# Patient Record
Sex: Female | Born: 1965 | Race: Black or African American | Hispanic: No | Smoking: Current every day smoker
Health system: Southern US, Community
[De-identification: ages and names within clinical notes are randomized; demographics above are authoritative.]

## PROBLEM LIST (undated history)

## (undated) DIAGNOSIS — I10 Essential (primary) hypertension: Secondary | ICD-10-CM

## (undated) DIAGNOSIS — D473 Essential (hemorrhagic) thrombocythemia: Secondary | ICD-10-CM

## (undated) DIAGNOSIS — D75839 Thrombocytosis, unspecified: Secondary | ICD-10-CM

## (undated) DIAGNOSIS — E539 Vitamin B deficiency, unspecified: Secondary | ICD-10-CM

## (undated) DIAGNOSIS — F32A Depression, unspecified: Secondary | ICD-10-CM

## (undated) DIAGNOSIS — Z8041 Family history of malignant neoplasm of ovary: Secondary | ICD-10-CM

## (undated) DIAGNOSIS — D649 Anemia, unspecified: Secondary | ICD-10-CM

## (undated) DIAGNOSIS — E559 Vitamin D deficiency, unspecified: Secondary | ICD-10-CM

## (undated) DIAGNOSIS — F329 Major depressive disorder, single episode, unspecified: Secondary | ICD-10-CM

## (undated) HISTORY — DX: Vitamin D deficiency, unspecified: E55.9

## (undated) HISTORY — DX: Major depressive disorder, single episode, unspecified: F32.9

## (undated) HISTORY — DX: Depression, unspecified: F32.A

## (undated) HISTORY — DX: Thrombocytosis, unspecified: D75.839

## (undated) HISTORY — DX: Family history of malignant neoplasm of ovary: Z80.41

## (undated) HISTORY — DX: Vitamin B deficiency, unspecified: E53.9

## (undated) HISTORY — DX: Essential (primary) hypertension: I10

---
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
## 1989-06-10 HISTORY — PX: CHOLECYSTECTOMY: SHX55

## 1998-06-10 HISTORY — PX: REDUCTION MAMMAPLASTY: SUR839

## 2000-06-10 HISTORY — PX: BREAST SURGERY: SHX581

## 2006-02-12 ENCOUNTER — Ambulatory Visit: Payer: Self-pay | Admitting: Family Medicine

## 2006-08-12 ENCOUNTER — Ambulatory Visit: Payer: Self-pay | Admitting: Family Medicine

## 2008-03-10 ENCOUNTER — Emergency Department: Payer: Self-pay | Admitting: Emergency Medicine

## 2008-12-14 ENCOUNTER — Ambulatory Visit (HOSPITAL_COMMUNITY): Admission: RE | Admit: 2008-12-14 | Discharge: 2008-12-14 | Payer: Self-pay | Admitting: Family Medicine

## 2009-02-03 DIAGNOSIS — F341 Dysthymic disorder: Secondary | ICD-10-CM | POA: Insufficient documentation

## 2009-02-08 ENCOUNTER — Ambulatory Visit: Payer: Self-pay | Admitting: Internal Medicine

## 2009-02-24 ENCOUNTER — Ambulatory Visit: Payer: Self-pay | Admitting: Internal Medicine

## 2009-03-03 DIAGNOSIS — D473 Essential (hemorrhagic) thrombocythemia: Secondary | ICD-10-CM | POA: Insufficient documentation

## 2009-03-10 ENCOUNTER — Ambulatory Visit: Payer: Self-pay | Admitting: Internal Medicine

## 2009-03-13 ENCOUNTER — Emergency Department: Payer: Self-pay | Admitting: Emergency Medicine

## 2009-04-10 ENCOUNTER — Ambulatory Visit: Payer: Self-pay | Admitting: Internal Medicine

## 2009-05-03 DIAGNOSIS — I10 Essential (primary) hypertension: Secondary | ICD-10-CM | POA: Insufficient documentation

## 2009-05-09 HISTORY — PX: SHOULDER SURGERY: SHX246

## 2009-05-10 ENCOUNTER — Ambulatory Visit: Payer: Self-pay | Admitting: Internal Medicine

## 2009-05-19 ENCOUNTER — Encounter: Payer: Self-pay | Admitting: Orthopedic Surgery

## 2009-06-10 ENCOUNTER — Encounter: Payer: Self-pay | Admitting: Orthopedic Surgery

## 2009-06-10 ENCOUNTER — Ambulatory Visit: Payer: Self-pay | Admitting: Internal Medicine

## 2009-07-11 ENCOUNTER — Ambulatory Visit: Payer: Self-pay | Admitting: Internal Medicine

## 2009-07-11 ENCOUNTER — Encounter: Payer: Self-pay | Admitting: Orthopedic Surgery

## 2009-08-08 ENCOUNTER — Encounter: Payer: Self-pay | Admitting: Orthopedic Surgery

## 2009-08-08 ENCOUNTER — Ambulatory Visit: Payer: Self-pay | Admitting: Internal Medicine

## 2009-09-08 ENCOUNTER — Ambulatory Visit: Payer: Self-pay | Admitting: Internal Medicine

## 2009-09-08 ENCOUNTER — Encounter: Payer: Self-pay | Admitting: Orthopedic Surgery

## 2010-10-06 ENCOUNTER — Inpatient Hospital Stay: Payer: Self-pay | Admitting: Psychiatry

## 2011-12-09 ENCOUNTER — Ambulatory Visit: Payer: Self-pay | Admitting: Family Medicine

## 2013-06-07 ENCOUNTER — Emergency Department: Payer: Self-pay | Admitting: Emergency Medicine

## 2013-06-07 LAB — BASIC METABOLIC PANEL
BUN: 5 mg/dL — ABNORMAL LOW (ref 7–18)
Calcium, Total: 9.5 mg/dL (ref 8.5–10.1)
Chloride: 106 mmol/L (ref 98–107)
Creatinine: 0.63 mg/dL (ref 0.60–1.30)
EGFR (African American): >60
Potassium: 4.1 mmol/L (ref 3.5–5.1)

## 2013-06-07 LAB — CBC
HGB: 11.8 g/dL — ABNORMAL LOW (ref 12.0–16.0)
MCV: 78 fL — ABNORMAL LOW (ref 80–100)
RDW: 17.9 % — ABNORMAL HIGH (ref 11.5–14.5)

## 2013-06-07 LAB — TROPONIN I: Troponin-I: <0.02 ng/mL

## 2014-03-30 LAB — HEMOGLOBIN A1C: HEMOGLOBIN A1C: 6 % (ref 4.0–6.0)

## 2014-12-01 DIAGNOSIS — N926 Irregular menstruation, unspecified: Secondary | ICD-10-CM | POA: Insufficient documentation

## 2014-12-01 DIAGNOSIS — D519 Vitamin B12 deficiency anemia, unspecified: Secondary | ICD-10-CM | POA: Insufficient documentation

## 2014-12-01 DIAGNOSIS — A5900 Urogenital trichomoniasis, unspecified: Secondary | ICD-10-CM | POA: Insufficient documentation

## 2014-12-01 DIAGNOSIS — E559 Vitamin D deficiency, unspecified: Secondary | ICD-10-CM | POA: Insufficient documentation

## 2014-12-01 DIAGNOSIS — R739 Hyperglycemia, unspecified: Secondary | ICD-10-CM | POA: Insufficient documentation

## 2014-12-01 DIAGNOSIS — D509 Iron deficiency anemia, unspecified: Secondary | ICD-10-CM | POA: Insufficient documentation

## 2014-12-02 ENCOUNTER — Encounter: Payer: Self-pay | Admitting: Family Medicine

## 2014-12-02 ENCOUNTER — Ambulatory Visit (INDEPENDENT_AMBULATORY_CARE_PROVIDER_SITE_OTHER): Payer: 59 | Admitting: Family Medicine

## 2014-12-02 VITALS — BP 152/90 | HR 76 | Temp 98.4°F | Resp 16 | Ht 68.0 in | Wt 231.0 lb

## 2014-12-02 DIAGNOSIS — R202 Paresthesia of skin: Secondary | ICD-10-CM

## 2014-12-02 LAB — POCT GLYCOSYLATED HEMOGLOBIN (HGB A1C): Hemoglobin A1C: 5.6

## 2014-12-02 NOTE — Patient Instructions (Signed)
F/u with podiatry as scheduled

## 2014-12-02 NOTE — Progress Notes (Signed)
Subjective:     Patient ID: Anna Bates, female   DOB: [birth year], 49 y.o.   MRN: [id number]  HPI  Chief Complaint  Patient presents with  . Foot Problem    Right foot pain x several months, intermittently. Pt states there has been associated swelling. States it's a "needles and pins" feeling.  States she has appointment pending with podiatry next week ([place]). Reports hx of pre-diabetes. Reports minimal relief with ibuprofen.   Review of Systems  Musculoskeletal:       Reports hx of right mid-foot fracture       Objective:   Physical Exam  Constitutional: She appears well-developed and well-nourished. No distress.  Cardiovascular:  Right pedal pulses intact - Musculoskeletal: She exhibits no edema or tenderness (DF/PF 5/5, ankle ligaments stable).  SLR to 90 degrees without discomfort  Neurological:  Sensation to PP diminished in her right lateral foot from the third toe laterally       Assessment:     1. Paresthesia of right foot - POCT HgB A1C    Plan:    Proceed with podiatry evaluation

## 2014-12-08 ENCOUNTER — Ambulatory Visit: Payer: 59 | Admitting: Podiatry

## 2015-03-29 ENCOUNTER — Ambulatory Visit: Payer: 59

## 2015-04-15 ENCOUNTER — Encounter: Payer: Self-pay | Admitting: Emergency Medicine

## 2015-04-15 ENCOUNTER — Emergency Department
Admission: EM | Admit: 2015-04-15 | Discharge: 2015-04-15 | Disposition: A | Discharge status: Home / Self Care | Payer: PRIVATE HEALTH INSURANCE | Attending: Emergency Medicine | Admitting: Emergency Medicine

## 2015-04-15 DIAGNOSIS — Y9389 Activity, other specified: Secondary | ICD-10-CM | POA: Diagnosis not present

## 2015-04-15 DIAGNOSIS — X58XXXA Exposure to other specified factors, initial encounter: Secondary | ICD-10-CM | POA: Diagnosis not present

## 2015-04-15 DIAGNOSIS — S46001A Unspecified injury of muscle(s) and tendon(s) of the rotator cuff of right shoulder, initial encounter: Secondary | ICD-10-CM | POA: Diagnosis not present

## 2015-04-15 DIAGNOSIS — Z720 Tobacco use: Secondary | ICD-10-CM | POA: Diagnosis not present

## 2015-04-15 DIAGNOSIS — Y9289 Other specified places as the place of occurrence of the external cause: Secondary | ICD-10-CM | POA: Diagnosis not present

## 2015-04-15 DIAGNOSIS — S4991XA Unspecified injury of right shoulder and upper arm, initial encounter: Secondary | ICD-10-CM | POA: Diagnosis present

## 2015-04-15 DIAGNOSIS — Y998 Other external cause status: Secondary | ICD-10-CM | POA: Insufficient documentation

## 2015-04-15 DIAGNOSIS — I10 Essential (primary) hypertension: Secondary | ICD-10-CM | POA: Diagnosis not present

## 2015-04-15 MED ORDER — TRAMADOL HCL 50 MG PO TABS: 50.0000 mg | ORAL_TABLET | Freq: Four times a day (QID) | ORAL | Status: DC | PRN

## 2015-04-15 MED ORDER — PREDNISONE 10 MG (21) PO TBPK: ORAL_TABLET | ORAL | Status: DC

## 2015-04-15 NOTE — ED Notes (Signed)
Pt reports rotator cuff injury in 2010 and re-injuring shoulder last month.

## 2015-04-15 NOTE — ED Provider Notes (Signed)
[facility] Emergency Department Provider Note ____________________________________________  Time seen: Approximately 1:02 PM  I have reviewed the triage vital signs and the nursing notes.   HISTORY  Chief Complaint Shoulder Injury   HPI Anna Bates is a 49 y.o. female who presents to the emergency department for evaluation of right shoulder pain. She states that she had a rotator cuff repair several years ago. About a month ago she reinjured the shoulder while at work and has been on lifting restrictions and light duty since. She is awaiting her follow-up with orthopedics, but does not yet have an appointment. 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  History reviewed. No pertinent past medical history.  Patient Active Problem List   Diagnosis Date Noted  . Vitamin B12 deficiency anemia 12/01/2014  . Blood glucose elevated 12/01/2014  . Avitaminosis D 12/01/2014  . GU infection, trichomonal 12/01/2014  . Irregular bleeding 12/01/2014  . Anemia, iron deficiency 12/01/2014  . Essential (primary) hypertension 05/03/2009  . 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 Depression, neurotic 02/03/2009    Past Surgical History  Procedure Laterality Date  . Shoulder surgery Right 05/09/2009  . Breast surgery  2002    Breast reduction -both  . Cholecystectomy  1991  . Cesarean section  1990  . Hernia repair  1980    Current Outpatient Rx  Name  Route  Sig  Dispense  Refill  . predniSONE (STERAPRED UNI-PAK 21 TAB) 10 MG (21) TBPK tablet      Take 6 tablets on day 1 Take 5 tablets on day 2 Take 4 tablets on day 3 Take 3 tablets on day 4 Take 2 tablets on day 5 Take 1 tablet on day 6   21 tablet   0   . traMADol (ULTRAM) 50 MG tablet   Oral   Take 1 tablet (50 mg  total) by mouth every 6 (six) hours as needed.   12 tablet   0     Allergies Review of patient's allergies indicates no known allergies.  Family History  Problem Relation Age of Onset  . Hypertension Other   . Diabetes Other     Diabetes Mellitus type 2    Social History Social History  Substance Use Topics  . Smoking status: Current Every Day Smoker -- 0.50 packs/day for 30 years  . Smokeless tobacco: None  . Alcohol Use: No    Review of Systems Constitutional: No recent illness. Eyes: No visual changes. ENT: No sore throat. Cardiovascular: Denies chest pain or palpitations. Respiratory: Denies shortness of breath. Gastrointestinal: No abdominal pain.  Genitourinary: Negative for dysuria. Musculoskeletal: Pain in right shoulder Skin: Negative for rash. Neurological: Negative for headaches, focal weakness or numbness. 10-point ROS otherwise negative.  ____________________________________________   PHYSICAL EXAM:  VITAL SIGNS: ED Triage Vitals  Enc Vitals Group     BP 04/15/15 1244 174/83 mmHg     Pulse Rate 04/15/15 1244 72     Resp 04/15/15 1244 20     Temp 04/15/15 1244 98.2 F (36.8 C)     Temp Source 04/15/15 1244 Oral     SpO2 --      Weight 04/15/15 1244 234 lb (106.142 kg)     Height 04/15/15 1244 5\' 8"  (1.727 m)     Head Cir --  Peak Flow --      Pain Score 04/15/15 1244 8     Pain Loc --      Pain Edu? --      Excl. in [place]? --     Constitutional: Alert and oriented. Well appearing and in no acute distress. Eyes: Conjunctivae are normal. EOMI. Head: Atraumatic. Nose: No congestion/rhinnorhea. Neck: No stridor.  Respiratory: Normal respiratory effort.   Musculoskeletal: 5+ strength in bilateral upper extremities against resistance. Active range of motion to about 45 of the right shoulder, then patient complains of pain in the superior aspect of the right shoulder that radiates into the right trapezius area. She reports pain with posterior  abduction of the extremity. Neurologic:  Normal speech and language. No gross focal neurologic deficits are appreciated. Speech is normal. No gait instability. Skin:  Skin is warm, dry and intact. Atraumatic. Psychiatric: Mood and affect are normal. Speech and behavior are normal.  ____________________________________________   LABS (all labs ordered are listed, but only abnormal results are displayed)  Labs Reviewed - No data to display ____________________________________________  RADIOLOGY  Not indicated. ____________________________________________   PROCEDURES  Procedure(s) performed: None   ____________________________________________   INITIAL IMPRESSION / ASSESSMENT AND PLAN / ED COURSE  Pertinent labs & imaging results that were available during my care of the patient were reviewed by me and considered in my medical decision making (see chart for details).  Patient is to follow up with orthopedics. She was advised to return to the ER for symptoms that change or worsen if unable to see PCP or the specialist. ____________________________________________   FINAL CLINICAL IMPRESSION(S) / ED DIAGNOSES  Final diagnoses:  Rotator cuff injury, right, initial encounter       Victorino Dike, FNP 04/15/15 1805  Daymon Larsen, MD 04/19/15 [phone]

## 2015-04-15 NOTE — ED Notes (Signed)
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  Pt reports old injury to right shoulder. Reports pain with ROM now.

## 2015-04-15 NOTE — Discharge Instructions (Signed)
Rotator Cuff Injury °Rotator cuff injury is any type of injury to the set of muscles and tendons that make up the stabilizing unit of your shoulder. This unit holds the ball of your upper arm bone (humerus) in the socket of your shoulder blade (scapula).  °CAUSES °Injuries to your rotator cuff most commonly come from sports or activities that cause your arm to be moved repeatedly over your head. Examples of this include throwing, weight lifting, swimming, or racquet sports. Long lasting (chronic) irritation of your rotator cuff can cause soreness and swelling (inflammation), bursitis, and eventual damage to your tendons, such as a tear (rupture). °SIGNS AND SYMPTOMS °Acute rotator cuff tear: °· Sudden tearing sensation followed by severe pain shooting from your upper shoulder down your arm toward your elbow. °· Decreased range of motion of your shoulder because of pain and muscle spasm. °· Severe pain. °· Inability to raise your arm out to the side because of pain and loss of muscle power (large tears). °Chronic rotator cuff tear: °· Pain that usually is worse at night and may interfere with sleep. °· Gradual weakness and decreased shoulder motion as the pain worsens. °· Decreased range of motion. °Rotator cuff tendinitis:  °· Deep ache in your shoulder and the outside upper arm over your shoulder. °· Pain that comes on gradually and becomes worse when lifting your arm to the side or turning it inward. °DIAGNOSIS °Rotator cuff injury is diagnosed through a medical history, physical exam, and imaging exam. The medical history helps determine the type of rotator cuff injury. Your health care provider will look at your injured shoulder, feel the injured area, and ask you to move your shoulder in different positions. X-ray exams typically are done to rule out other causes of shoulder pain, such as fractures. MRI is the exam of choice for the most severe shoulder injuries because the images show muscles and tendons.    °TREATMENT  °Chronic tear: °· Medicine for pain, such as acetaminophen or ibuprofen. °· Physical therapy and range-of-motion exercises may be helpful in maintaining shoulder function and strength. °· Steroid injections into your shoulder joint. °· Surgical repair of the rotator cuff if the injury does not heal with noninvasive treatment. °Acute tear: °· Anti-inflammatory medicines such as ibuprofen and naproxen to help reduce pain and swelling. °· A sling to help support your arm and rest your rotator cuff muscles. Long-term use of a sling is not advised. It may cause significant stiffening of the shoulder joint. °· Surgery may be considered within a few weeks, especially in younger, active people, to return the shoulder to full function. °· Indications for surgical treatment include the following: °¨ Age younger than 60 years. °¨ Rotator cuff tears that are complete. °¨ Physical therapy, rest, and anti-inflammatory medicines have been used for 6-8 weeks, with no improvement. °¨ Employment or sporting activity that requires constant shoulder use. °Tendinitis: °· Anti-inflammatory medicines such as ibuprofen and naproxen to help reduce pain and swelling. °· A sling to help support your arm and rest your rotator cuff muscles. Long-term use of a sling is not advised. It may cause significant stiffening of the shoulder joint. °· Severe tendinitis may require: °¨ Steroid injections into your shoulder joint. °¨ Physical therapy. °¨ Surgery. °HOME CARE INSTRUCTIONS  °· Apply ice to your injury: °¨ Put ice in a plastic bag. °¨ Place a towel between your skin and the bag. °¨ Leave the ice on for 20 minutes, 2-3 times a day. °· If you   have a shoulder immobilizer (sling and straps), wear it until told otherwise by your health care provider. °· You may want to sleep on several pillows or in a recliner at night to lessen swelling and pain. °· Only take over-the-counter or prescription medicines for pain, discomfort, or fever as  directed by your health care provider. °· Do simple hand squeezing exercises with a soft rubber ball to decrease hand swelling. °SEEK MEDICAL CARE IF:  °· Your shoulder pain increases, or new pain or numbness develops in your arm, hand, or fingers. °· Your hand or fingers are colder than your other hand. °SEEK IMMEDIATE MEDICAL CARE IF:  °· Your arm, hand, or fingers are numb or tingling. °· Your arm, hand, or fingers are increasingly swollen and painful, or they turn white or blue. °MAKE SURE YOU: °· Understand these instructions. °· Will watch your condition. °· Will get help right away if you are not doing well or get worse. °  °This information is not intended to replace advice given to you by your health care provider. Make sure you discuss any questions you have with your health care provider. °  °Document Released: 05/24/2000 Document Revised: 06/01/2013 Document Reviewed: 01/06/2013 °Elsevier Interactive Patient Education ©2016 [employer]. ° °

## 2015-05-16 ENCOUNTER — Ambulatory Visit: Payer: PRIVATE HEALTH INSURANCE | Attending: Orthopedic Surgery

## 2015-05-16 DIAGNOSIS — M25511 Pain in right shoulder: Secondary | ICD-10-CM | POA: Diagnosis present

## 2015-05-16 NOTE — Patient Instructions (Signed)
[email or web address] Doorway stretch 30s x 2 sidelying ER 3x10 Scapular retraction 5s x 10

## 2015-05-16 NOTE — Therapy (Signed)
[place] MAIN [facility] SERVICES [address] [place], [place], [address] Phone: [phone]   Fax:  [phone]  Physical Therapy Evaluation  Patient Details  Name: Anna Bates MRN: [id number] Date of Birth: 1965-06-27 Referring Provider: dr. Onnie Graham  Encounter Date: 05/16/2015      PT End of Session - 05/16/15 0919    Visit Number 1   Number of Visits 9   Date for PT Re-Evaluation 06/13/15   PT Start Time 0825   PT Stop Time 0910   PT Time Calculation (min) 45 min   Activity Tolerance Patient tolerated treatment well      History reviewed. No pertinent past medical history.  Past Surgical History  Procedure Laterality Date  . Shoulder surgery Right 05/09/2009  . Breast surgery  2002    Breast reduction -both  . Cholecystectomy  1991  . Cesarean section  1990  . Hernia repair  1980    There were no vitals filed for this visit.  Visit Diagnosis:  Pain in joint of right shoulder - Plan: PT plan of care cert/re-cert      Subjective Assessment - 05/16/15 0831    Subjective pt had R RTC repair 2010 with SAR and biceps tenodesis. 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 pt reports it was not getting better and was referred to an ortho, but did not see one. Pt reports she again injured her shoulder Nov 5th she reports trying to prevent a resident from falling and injured her shoulder again. Pt did see an orthopedist on Nov 23 where he did Xrays which showed some degenerative changes. pt got 2 cortisone injections which helped until she went back to work Nov 30th. Pt is still on light duty lifting no more than 10lbs and no overhead. pt reports she is not able to sleep on her shoulder, pt reportings typing from an elevated surface hurts, as well as pushing pulling and raising her arms up overhead.  pt describes her pain as burning on top  of her shoulder. pt  denies any neck pain. pt denies any numbness/tingling. pt has not heard any popping.   Diagnostic tests Xray   Currently in Pain? Yes   Pain Score 4    Pain Location --  R top of shoulder    Pain Descriptors / Indicators Aching;Burning   Aggravating Factors  moving the arm   Pain Relieving Factors ice, rest            [facility] PT Assessment - 05/16/15 0001    Assessment   Medical Diagnosis R shoulder strain   Referring Provider dr. Onnie Graham   Onset Date/Surgical Date 04/15/15   Next MD Visit 05/31/15   Restrictions   Weight Bearing Restrictions No   Prior Function   Vocation Full time employment   Vocation Requirements --  lifting/pulling patients   Sensation   Light Touch Appears Intact   Coordination   Gross Motor Movements are Fluid and Coordinated Yes   Fine Motor Movements are Fluid and Coordinated Yes   ROM / Strength   AROM / PROM / Strength AROM;PROM;Strength   AROM   Overall AROM Comments L shoulder AROM WNL. R shoulder flexion 160 deg, abduction 160deg, ER 60 deg, IR to T12   PROM   Overall PROM  Within functional limits for tasks performed   Strength  Overall Strength Comments L shoulder flexion, abduction, IR 5/5. ER 4+/5. L shoulder flexion 4-/5, abduction4-/5, ER 4-/5, IR4/5. scapular muscles 4-/5 (low and mid trap)   Flexibility   Soft Tissue Assessment /Muscle Length --   Special Tests    Special Tests Cervical;Rotator Cuff Impingement;Biceps/Labral Tests   Cervical Tests Spurling's   Rotator Cuff Impingment tests Neer impingement test;Hawkins- Kennedy test;Lift- off test;Belly Press;Hornblowers Sign;Empty Can test;Full Can test;Drop Arm test;Painful Arc of Motion   Biceps/Labral tests O' Brien's Test   Spurling's   Findings Negative   Neer Impingement test    Findings Negative   Side Right   Hawkins-Kennedy test   Findings Negative   Side Right   Lift-Off test   Findings Negative   Side Right   Belly Press   Findings  Negative   Side Right   Hornblowers Sign   Findings Negative   Empty Can test   Findings Positive   Side Right   Full Can test   Findings Negative   Drop Arm test   Findings Negative   Side Right   Painful Arc of Motion   Findings Positive   Side Right   O'Brien's Test   Findings Negative     T spine palpation: non tender but hypomobile                       PT Education - 05/16/15 0918    Education provided Yes   Education Details plan of care, impingement, posture ed, HEP   Person(s) Educated Patient   Methods Explanation   Comprehension Verbalized understanding             PT Long Term Goals - 05/16/15 0921    PT LONG TERM GOAL #1   Title pt will reduce work dash disability score to <25% for improved work function    Baseline 44% disability 05/16/15   Time 4   Period Weeks   Status New   PT LONG TERM GOAL #2   Title pt will be able to lift 5lb item overhead x 5 with pain less than 3/10   Time 4   Period Weeks   Status New   PT LONG TERM GOAL #3   Title pt will demonstrate painfree full ROM of the R shoulder    Time 4   Period Weeks   Status New   PT LONG TERM GOAL #4   Title pt will be able to sleep on the R shoulder without being awakened by pain at night    Time 4   Period Weeks   Status New               Plan - 05/16/15 0919    Clinical Impression Statement pt presents with R shoulder pain and weakness after multiple injuries. pt has painful arch and painful palpation of the sub acromial space and RTC tendon insertions. pt has shoulder and scapular weakness R>L and poor posture. pt would benefit from skilled PT services to improve the stated deficits to maximize function and reduce pain.   Pt will benefit from skilled therapeutic intervention in order to improve on the following deficits Decreased strength;Improper body mechanics;Pain;Impaired UE functional use;Hypomobility;Impaired flexibility;Decreased range of motion    Rehab Potential Good   Clinical Impairments Affecting Rehab Potential hx of R shoulder injury   PT Frequency 2x / week   PT Duration 4 weeks   PT Treatment/Interventions ADLs/Self Care Home Management;Aquatic Therapy;Electrical Stimulation;Iontophoresis 4mg /ml  Dexamethasone;Moist Heat;Ultrasound;Patient/family education;Neuromuscular re-education;Therapeutic exercise;Therapeutic activities;Passive range of motion;Dry needling   PT Next Visit Plan progress therex         Problem List Patient Active Problem List   Diagnosis Date Noted  . Vitamin B12 deficiency anemia 12/01/2014  . Blood glucose elevated 12/01/2014  . Avitaminosis D 12/01/2014  . GU infection, trichomonal 12/01/2014  . Irregular bleeding 12/01/2014  . Anemia, iron deficiency 12/01/2014  . Essential (primary) hypertension 05/03/2009  . 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 Depression, neurotic 02/03/2009   Gorden Harms. Marise Knapper, PT, DPT [phone]  Jacqulene Huntley 05/16/2015, 9:26 AM  [place] MAIN [facility] SERVICES [address] [place], [place], [address] Phone: [phone]   Fax:  [phone]  Name: DESSIREE PORTALATIN MRN: [id number] Date of Birth: 1965/12/16

## 2015-05-18 ENCOUNTER — Ambulatory Visit: Payer: PRIVATE HEALTH INSURANCE

## 2015-05-18 DIAGNOSIS — M25511 Pain in right shoulder: Secondary | ICD-10-CM

## 2015-05-18 NOTE — Therapy (Signed)
[place] MAIN [facility] SERVICES [address] [place], [place], [address] Phone: [phone]   Fax:  [phone]  Physical Therapy Treatment  Patient Details  Name: Anna Bates MRN: [id number] Date of Birth: 05/01/1966 Referring Provider: dr. Onnie Graham  Encounter Date: 05/18/2015      PT End of Session - 05/18/15 1510    Visit Number 2   Number of Visits 9   Date for PT Re-Evaluation 06/13/15   PT Start Time 0803   PT Stop Time 0845   PT Time Calculation (min) 42 min   Activity Tolerance Patient tolerated treatment well      History reviewed. No pertinent past medical history.  Past Surgical History  Procedure Laterality Date  . Shoulder surgery Right 05/09/2009  . Breast surgery  2002    Breast reduction -both  . Cholecystectomy  1991  . Cesarean section  1990  . Hernia repair  1980    There were no vitals filed for this visit.  Visit Diagnosis:  Pain in joint of right shoulder      Subjective Assessment - 05/18/15 1508    Subjective pt reports yesterday was the worst day she has had in a while. pt reports she thinks it was due to her arm being manipulated in the PT eval. She has been working on her posture.    Diagnostic tests Xray   Pain Score 5    Pain Location --  R shoulder        Therex: Repeated T spine extension over chair x 10  Low row: yellow band 3x10 IR with yellow band 2x10 sidelying ER 3x10  Pt requires min verbal and tactile cues for proper exercise performance   Manual therapy: Extensive  Manual therapy to biceps and deltoid muscle belly including Soft tissue massage, cross friction massage Mobilization with movement into flexion and IR/ER with posterior inferior GH glide. 2x10 each                                PT Long Term Goals - 05/16/15 0921    PT LONG TERM GOAL #1   Title pt will reduce work dash disability score to <25% for improved work function    Baseline  44% disability 05/16/15   Time 4   Period Weeks   Status New   PT LONG TERM GOAL #2   Title pt will be able to lift 5lb item overhead x 5 with pain less than 3/10   Time 4   Period Weeks   Status New   PT LONG TERM GOAL #3   Title pt will demonstrate painfree full ROM of the R shoulder    Time 4   Period Weeks   Status New   PT LONG TERM GOAL #4   Title pt will be able to sleep on the R shoulder without being awakened by pain at night    Time 4   Period Weeks   Status New               Plan - 05/18/15 1510    Clinical Impression Statement pt has increased tone and trigger points in the biceps likely due to over use as a secondary shoulder flexor. progressed therex/HEP today for scapular and RTC strengthening. pt found the exercises a little uncomfortable, but tolerable. pt is very tender over sub acromial space.    Pt will benefit from  skilled therapeutic intervention in order to improve on the following deficits Decreased strength;Improper body mechanics;Pain;Impaired UE functional use;Hypomobility;Impaired flexibility;Decreased range of motion   Rehab Potential Good   Clinical Impairments Affecting Rehab Potential hx of R shoulder injury   PT Frequency 2x / week   PT Duration 4 weeks   PT Treatment/Interventions ADLs/Self Care Home Management;Aquatic Therapy;Electrical Stimulation;Iontophoresis 4mg /ml Dexamethasone;Moist Heat;Ultrasound;Patient/family education;Neuromuscular re-education;Therapeutic exercise;Therapeutic activities;Passive range of motion;Dry needling        Problem List Patient Active Problem List   Diagnosis Date Noted  . Vitamin B12 deficiency anemia 12/01/2014  . Blood glucose elevated 12/01/2014  . Avitaminosis D 12/01/2014  . GU infection, trichomonal 12/01/2014  . Irregular bleeding 12/01/2014  . Anemia, iron deficiency 12/01/2014  . Essential (primary) hypertension 05/03/2009  . 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  Depression, neurotic 02/03/2009   Gorden Harms. Cassaundra Rasch, PT, DPT [phone]  Cherylene Ferrufino 05/18/2015, 3:12 PM  [place] MAIN [facility] SERVICES [address] [place], [place], [address] Phone: [phone]   Fax:  [phone]  Name: Anna Bates MRN: [id number] Date of Birth: [birth year]

## 2015-05-18 NOTE — Patient Instructions (Signed)
[email or web address] Repeated T spine extension over chair x 10  Low row: yellow band 3x10 IR with yellow band 2x10 sidelying ER 3x10

## 2015-05-24 ENCOUNTER — Ambulatory Visit: Payer: PRIVATE HEALTH INSURANCE

## 2015-05-24 DIAGNOSIS — M25511 Pain in right shoulder: Secondary | ICD-10-CM

## 2015-05-24 NOTE — Therapy (Signed)
[place] MAIN [facility] SERVICES [address] [place], [place], [address] Phone: [phone]   Fax:  [phone]  Physical Therapy Treatment  Patient Details  Name: Anna Bates MRN: [id number] Date of Birth: 04/15/1966 Referring Provider: dr. Onnie Graham  Encounter Date: 05/24/2015      PT End of Session - 05/24/15 1142    Visit Number 3   Number of Visits 9   Date for PT Re-Evaluation 06/13/15   PT Start Time 0915   PT Stop Time 0955   PT Time Calculation (min) 40 min   Activity Tolerance Patient tolerated treatment well      History reviewed. No pertinent past medical history.  Past Surgical History  Procedure Laterality Date  . Shoulder surgery Right 05/09/2009  . Breast surgery  2002    Breast reduction -both  . Cholecystectomy  1991  . Cesarean section  1990  . Hernia repair  1980    There were no vitals filed for this visit.  Visit Diagnosis:  Pain in joint of right shoulder      Subjective Assessment - 05/24/15 1142    Subjective pt reports her arm has been feeling better in general since she hasnt worked the past few days. she reports HEP compliance    Diagnostic tests Xray   Currently in Pain? Yes   Pain Score 1    Pain Location --  R shoulder pain      Prone row, shoulder horiz abduction, shoulder extension and flexion, min cues for scapular control 2x10 each Pt requires min verbal and tactile cues for proper exercise performance  Manual therapy: Extensive Manual therapy to biceps and supraspinatus muscle belly including Soft tissue massage, cross friction massage Mobilization with movement into flexion and IR/ER with posterior inferior GH glide. 2x10 each CPA mobs to T spine T1-T12 grade III-IV 2 bouts of 30s each                                                PT Education - 05/24/15 1142    Education provided Yes   Education Details prognosis   Person(s)  Educated Patient   Methods Explanation   Comprehension Verbalized understanding             PT Long Term Goals - 05/16/15 0921    PT LONG TERM GOAL #1   Title pt will reduce work dash disability score to <25% for improved work function    Baseline 44% disability 05/16/15   Time 4   Period Weeks   Status New   PT LONG TERM GOAL #2   Title pt will be able to lift 5lb item overhead x 5 with pain less than 3/10   Time 4   Period Weeks   Status New   PT LONG TERM GOAL #3   Title pt will demonstrate painfree full ROM of the R shoulder    Time 4   Period Weeks   Status New   PT LONG TERM GOAL #4   Title pt will be able to sleep on the R shoulder without being awakened by pain at night    Time 4   Period Weeks   Status New               Plan - 05/24/15 1142    Clinical Impression Statement  crepitus and pain noted over R supraspinatus tendon and muscle belly. pt only can tolerate light massage over those areas. imporved T spine mobility following mobs, pt did not report reduced pain with shoulder AROM following however. progerssed stregnthening today, pt has pain with prone shoulder flexion    Pt will benefit from skilled therapeutic intervention in order to improve on the following deficits Decreased strength;Improper body mechanics;Pain;Impaired UE functional use;Hypomobility;Impaired flexibility;Decreased range of motion   Rehab Potential Good   Clinical Impairments Affecting Rehab Potential hx of R shoulder injury   PT Frequency 2x / week   PT Duration 4 weeks   PT Treatment/Interventions ADLs/Self Care Home Management;Aquatic Therapy;Electrical Stimulation;Iontophoresis 4mg /ml Dexamethasone;Moist Heat;Ultrasound;Patient/family education;Neuromuscular re-education;Therapeutic exercise;Therapeutic activities;Passive range of motion;Dry needling        Problem List Patient Active Problem List   Diagnosis Date Noted  . Vitamin B12 deficiency anemia 12/01/2014  .  Blood glucose elevated 12/01/2014  . Avitaminosis D 12/01/2014  . GU infection, trichomonal 12/01/2014  . Irregular bleeding 12/01/2014  . Anemia, iron deficiency 12/01/2014  . Essential (primary) hypertension 05/03/2009  . 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 Depression, neurotic 02/03/2009   Gorden Harms. Abrar Bilton, PT, DPT [id number]  Auren Valdes 05/24/2015, 11:44 AM  [place] MAIN [facility] SERVICES [address] [place], [place], [address] Phone: [phone]   Fax:  [phone]  Name: Anna Bates MRN: [id number] Date of Birth: 1965-09-03

## 2015-05-25 ENCOUNTER — Ambulatory Visit: Payer: PRIVATE HEALTH INSURANCE

## 2015-05-25 DIAGNOSIS — M25511 Pain in right shoulder: Secondary | ICD-10-CM

## 2015-05-25 NOTE — Therapy (Signed)
[place] MAIN [facility] SERVICES [address] [place], [place], [address] Phone: [phone]   Fax:  [phone]  Physical Therapy Treatment  Patient Details  Name: Anna Bates MRN: [id number] Date of Birth: Apr 16, 1966 Referring Provider: dr. Onnie Graham  Encounter Date: 05/25/2015      PT End of Session - 05/25/15 0850    Visit Number 4   Number of Visits 9   Date for PT Re-Evaluation 06/13/15   PT Start Time 0809   PT Stop Time 0847   PT Time Calculation (min) 38 min   Activity Tolerance Patient tolerated treatment well  Stopped session as patient began to have "throbbing" sensation in R shoulder   Behavior During Therapy [facility] for tasks assessed/performed      No past medical history on file.  Past Surgical History  Procedure Laterality Date  . Shoulder surgery Right 05/09/2009  . Breast surgery  2002    Breast reduction -both  . Cholecystectomy  1991  . Cesarean section  1990  . Hernia repair  1980    There were no vitals filed for this visit.  Visit Diagnosis:  Pain in joint of right shoulder      Subjective Assessment - 05/25/15 0811    Subjective Patient reports she continues to have pain at work. She had increased symptoms after session yesterday (went to 8/10, decreased over the course of the day). She reports she really only has pain performing work related activities.    Diagnostic tests Xray   Patient Stated Goals To return to work pain free    Currently in Pain? Yes   Pain Score 2    Pain Location Shoulder   Pain Orientation Right   Pain Descriptors / Indicators Aching;Burning   Pain Type Chronic pain   Aggravating Factors  Overhead movement, lifting up boxers/pants.    Pain Relieving Factors Below horizontal movements       TherEx   Prone rows x 10 BW, x 10 for 3 sets with 3# DB   Prone shoulder extensions x 10 at bodyweight, 1# DB x 10 repetitions for 3 sets (no pain)   Standing Cable Rows x7.5#,  x12.5# for 10 repetitions    Seated ER with Red t-band x 10 at no elevation, x 10 at 30 degrees flexion (discomfort), x 10 with yellow t-band at 30 degrees   Seated thoracic extensions with 5 second holds x 10 repetitions   Chest press in supine with AAROM to get to 90 degrees of flexion (felt discomfort afterwards)                             PT Education - 05/25/15 0849    Education provided Yes   Education Details Lifting mechanics, pulling mechanics, alternative strategies to minimize humeral movement in lifting.    Person(s) Educated Patient   Methods Explanation;Demonstration   Comprehension Verbalized understanding;Returned demonstration             PT Long Term Goals - 05/16/15 0921    PT LONG TERM GOAL #1   Title pt will reduce work dash disability score to <25% for improved work function    Baseline 44% disability 05/16/15   Time 4   Period Weeks   Status New   PT LONG TERM GOAL #2   Title pt will be able to lift 5lb item overhead x 5 with pain less than 3/10   Time  4   Period Weeks   Status New   PT LONG TERM GOAL #3   Title pt will demonstrate painfree full ROM of the R shoulder    Time 4   Period Weeks   Status New   PT LONG TERM GOAL #4   Title pt will be able to sleep on the R shoulder without being awakened by pain at night    Time 4   Period Weeks   Status New               Plan - 05/25/15 0850    Clinical Impression Statement Patient reports pain pattern consistent with inflammatory pain (relieved with ice, only with active movement, sharp pain for prolonged time which subsides). Patient progressed in strengthening and pulling mechanics today as she initially demonstrates anterior humeral translation in pulling, rather than scapular retraction (likely contributing to pathology). Patient would benefit from continued t-spine mobility and posterior cuff strengthening/lifting mechanics.    Pt will benefit from skilled  therapeutic intervention in order to improve on the following deficits Decreased strength;Improper body mechanics;Pain;Impaired UE functional use;Hypomobility;Impaired flexibility;Decreased range of motion   Rehab Potential Good   Clinical Impairments Affecting Rehab Potential hx of R shoulder injury   PT Frequency 2x / week   PT Duration 4 weeks   PT Treatment/Interventions ADLs/Self Care Home Management;Aquatic Therapy;Electrical Stimulation;Iontophoresis 4mg /ml Dexamethasone;Moist Heat;Ultrasound;Patient/family education;Neuromuscular re-education;Therapeutic exercise;Therapeutic activities;Passive range of motion;Dry needling   PT Next Visit Plan Progress ther-ex with weights as appropriate. Increase ROM with activities, possibly PNF rhythmic stabilization.    Consulted and Agree with Plan of Care Patient        Problem List Patient Active Problem List   Diagnosis Date Noted  . Vitamin B12 deficiency anemia 12/01/2014  . Blood glucose elevated 12/01/2014  . Avitaminosis D 12/01/2014  . GU infection, trichomonal 12/01/2014  . Irregular bleeding 12/01/2014  . Anemia, iron deficiency 12/01/2014  . Essential (primary) hypertension 05/03/2009  . 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 Depression, neurotic 02/03/2009    Kerman Passey, PT, DPT    05/25/2015, 8:59 AM  [place] MAIN [facility] SERVICES [address] [place], [place], [address] Phone: [phone]   Fax:  [phone]  Name: LISSETT FOUGHT MRN: [id number] Date of Birth: 01-10-1966

## 2015-05-29 ENCOUNTER — Ambulatory Visit: Payer: PRIVATE HEALTH INSURANCE

## 2015-05-29 DIAGNOSIS — M25511 Pain in right shoulder: Secondary | ICD-10-CM | POA: Diagnosis not present

## 2015-05-29 NOTE — Therapy (Signed)
[place] MAIN [facility] SERVICES [address] [place], [place], [address] Phone: [phone]   Fax:  [phone]  Physical Therapy Treatment  Patient Details  Name: Anna Bates MRN: [id number] Date of Birth: 1965/08/30 Referring Provider: dr. Onnie Graham  Encounter Date: 05/29/2015      PT End of Session - 05/29/15 0923    Visit Number 5   Number of Visits 9   Date for PT Re-Evaluation 06/13/15   PT Start Time 0850   PT Stop Time 0920   PT Time Calculation (min) 30 min   Activity Tolerance Patient tolerated treatment well  Stopped session as patient began to have "throbbing" sensation in R shoulder   Behavior During Therapy [facility] for tasks assessed/performed      History reviewed. No pertinent past medical history.  Past Surgical History  Procedure Laterality Date  . Shoulder surgery Right 05/09/2009  . Breast surgery  2002    Breast reduction -both  . Cholecystectomy  1991  . Cesarean section  1990  . Hernia repair  1980    There were no vitals filed for this visit.  Visit Diagnosis:  Pain in joint of right shoulder      Subjective Assessment - 05/29/15 0922    Subjective pt reports she was sore for about 2 hours after last session, but worked relatively with less pain fri and saturday. she had to do a lot of lifting at work yesterday so that was more painful. reports about 2/10 pain today. she does report her strength is improving.    Diagnostic tests Xray   Patient Stated Goals To return to work pain free    Currently in Pain? Yes   Pain Score 2    Pain Location --  R shoulder        TherEx   Prone rows 2lbs 3x10   Prone shoulder extensions 3lbs 3x10   Seated ER with yellow t-band 3x 10 at no elevation,  Seated thoracic extensions with 5 second holds 2x 10 repetitions   Wall push up a slight incline 2x10  Pt requires min verbal and tactile cues for proper exercise performance   Ionto: Skin prep with ETOH  swab. ionto 12hr  patch applied to      R shoulder   Supraspinatus insertion           With 4mg /ml Dexamethasone. Pt instructed on wear time, potential skin irritations and precautions                                   PT Long Term Goals - 05/16/15 0921    PT LONG TERM GOAL #1   Title pt will reduce work dash disability score to <25% for improved work function    Baseline 44% disability 05/16/15   Time 4   Period Weeks   Status New   PT LONG TERM GOAL #2   Title pt will be able to lift 5lb item overhead x 5 with pain less than 3/10   Time 4   Period Weeks   Status New   PT LONG TERM GOAL #3   Title pt will demonstrate painfree full ROM of the R shoulder    Time 4   Period Weeks   Status New   PT LONG TERM GOAL #4   Title pt will be able to sleep on the R shoulder without being awakened by  pain at night    Time 4   Period Weeks   Status New               Plan - 05/29/15 [id number]    Clinical Impression Statement pt reports muscle fatigue and a little soreness following strengthening activities. pt needs min cues for proper exercise mechanics. soreness mediated with ice massage. began ionto treatment today to reduce inflammation.    Pt will benefit from skilled therapeutic intervention in order to improve on the following deficits Decreased strength;Improper body mechanics;Pain;Impaired UE functional use;Hypomobility;Impaired flexibility;Decreased range of motion   Rehab Potential Good   Clinical Impairments Affecting Rehab Potential hx of R shoulder injury   PT Frequency 2x / week   PT Duration 4 weeks   PT Treatment/Interventions ADLs/Self Care Home Management;Aquatic Therapy;Electrical Stimulation;Iontophoresis 4mg /ml Dexamethasone;Moist Heat;Ultrasound;Patient/family education;Neuromuscular re-education;Therapeutic exercise;Therapeutic activities;Passive range of motion;Dry needling   PT Next Visit Plan Progress ther-ex with weights as appropriate.  Increase ROM with activities, possibly PNF rhythmic stabilization.    Consulted and Agree with Plan of Care Patient        Problem List Patient Active Problem List   Diagnosis Date Noted  . Vitamin B12 deficiency anemia 12/01/2014  . Blood glucose elevated 12/01/2014  . Avitaminosis D 12/01/2014  . GU infection, trichomonal 12/01/2014  . Irregular bleeding 12/01/2014  . Anemia, iron deficiency 12/01/2014  . Essential (primary) hypertension 05/03/2009  . 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 Depression, neurotic 02/03/2009   Gorden Harms. Chelesa Weingartner, PT, DPT [phone]  [place] 05/29/2015, 9:26 AM  [place] MAIN [facility] SERVICES [address] [place], [place], [address] Phone: [phone]   Fax:  [phone]  Name: Anna Bates MRN: [id number] Date of Birth: [birth year]

## 2015-05-30 ENCOUNTER — Ambulatory Visit (INDEPENDENT_AMBULATORY_CARE_PROVIDER_SITE_OTHER): Payer: 59 | Admitting: Physician Assistant

## 2015-05-30 ENCOUNTER — Encounter: Payer: Self-pay | Admitting: Physician Assistant

## 2015-05-30 VITALS — BP 122/78 | HR 84 | Temp 98.7°F | Resp 16 | Wt 238.6 lb

## 2015-05-30 DIAGNOSIS — J40 Bronchitis, not specified as acute or chronic: Secondary | ICD-10-CM | POA: Diagnosis not present

## 2015-05-30 MED ORDER — AZITHROMYCIN 250 MG PO TABS: ORAL_TABLET | ORAL | Status: DC

## 2015-05-30 NOTE — Patient Instructions (Signed)

## 2015-05-30 NOTE — Progress Notes (Signed)
Patient: Anna Bates Female    DOB: 18-Aug-1965   49 y.o.   MRN: [id number] Visit Date: 05/30/2015  Today's Provider: Mar Daring, PA-C   Chief Complaint  Patient presents with  . URI   Subjective:    URI  This is a new problem. The current episode started 1 to 4 weeks ago. The problem has been gradually worsening. There has been no fever. Associated symptoms include congestion, coughing, rhinorrhea, sneezing and wheezing. Pertinent negatives include no abdominal pain, chest pain, headaches, nausea, plugged ear sensation, sinus pain, sore throat or vomiting. She has tried decongestant for the symptoms. The treatment provided no relief.       No Known Allergies Previous Medications   PREDNISONE (STERAPRED UNI-PAK 21 TAB) 10 MG (21) TBPK TABLET    Take 6 tablets on day 1 Take 5 tablets on day 2 Take 4 tablets on day 3 Take 3 tablets on day 4 Take 2 tablets on day 5 Take 1 tablet on day 6   TRAMADOL (ULTRAM) 50 MG TABLET    Take 1 tablet (50 mg total) by mouth every 6 (six) hours as needed.    Review of Systems  Constitutional: Negative.   HENT: Positive for congestion, rhinorrhea and sneezing. Negative for postnasal drip, sinus pressure, sore throat, tinnitus, trouble swallowing and voice change.   Respiratory: Positive for cough and wheezing. Negative for chest tightness and shortness of breath.   Cardiovascular: Negative for chest pain.  Gastrointestinal: Negative for nausea, vomiting and abdominal pain.  Neurological: Negative for headaches.  All other systems reviewed and are negative.   Social History  Substance Use Topics  . Smoking status: Current Every Day Smoker -- 0.50 packs/day for 30 years  . Smokeless tobacco: Not on file  . Alcohol Use: No   Objective:   BP 122/78 mmHg  Pulse 84  Temp(Src) 98.7 F (37.1 C) (Oral)  Resp 16  Wt 238 lb 9.6 oz (108.228 kg)  LMP 05/13/2015  Physical Exam  Constitutional: She appears well-developed  and well-nourished. No distress.  HENT:  Head: Normocephalic and atraumatic.  Right Ear: Hearing, tympanic membrane, external ear and ear canal normal.  Left Ear: Hearing, tympanic membrane, external ear and ear canal normal.  Nose: Mucosal edema and rhinorrhea present. Right sinus exhibits no maxillary sinus tenderness and no frontal sinus tenderness. Left sinus exhibits no maxillary sinus tenderness and no frontal sinus tenderness.  Mouth/Throat: Uvula is midline, oropharynx is clear and moist and mucous membranes are normal. No oropharyngeal exudate, posterior oropharyngeal edema or posterior oropharyngeal erythema.  Eyes: Conjunctivae are normal. Pupils are equal, round, and reactive to light. Right eye exhibits no discharge. Left eye exhibits no discharge. No scleral icterus.  Neck: Normal range of motion. Neck supple. No tracheal deviation present. No thyromegaly present.  Cardiovascular: Normal rate, regular rhythm and normal heart sounds.  Exam reveals no gallop and no friction rub.   No murmur heard. Pulmonary/Chest: Effort normal. No stridor. No respiratory distress. She has no wheezes. She has rhonchi (throughout). She has no rales.  Lymphadenopathy:    She has no cervical adenopathy.  Skin: Skin is warm and dry. She is not diaphoretic.  Vitals reviewed.       Assessment & Plan:     1. Bronchitis Worsening. She does have expiratory rhonchi heard throughout all lung fields. She has been dealing with symptoms for 10 days now and states the wheezing started a couple of days  ago which is why she came to the doctor's office. I will prescribe a Z-Pak as below and I also gave her a Breo sample inhaler today in the office as well. She states that she had similar symptoms in October of last year and was given the breo inhaler then as well and it worked very well clearing her symptoms. I also advised her to continue to take Mucinex DM or Delsym cough syrup for cough and congestion. She states  the cough is not terrible at this time and has been controlled by over-the-counter cough suppressants. She is to call the office if symptoms fail to improve or worsen. - azithromycin (ZITHROMAX) 250 MG tablet; Take 2 tablets PO on day one, and one tablet PO daily thereafter until completed.  Dispense: 6 tablet; Refill: 0 - Fluticasone Furoate-Vilanterol (BREO ELLIPTA) 200-25 MCG/INH AEPB; Inhale 1 puff into the lungs daily.  Dispense: 1 each; Refill: 0       Mar Daring, PA-C  [facility]

## 2015-05-31 ENCOUNTER — Ambulatory Visit: Payer: PRIVATE HEALTH INSURANCE

## 2015-05-31 DIAGNOSIS — M25511 Pain in right shoulder: Secondary | ICD-10-CM | POA: Diagnosis not present

## 2015-05-31 MED ORDER — FLUTICASONE FUROATE-VILANTEROL 200-25 MCG/INH IN AEPB: 1.0000 | INHALATION_SPRAY | Freq: Every day | RESPIRATORY_TRACT | Status: DC

## 2015-05-31 NOTE — Therapy (Signed)
[place] MAIN [facility] SERVICES [address] [place], [place], [address] Phone: [phone]   Fax:  [phone]  Physical Therapy Treatment Physical Therapy Progress Note 05/16/15 to 05/31/15  Patient Details  Name: Anna Bates MRN: [id number] Date of Birth: 1966/04/26 Referring Provider: dr. Onnie Bates  Encounter Date: 05/31/2015      PT End of Session - 05/31/15 1341    Visit Number 6   Number of Visits 9   Date for PT Re-Evaluation 06/13/15   PT Start Time 7893   PT Stop Time 1345   PT Time Calculation (min) 40 min   Activity Tolerance Patient tolerated treatment well  Stopped session as patient began to have "throbbing" sensation in R shoulder   Behavior During Therapy [facility] for tasks assessed/performed      History reviewed. No pertinent past medical history.  Past Surgical History  Procedure Laterality Date  . Shoulder surgery Right 05/09/2009  . Breast surgery  2002    Breast reduction -both  . Cholecystectomy  1991  . Cesarean section  1990  . Hernia repair  1980    There were no vitals filed for this visit.  Visit Diagnosis:  Pain in joint of right shoulder      Subjective Assessment - 05/31/15 1339    Subjective pt reports her shoulder feels pretty good when she doesnt have to use it specifically at work or at PT. she reports everything feels fine below shoulder level, but as soon as she has to lift her arm over 90deg she begins having pain dramatically.    Diagnostic tests Xray   Patient Stated Goals To return to work pain free    Currently in Pain? Yes   Pain Score 3    Pain Location --  R shoulder pain        TherEx  Prone rows 2lbs 3x10  Prone shoulder extensions 3lbs 3x10    Prone shoulder horiz abduction 2x10 Prone shoulder elevation with slight abduction 2x10 Supine serratus punch 2x10 0lbs D 2 shoulder flexion in supine 0lbs 2x10 Cues for scapular retraction required  Pt requires min  verbal and tactile cues for proper exercise performance  Ionto: Skin prep with ETOH swab. ionto 12hr patch applied to R shoulder Supraspinatus insertion With 58m/ml Dexamethasone. Pt instructed on wear time, potential skin irritations and precautions                                  PT Long Term Goals - 05/31/15 1347    PT LONG TERM GOAL #1   Title pt will reduce work dash disability score to <25% for improved work function    Baseline 44% disability 05/16/15   Time 4   Period Weeks   Status On-going   PT LONG TERM GOAL #2   Title pt will be able to lift 5lb item overhead x 5 with pain less than 3/10   Time 4   Period Weeks   Status On-going   PT LONG TERM GOAL #3   Title pt will demonstrate painfree full ROM of the R shoulder    Time 4   Period Weeks   Status Partially Met   PT LONG TERM GOAL #4   Title pt will be able to sleep on the R shoulder without being awakened by pain at night    Time 4   Period Weeks   Status On-going  Plan - 05/31/15 1344    Clinical Impression Statement pt has made progress towards goals regarding her strength and does have full shoulder ROM although painful above 90 deg. pt strength is improving, however her shoulder is still quite easily irritated when lifting above 90 deg even in gravity minimized positioning with pretty minimal activity which is consitant with inflammatory pattern. pt is still struggling to correct her posture still needing cues. pts pain is still local to the sub acromial space, although she does have tenderness to some bellies of the RTC muscles. pt has been seen in PT for 3 weeks, and would benefit from conintued skilled servies to continue to improve strength to hopefully reduce pain, correcting shoulder mechanics including anterior humeral translation.    Pt will benefit from skilled therapeutic intervention in order to improve on the following deficits Decreased  strength;Improper body mechanics;Pain;Impaired UE functional use;Hypomobility;Impaired flexibility;Decreased range of motion   Rehab Potential Good   Clinical Impairments Affecting Rehab Potential hx of R shoulder injury   PT Frequency 2x / week   PT Duration 4 weeks   PT Treatment/Interventions ADLs/Self Care Home Management;Aquatic Therapy;Electrical Stimulation;Iontophoresis 46m/ml Dexamethasone;Moist Heat;Ultrasound;Patient/family education;Neuromuscular re-education;Therapeutic exercise;Therapeutic activities;Passive range of motion;Dry needling   PT Next Visit Plan Progress ther-ex with weights as appropriate. Increase ROM with activities, possibly PNF rhythmic stabilization.    Consulted and Agree with Plan of Care Patient        Problem List Patient Active Problem List   Diagnosis Date Noted  . Vitamin B12 deficiency anemia 12/01/2014  . Blood glucose elevated 12/01/2014  . Avitaminosis D 12/01/2014  . GU infection, trichomonal 12/01/2014  . Irregular bleeding 12/01/2014  . Anemia, iron deficiency 12/01/2014  . Essential (primary) hypertension 05/03/2009  . Essential hemorrhagic thrombocythemia ([place] 03/03/2009  . Depression, neurotic 02/03/2009  Anna Bates, PT, DPT #[phone]  Anna Bates 05/31/2015, 1:50 PM  CRogersMAIN [facility]SERV[facility] [address] [place] [address]Phone: [phone]  Fax:  [phone] Name: Anna PILSONMRN: [id number] of Birth: 202/21/1967

## 2015-06-07 ENCOUNTER — Ambulatory Visit: Payer: PRIVATE HEALTH INSURANCE

## 2015-06-07 DIAGNOSIS — M25511 Pain in right shoulder: Secondary | ICD-10-CM | POA: Diagnosis not present

## 2015-06-07 NOTE — Therapy (Signed)
[place] MAIN [facility] SERVICES [address] [place], [place], [address] Phone: [phone]   Fax:  [phone]  Physical Therapy Treatment  Patient Details  Name: Anna Bates MRN: [id number] Date of Birth: [birth year] Referring Provider: dr. Onnie Graham  Encounter Date: 06/07/2015      PT End of Session - 06/07/15 0856    Visit Number 7   Number of Visits 17   Date for PT Re-Evaluation 07/05/15   PT Start Time 0807   PT Stop Time 0855   PT Time Calculation (min) 48 min   Activity Tolerance Patient tolerated treatment well  Stopped session as patient began to have "throbbing" sensation in R shoulder   Behavior During Therapy [facility] for tasks assessed/performed      History reviewed. No pertinent past medical history.  Past Surgical History  Procedure Laterality Date  . Shoulder surgery Right 05/09/2009  . Breast surgery  2002    Breast reduction -both  . Cholecystectomy  1991  . Cesarean section  1990  . Hernia repair  1980    There were no vitals filed for this visit.  Visit Diagnosis:  Pain in joint of right shoulder - Plan: PT plan of care cert/re-cert      Subjective Assessment - 06/07/15 0854    Subjective pt reports she feels her shoulder is getting stronger, but every time she uses it it hurts. pt had follow up with MD. He would like her to continue PT for another 4 weeks before investigating more interventions for her shoulder pain.    Diagnostic tests Xray   Patient Stated Goals To return to work pain free    Currently in Pain? Yes   Pain Score 3    Pain Location --  R shoulder tip of acromion       Therex: Cable column Rows 12.5lbs 3x10- cues for scapular retraction and to minimize humeral fwd translation Cable column chest press 7lbs R, 12lbs L 2x10 Prone over T ball "T's, W's (0,2lbs) 2x10 each Tball push up on wall 2x10 Tball full plank on wall 2x1 min D1shoulder extension red band 2x10 needing mod cues  for proper mechanics Pt requires min -mod verbal and tactile cues for proper exercise performance   Ionto: Skin prep with ETOH swab. ionto patch applied to         R subacromial space           With 82m/ml Dexamethasone. Pt instructed on wear time, potential skin irritations and precautions   Ice to R shoulder x 6 min no charge following therex                             PT Education - 06/07/15 0855    Education provided Yes   Education Details importance of further shoulder strengthening to maximize theraputic benefit based on current weakness   Person(s) Educated Patient   Methods Explanation   Comprehension Verbalized understanding             PT Long Term Goals - 05/31/15 1347    PT LONG TERM GOAL #1   Title pt will reduce work dash disability score to <25% for improved work function    Baseline 44% disability 05/16/15   Time 4   Period Weeks   Status On-going   PT LONG TERM GOAL #2   Title pt will be able to lift 5lb item overhead x 5 with  pain less than 3/10   Time 4   Period Weeks   Status On-going   PT LONG TERM GOAL #3   Title pt will demonstrate painfree full ROM of the R shoulder    Time 4   Period Weeks   Status Partially Met   PT LONG TERM GOAL #4   Title pt will be able to sleep on the R shoulder without being awakened by pain at night    Time 4   Period Weeks   Status On-going               Plan - 06/07/15 0857    Clinical Impression Statement As stated in previous progress note pt has made progress regarding strength, but is still having pain. Per MD and PT recommendation pt would likely benefit from continued skilled PT services to further improve strength, shoulder mechanics and reduce anterior humeral tranlsation thus reducing pain. 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 PT did progress  strengthening and shoulder stabilization exercises today with minimal increase in pain.    Pt will benefit from skilled therapeutic intervention in order to improve on the following deficits Decreased strength;Improper body mechanics;Pain;Impaired UE functional use;Hypomobility;Impaired flexibility;Decreased range of motion   Rehab Potential Good   Clinical Impairments Affecting Rehab Potential hx of R shoulder injury   PT Frequency 2x / week   PT Duration 4 weeks   PT Treatment/Interventions ADLs/Self Care Home Management;Aquatic Therapy;Electrical Stimulation;Iontophoresis 25m/ml Dexamethasone;Moist Heat;Ultrasound;Patient/family education;Neuromuscular re-education;Therapeutic exercise;Therapeutic activities;Passive range of motion;Dry needling   PT Next Visit Plan Progress ther-ex with weights as appropriate. Increase ROM with activities, possibly PNF rhythmic stabilization.    Consulted and Agree with Plan of Care Patient        Problem List Patient Active Problem List   Diagnosis Date Noted  . Vitamin B12 deficiency anemia 12/01/2014  . Blood glucose elevated 12/01/2014  . Avitaminosis D 12/01/2014  . GU infection, trichomonal 12/01/2014  . Irregular bleeding 12/01/2014  . Anemia, iron deficiency 12/01/2014  . Essential (primary) hypertension 05/03/2009  . Essential hemorrhagic thrombocythemia ([place] 03/03/2009  . Depression, neurotic 02/03/2009   AGorden Harms Dionna Wiedemann, PT, DPT #[phone]  Rayden Dock 06/07/2015, 9:01 AM  CAlcoluMAIN RKishwaukee [facility] HospitalSERVICES [address] [address]Phone: [phone]  Fax:  [phone] Name: KBELINA MANDILEMRN: [id number] of Birth: 210-Jun-1967

## 2015-06-14 ENCOUNTER — Ambulatory Visit: Payer: PRIVATE HEALTH INSURANCE | Attending: Orthopedic Surgery

## 2015-06-14 DIAGNOSIS — M25511 Pain in right shoulder: Secondary | ICD-10-CM | POA: Diagnosis not present

## 2015-06-15 NOTE — Patient Instructions (Signed)
[email or web address] Supine serratus punch 0lbs 3x10 sidelying shoulder flexion with scapular retraction 3x10 to 90 deg Wall slide into V (no lift off) 3x5. Cues for lower trap activation and reduced upper trap activation

## 2015-06-15 NOTE — Therapy (Signed)
[place] MAIN [facility] SERVICES [address] [place], [place], [address] Phone: [phone]   Fax:  [phone]  Physical Therapy Treatment  Patient Details  Name: Anna Bates MRN: [id number] Date of Birth: 10-19-1965 Referring Provider: dr. Onnie Graham  Encounter Date: 06/14/2015      PT End of Session - 06/15/15 0749    Visit Number 8   Number of Visits 17   Date for PT Re-Evaluation 07/05/15   PT Start Time 1600   PT Stop Time 1640   PT Time Calculation (min) 40 min   Activity Tolerance Patient tolerated treatment well  Stopped session as patient began to have "throbbing" sensation in R shoulder   Behavior During Therapy [facility] for tasks assessed/performed      History reviewed. No pertinent past medical history.  Past Surgical History  Procedure Laterality Date  . Shoulder surgery Right 05/09/2009  . Breast surgery  2002    Breast reduction -both  . Cholecystectomy  1991  . Cesarean section  1990  . Hernia repair  1980    There were no vitals filed for this visit.  Visit Diagnosis:  Pain in joint of right shoulder      Subjective Assessment - 06/15/15 0748    Subjective pt reports her shoulder only hurts when she works. she reports she does not use her arm any other time because she knows it will hurt.    Diagnostic tests Xray   Patient Stated Goals To return to work pain free    Currently in Pain? Yes   Pain Score 4    Pain Location --  R shoulder tip       Therex: UBE retro x 2 min L1 no charge Supine serratus punch 0lbs 3x10 sidelying shoulder flexion with scapular retraction 3x10 to 90 deg Prone shoulder extension 2lbs 3x10 Prone shoulder horiz abduction 0lbs 3x10 Wall slide into V (no lift off) 3x5. Cues for lower trap activation and reduced upper trap activation Pt requires min verbal and tactile cues for proper exercise performance    ionto.  Skin prep with ETOH swab. ionto STAT patch applied to         R shoulder subacromial space/supraspinatus insertion            With 3m/ml Dexamethasone. Pt instructed on wear time, potential skin irritations and precautions                            PT Education - 06/15/15 0748    Education provided Yes   Education Details trying to encorportate more RUE use at home vs. fear avoidance. new HEP   Person(s) Educated Patient   Methods Explanation   Comprehension Verbalized understanding             PT Long Term Goals - 05/31/15 1347    PT LONG TERM GOAL #1   Title pt will reduce work dash disability score to <25% for improved work function    Baseline 44% disability 05/16/15   Time 4   Period Weeks   Status On-going   PT LONG TERM GOAL #2   Title pt will be able to lift 5lb item overhead x 5 with pain less than 3/10   Time 4   Period Weeks   Status On-going   PT LONG TERM GOAL #3   Title pt will demonstrate painfree full ROM of the R shoulder    Time  4   Period Weeks   Status Partially Met   PT LONG TERM GOAL #4   Title pt will be able to sleep on the R shoulder without being awakened by pain at night    Time 4   Period Weeks   Status On-going               Plan - 06/15/15 0750    Clinical Impression Statement progressed strengthening and HEP today with encouragement to begin modified above shoulder level movements with the RUE. PT continuing to focus on progression of periscapular strengthening and RTC strengthening to improve shoulder mechanics and reduce process of impingement. pt was able to tolerate AAROM with wall slide to nearly full shoulder flexion with cues for lower trap activation.    Pt will benefit from skilled therapeutic intervention in order to improve on the following deficits Decreased strength;Improper body mechanics;Pain;Impaired UE functional use;Hypomobility;Impaired flexibility;Decreased range of motion   Rehab Potential Good   Clinical Impairments Affecting Rehab Potential hx of R  shoulder injury   PT Frequency 2x / week   PT Duration 4 weeks   PT Treatment/Interventions ADLs/Self Care Home Management;Aquatic Therapy;Electrical Stimulation;Iontophoresis 33m/ml Dexamethasone;Moist Heat;Ultrasound;Patient/family education;Neuromuscular re-education;Therapeutic exercise;Therapeutic activities;Passive range of motion;Dry needling   PT Next Visit Plan Progress ther-ex with weights as appropriate. Increase ROM with activities, possibly PNF rhythmic stabilization.    Consulted and Agree with Plan of Care Patient        Problem List Patient Active Problem List   Diagnosis Date Noted  . Vitamin B12 deficiency anemia 12/01/2014  . Blood glucose elevated 12/01/2014  . Avitaminosis D 12/01/2014  . GU infection, trichomonal 12/01/2014  . Irregular bleeding 12/01/2014  . Anemia, iron deficiency 12/01/2014  . Essential (primary) hypertension 05/03/2009  . Essential hemorrhagic thrombocythemia ([place] 03/03/2009  . Depression, neurotic 02/03/2009   AGorden Harms Zorion Nims, PT, DPT #[id number] Jehad Bisono 06/15/2015, 7:52 AM  CHublersburgMAIN RMs [facility]SERV[facility] [address] [address]Phone: [phone]  Fax:  [phone] Name: Anna BLAKENEYMRN: [id number] of Birth: [birth year]

## 2015-06-16 ENCOUNTER — Ambulatory Visit: Payer: PRIVATE HEALTH INSURANCE | Admitting: Physical Therapy

## 2015-06-16 ENCOUNTER — Encounter: Payer: Self-pay | Admitting: Physical Therapy

## 2015-06-16 DIAGNOSIS — M25511 Pain in right shoulder: Secondary | ICD-10-CM | POA: Diagnosis not present

## 2015-06-16 NOTE — Therapy (Signed)
[place] MAIN [facility] SERVICES [address] [place], [place], [address] Phone: [phone]   Fax:  [phone]  Physical Therapy Treatment  Patient Details  Name: Anna Bates MRN: [id number] Date of Birth: [birth year] Referring Provider: dr. Onnie Graham  Encounter Date: 06/16/2015      PT End of Session - 06/16/15 0914    Visit Number 9   Number of Visits 17   Date for PT Re-Evaluation 07/05/15   PT Start Time 0815   PT Stop Time 0900   PT Time Calculation (min) 45 min   Activity Tolerance Patient tolerated treatment well;Patient limited by pain   Behavior During Therapy [facility] for tasks assessed/performed      History reviewed. No pertinent past medical history.  Past Surgical History  Procedure Laterality Date  . Shoulder surgery Right 05/09/2009  . Breast surgery  2002    Breast reduction -both  . Cholecystectomy  1991  . Cesarean section  1990  . Hernia repair  1980    There were no vitals filed for this visit.  Visit Diagnosis:  Pain in joint of right shoulder      Subjective Assessment - 06/16/15 0815    Subjective Patient reports that she doesn't have any pain when she is at home resting; She does report increased pain with working. She reports wearing patch after last visit and reports that her pain didn't diminish after last treatment session;    Diagnostic tests Xray   Patient Stated Goals To return to work pain free    Currently in Pain? Yes   Pain Score 4    Pain Location Shoulder   Pain Orientation Right   Pain Descriptors / Indicators Throbbing   Pain Type Chronic pain         Therex: UBE retro x3 min L2 (unbilled);  Pball on wall: BUE flexion/extension x10 with min VCs to avoid RUE shoulder elevation; BUE circles (clockwise/counterclockwise) x5 each; BUE wall push ups x10;  Supine: Yellow tband RUE shoulder PNF D1/D2 flexion/extension x10 each;  sidelying RUE shoulder ER 1# 2x10;  Prone RUE  shoulder extension 2lbs 2x12 Prone RUE shoulder rows (low row) 2# 2x10; Prone RUE shoulder horiz abduction 1# 2x10  Wall slide into V (wide V) 2x5. Cues for lower trap activation and reduced upper trap activation Wall slides into wide V (partial) with lift off 2x5 with min VCs to avoid shoulder elevation and to increase scapular retraction lower trap activation for increased strengthening;  Patient required min-moderate verbal/tactile cues for correct exercise technique. She required cues to reduce shoulder elevation and improve scapular retraction;        PT finished session with [place], continuous, 1MHz, 1.8 watts per centimeter squared x8 min to right shoulder with biofreeze/US gel. Patient reports no change in symptoms following [place]. She was able to tolerate the pressure of [place] with less tenderness but reports return of pain when trying to get dressed.                     PT Education - 06/16/15 [phone]    Education provided Yes   Education Details HEP reinforced; new exercise and posture/positioning   Person(s) Educated Patient   Methods Explanation;Verbal cues   Comprehension Verbalized understanding;Returned demonstration;Verbal cues required             PT Long Term Goals - 05/31/15 1347    PT LONG TERM GOAL #1   Title pt will reduce work  dash disability score to <25% for improved work function    Baseline 44% disability 05/16/15   Time 4   Period Weeks   Status On-going   PT LONG TERM GOAL #2   Title pt will be able to lift 5lb item overhead x 5 with pain less than 3/10   Time 4   Period Weeks   Status On-going   PT LONG TERM GOAL #3   Title pt will demonstrate painfree full ROM of the R shoulder    Time 4   Period Weeks   Status Partially Met   PT LONG TERM GOAL #4   Title pt will be able to sleep on the R shoulder without being awakened by pain at night    Time 4   Period Weeks   Status On-going               Plan - 06/16/15 0915     Clinical Impression Statement Patient continues to be pain focused. She required increased cues for correct exercise technique to reduce strain on shoulder and increase middle/lower trap activation for better postural control. Patient was able to tolerate wall slides better with wider "V" position for less discomfort. PT applied [place] to right shoulder with biofreeze/gel combo. She was able to tolerate light pressure but reports no change in pain following treatment with increased pain trying to get shirt back on. She would benefit from additional skilled PT intervention to improve RUE shoulder ROM/strength;    Pt will benefit from skilled therapeutic intervention in order to improve on the following deficits Decreased strength;Improper body mechanics;Pain;Impaired UE functional use;Hypomobility;Impaired flexibility;Decreased range of motion   Rehab Potential Good   Clinical Impairments Affecting Rehab Potential hx of R shoulder injury   PT Frequency 2x / week   PT Duration 4 weeks   PT Treatment/Interventions ADLs/Self Care Home Management;Aquatic Therapy;Electrical Stimulation;Iontophoresis 4m/ml Dexamethasone;Moist Heat;Ultrasound;Patient/family education;Neuromuscular re-education;Therapeutic exercise;Therapeutic activities;Passive range of motion;Dry needling   PT Next Visit Plan Progress ther-ex with weights as appropriate. Increase ROM with activities, possibly PNF rhythmic stabilization.    Consulted and Agree with Plan of Care Patient        Problem List Patient Active Problem List   Diagnosis Date Noted  . Vitamin B12 deficiency anemia 12/01/2014  . Blood glucose elevated 12/01/2014  . Avitaminosis D 12/01/2014  . GU infection, trichomonal 12/01/2014  . Irregular bleeding 12/01/2014  . Anemia, iron deficiency 12/01/2014  . Essential (primary) hypertension 05/03/2009  . Essential hemorrhagic thrombocythemia ([place] 03/03/2009  . Depression, neurotic 02/03/2009    Shellie Goettl PT,  DPT 06/16/2015, 9:17 AM  CLake PocotopaugMAIN RPrevost Memorial HospitalSERVICES [address]RTal[place] [address]Phone: [phone]  Fax:  [phone] Name: Anna EBLEMRN: [id number] of Birth: [birth year]

## 2015-06-21 ENCOUNTER — Ambulatory Visit: Payer: PRIVATE HEALTH INSURANCE

## 2015-06-22 ENCOUNTER — Ambulatory Visit: Payer: PRIVATE HEALTH INSURANCE

## 2015-06-22 DIAGNOSIS — M25511 Pain in right shoulder: Secondary | ICD-10-CM | POA: Diagnosis not present

## 2015-06-22 NOTE — Therapy (Signed)
[place] MAIN [facility] SERVICES [address] [place], [place], [address] Phone: [phone]   Fax:  [phone]  Physical Therapy Treatment  Patient Details  Name: Anna Bates MRN: [id number] Date of Birth: 04/18/1966 Referring Provider: dr. Onnie Graham  Encounter Date: 06/22/2015      PT End of Session - 06/22/15 1737    Visit Number 10   Number of Visits 17   Date for PT Re-Evaluation 07/05/15   PT Start Time 1645   PT Stop Time 1725   PT Time Calculation (min) 40 min   Activity Tolerance Patient tolerated treatment well;Patient limited by pain   Behavior During Therapy [facility] for tasks assessed/performed      No past medical history on file.  Past Surgical History  Procedure Laterality Date  . Shoulder surgery Right 05/09/2009  . Breast surgery  2002    Breast reduction -both  . Cholecystectomy  1991  . Cesarean section  1990  . Hernia repair  1980    There were no vitals filed for this visit.  Visit Diagnosis:  Pain in joint of right shoulder      Subjective Assessment - 06/22/15 1736    Subjective pt reports she felt like the [place] helped her for a few hours after last session. she reports otherwise her pain is the same. She still reports she doesnt use it at home.    Diagnostic tests Xray   Patient Stated Goals To return to work pain free    Currently in Pain? Yes   Pain Score 4    Pain Location --  shoulder        therex:    sidelying RUE shoulder flexion 1# 3x10;  Prone RUE shoulder extension 2lbs 2x12 Prone RUE shoulder rows (low row) 2# 2x10; Prone RUE shoulder horiz abduction 1# 2x10  Wall slide into V (wide V) 2x5. Cues for lower trap activation and reduced upper trap activation Wall slides into wide V (partial) with lift off 2x5 with min VCs to avoid shoulder elevation and to increase scapular retraction lower trap activation for increased strengthening;  Patient required min-moderate verbal/tactile  cues for correct exercise technique. She required cues to reduce shoulder elevation and improve scapular retraction;        PT finished session with [place], Pulsed at 20%, 1MHz, 1.8 watts per centimeter squared x8 min to right shoulder with biofreeze/US gel. Patient reports no change in symptoms following [place]. Pt was reflecting pain by abruptly moving her arm when [place] head went over the sore spot in her shoulder                               PT Long Term Goals - 05/31/15 1347    PT LONG TERM GOAL #1   Title pt will reduce work dash disability score to <25% for improved work function    Baseline 44% disability 05/16/15   Time 4   Period Weeks   Status On-going   PT LONG TERM GOAL #2   Title pt will be able to lift 5lb item overhead x 5 with pain less than 3/10   Time 4   Period Weeks   Status On-going   PT LONG TERM GOAL #3   Title pt will demonstrate painfree full ROM of the R shoulder    Time 4   Period Weeks   Status Partially Met   PT LONG TERM  GOAL #4   Title pt will be able to sleep on the R shoulder without being awakened by pain at night    Time 4   Period Weeks   Status On-going               Plan - 06/22/15 1737    Clinical Impression Statement pt still is hypersensitive over the tip of her shoulder. she expresses severe pain with only very light touch in this area which is atypical. she continues to need cues to minimize shoulder shrug and work on reducing upper trap dominance with [place] movement.    Pt will benefit from skilled therapeutic intervention in order to improve on the following deficits Decreased strength;Improper body mechanics;Pain;Impaired UE functional use;Hypomobility;Impaired flexibility;Decreased range of motion   Rehab Potential Good   Clinical Impairments Affecting Rehab Potential hx of R shoulder injury   PT Frequency 2x / week   PT Duration 4 weeks   PT Treatment/Interventions ADLs/Self Care Home Management;Aquatic  Therapy;Electrical Stimulation;Iontophoresis 53m/ml Dexamethasone;Moist Heat;Ultrasound;Patient/family education;Neuromuscular re-education;Therapeutic exercise;Therapeutic activities;Passive range of motion;Dry needling   PT Next Visit Plan Progress ther-ex with weights as appropriate. Increase ROM with activities, possibly PNF rhythmic stabilization.    Consulted and Agree with Plan of Care Patient        Problem List Patient Active Problem List   Diagnosis Date Noted  . Vitamin B12 deficiency anemia 12/01/2014  . Blood glucose elevated 12/01/2014  . Avitaminosis D 12/01/2014  . GU infection, trichomonal 12/01/2014  . Irregular bleeding 12/01/2014  . Anemia, iron deficiency 12/01/2014  . Essential (primary) hypertension 05/03/2009  . Essential hemorrhagic thrombocythemia ([place] 03/03/2009  . Depression, neurotic 02/03/2009   AGorden Harms Harlee Eckroth, PT, DPT #[phone] Mozella Rexrode 06/22/2015, 5:39 PM  [place] [facility] [address] [address]Phone: [phone]  Fax:  [phone] Name: KKINA SHIFFMANMRN: [id number] of Birth: 204/18/1967

## 2015-06-26 ENCOUNTER — Ambulatory Visit: Payer: PRIVATE HEALTH INSURANCE

## 2015-06-26 DIAGNOSIS — M25511 Pain in right shoulder: Secondary | ICD-10-CM

## 2015-06-26 NOTE — Therapy (Signed)
[place] MAIN [facility] SERVICES [address] [place], [place], [address] Phone: [phone]   Fax:  [phone]  Physical Therapy Treatment  Patient Details  Name: Anna Bates MRN: [id number] Date of Birth: Apr 24, 1966 Referring Provider: dr. Onnie Graham  Encounter Date: 06/26/2015      PT End of Session - 06/26/15 0852    Visit Number 11   Number of Visits 17   Date for PT Re-Evaluation 07/05/15   PT Start Time 0808   PT Stop Time 0844   PT Time Calculation (min) 36 min   Activity Tolerance Patient tolerated treatment well;Patient limited by pain   Behavior During Therapy [facility] for tasks assessed/performed      No past medical history on file.  Past Surgical History  Procedure Laterality Date  . Shoulder surgery Right 05/09/2009  . Breast surgery  2002    Breast reduction -both  . Cholecystectomy  1991  . Cesarean section  1990  . Hernia repair  1980    There were no vitals filed for this visit.  Visit Diagnosis:  Pain in joint of right shoulder      Subjective Assessment - 06/26/15 0812    Subjective Patient reports she has no pain currently, she reports she has developed strength in her shoulder but that the pain has not changed. She reports a good weekend as she didn't have to work as much as usual.    Diagnostic tests Xray   Patient Stated Goals To return to work pain free    Currently in Pain? No/denies       TherEx  Seated shoulder flexion to assess motion (pain seems to be worst with eccentric control. Pain right around supraspinatus tendon and bursa)  Soft tissue mobilization with minimal pressure applied to reduce local irritable symptoms around superior margins of GH joint.   Sidelying shoulder ER BW x 10, 1# x 10, 2# x 10 (mild increase in pain, though she was able to tolerate)  Standing rows on cable column with 12.5# x 10 repetitions (medium) for 3 sets (had tried red, green bands and 7.5# all easy)    Cleared C-spine with overpressure in flexion, extension, lateral rotation bilaterally.   Prone horizontal abductions with 1# DB x 10 repetitions for 2 sets                          PT Education - 06/26/15 0900    Education provided Yes   Education Details Cuing for lower trap activation, scapular retraction techniques.   Person(s) Educated Patient   Methods Demonstration;Explanation   Comprehension Verbalized understanding;Returned demonstration;Verbal cues required             PT Long Term Goals - 05/31/15 1347    PT LONG TERM GOAL #1   Title pt will reduce work dash disability score to <25% for improved work function    Baseline 44% disability 05/16/15   Time 4   Period Weeks   Status On-going   PT LONG TERM GOAL #2   Title pt will be able to lift 5lb item overhead x 5 with pain less than 3/10   Time 4   Period Weeks   Status On-going   PT LONG TERM GOAL #3   Title pt will demonstrate painfree full ROM of the R shoulder    Time 4   Period Weeks   Status Partially Met   PT LONG TERM GOAL #4  Title pt will be able to sleep on the R shoulder without being awakened by pain at night    Time 4   Period Weeks   Status On-going               Plan - 06/26/15 0981    Clinical Impression Statement Patient continues to present with a central sensitization pattern, allodynia noted over the margins of the [place]. She reports feeling stronger in general and is able to tolerate progression of weights within this session. Patient may benefit from extension based work at [facility] joint as well as aquatic therapy to provide graded exposure to movement given her continued high pain levels reported. Patient reports she will be seeing her orthopedist on the 25th.     Pt will benefit from skilled therapeutic intervention in order to improve on the following deficits Decreased strength;Improper body mechanics;Pain;Impaired UE functional use;Hypomobility;Impaired  flexibility;Decreased range of motion   Rehab Potential Good   Clinical Impairments Affecting Rehab Potential hx of R shoulder injury   PT Frequency 2x / week   PT Duration 4 weeks   PT Treatment/Interventions ADLs/Self Care Home Management;Aquatic Therapy;Electrical Stimulation;Iontophoresis 42m/ml Dexamethasone;Moist Heat;Ultrasound;Patient/family education;Neuromuscular re-education;Therapeutic exercise;Therapeutic activities;Passive range of motion;Dry needling   PT Next Visit Plan Can try shoulder extension based activities ([place] idea for painful shoulder flexion) progress ther-ex with weights and eccentric control of shoulder flexion/scaption.    Consulted and Agree with Plan of Care Patient        Problem List Patient Active Problem List   Diagnosis Date Noted  . Vitamin B12 deficiency anemia 12/01/2014  . Blood glucose elevated 12/01/2014  . Avitaminosis D 12/01/2014  . GU infection, trichomonal 12/01/2014  . Irregular bleeding 12/01/2014  . Anemia, iron deficiency 12/01/2014  . Essential (primary) hypertension 05/03/2009  . Essential hemorrhagic thrombocythemia ([place] 03/03/2009  . Depression, neurotic 02/03/2009    PKerman Bates PT, DPT    06/26/2015, 6:22 PM  C[place]MAIN [facility] Surgery IncSERVICES [address] [address]Phone: [phone]  Fax:  [phone] Name: Anna DISLAMRN: [id number] of Birth: 2March 06, 1967

## 2015-06-27 ENCOUNTER — Ambulatory Visit: Payer: PRIVATE HEALTH INSURANCE

## 2015-06-27 DIAGNOSIS — M25511 Pain in right shoulder: Secondary | ICD-10-CM | POA: Diagnosis not present

## 2015-06-27 DIAGNOSIS — H52223 Regular astigmatism, bilateral: Secondary | ICD-10-CM | POA: Diagnosis not present

## 2015-06-27 DIAGNOSIS — H5213 Myopia, bilateral: Secondary | ICD-10-CM | POA: Diagnosis not present

## 2015-06-27 NOTE — Therapy (Signed)
[place] MAIN [facility] SERVICES [address] [place], [place], [address] Phone: [phone]   Fax:  [phone]  Physical Therapy Treatment  Patient Details  Name: Anna Bates MRN: [id number] Date of Birth: 07-29-1965 Referring Provider: dr. Onnie Graham  Encounter Date: 06/27/2015      PT End of Session - 06/27/15 1115    Visit Number 12   Number of Visits 17   Date for PT Re-Evaluation 07/05/15   PT Start Time 0810   PT Stop Time 0850   PT Time Calculation (min) 40 min   Equipment Utilized During Treatment Gait belt   Activity Tolerance Patient tolerated treatment well;Patient limited by pain   Behavior During Therapy [facility] for tasks assessed/performed      History reviewed. No pertinent past medical history.  Past Surgical History  Procedure Laterality Date  . Shoulder surgery Right 05/09/2009  . Breast surgery  2002    Breast reduction -both  . Cholecystectomy  1991  . Cesarean section  1990  . Hernia repair  1980    There were no vitals filed for this visit.  Visit Diagnosis:  Pain in joint of right shoulder      Subjective Assessment - 06/27/15 0820    Subjective Pt reports she is doing well on this date. She reports 2/10 R shoulder pain at rest. States that she was not too sore after her last therapy session yesterday. She did report some R sided anterolateral neck spasms last night which she has never experienced before. No specific questions or concerns at this time.    Diagnostic tests Xray   Patient Stated Goals To return to work pain free    Currently in Pain? Yes   Pain Score 2    Pain Location Shoulder   Pain Orientation Right   Pain Type Chronic pain   Aggravating Factors  Overhead activities   Pain Relieving Factors Rest   Multiple Pain Sites No        TREATMENT  Ther-ex IFC applied to R shoulder at 5.8 V (Pt tolerated intensity) for the following activity: Supine canes for flexion x 10; Supine  canes for scaption x 10; Serratus punch with manual resistance 2 x 10; L sidelying R ER with cues for elbow to side 2# 2 x 10; L sidelying AROM scaption to 90 degrees; Prone RUE shoulder extension 2# 2x12 Prone RUE shoulder rows (low row) 2# 2x10; Prone RUE shoulder horiz abduction 2# 2x10; Seated  Wall slide into V (wide V) 2x10. Cues for lower trap activation and reduced upper trap activation Patient required min-moderate verbal/tactile cues for correct exercise technique. CP applied to R shoulder x 6 minutes at end of session (unbilled);                          PT Education - 06/27/15 1115    Education provided Yes   Education Details Low trap activation, scapular retraction, importance of HEP, seated shoulder flexion PROM table slides   Person(s) Educated Patient   Methods Explanation;Demonstration   Comprehension Verbalized understanding             PT Long Term Goals - 05/31/15 1347    PT LONG TERM GOAL #1   Title pt will reduce work dash disability score to <25% for improved work function    Baseline 44% disability 05/16/15   Time 4   Period Weeks   Status On-going   PT  LONG TERM GOAL #2   Title pt will be able to lift 5lb item overhead x 5 with pain less than 3/10   Time 4   Period Weeks   Status On-going   PT LONG TERM GOAL #3   Title pt will demonstrate painfree full ROM of the R shoulder    Time 4   Period Weeks   Status Partially Met   PT LONG TERM GOAL #4   Title pt will be able to sleep on the R shoulder without being awakened by pain at night    Time 4   Period Weeks   Status On-going               Plan - 06/27/15 1119    Clinical Impression Statement Pt tolerates exercises well. IFC utilized during ther-ex to manage pain and it appears to help pt achieve increased range of motion as session progresses. Pt with good technique during exercises. Encouraged pt to continue HEP and follow-up as scheduled    Pt will benefit  from skilled therapeutic intervention in order to improve on the following deficits Decreased strength;Improper body mechanics;Pain;Impaired UE functional use;Hypomobility;Impaired flexibility;Decreased range of motion   Rehab Potential Good   Clinical Impairments Affecting Rehab Potential hx of R shoulder injury   PT Frequency 2x / week   PT Duration 4 weeks   PT Treatment/Interventions ADLs/Self Care Home Management;Aquatic Therapy;Electrical Stimulation;Iontophoresis 6m/ml Dexamethasone;Moist Heat;Ultrasound;Patient/family education;Neuromuscular re-education;Therapeutic exercise;Therapeutic activities;Passive range of motion;Dry needling   PT Next Visit Plan Progress ther-ex with weights and eccentric control of shoulder flexion/scaption.    PT Home Exercise Plan As prescribed   Consulted and Agree with Plan of Care Patient        Problem List Patient Active Problem List   Diagnosis Date Noted  . Vitamin B12 deficiency anemia 12/01/2014  . Blood glucose elevated 12/01/2014  . Avitaminosis D 12/01/2014  . GU infection, trichomonal 12/01/2014  . Irregular bleeding 12/01/2014  . Anemia, iron deficiency 12/01/2014  . Essential (primary) hypertension 05/03/2009  . Essential hemorrhagic thrombocythemia ([place] 03/03/2009  . Depression, neurotic 02/03/2009   JPhillips GroutPT, DPT   Lakiya Cottam 06/27/2015, 11:21 AM  CA[place]MAIN RCleveland [facility] SuitesSERVICES [address] [address]Phone: [phone]  Fax:  [phone] Name: Anna LINDERSMRN: [id number] of Birth: 202/27/1967

## 2015-07-05 ENCOUNTER — Ambulatory Visit: Payer: PRIVATE HEALTH INSURANCE

## 2015-07-06 ENCOUNTER — Ambulatory Visit: Payer: PRIVATE HEALTH INSURANCE

## 2015-07-12 ENCOUNTER — Other Ambulatory Visit: Payer: Self-pay | Admitting: Orthopedic Surgery

## 2015-07-12 DIAGNOSIS — M25511 Pain in right shoulder: Secondary | ICD-10-CM

## 2015-08-02 ENCOUNTER — Ambulatory Visit
Admission: RE | Admit: 2015-08-02 | Discharge: 2015-08-02 | Disposition: A | Discharge status: Home / Self Care | Payer: PRIVATE HEALTH INSURANCE | Source: Ambulatory Visit | Attending: Orthopedic Surgery | Admitting: Orthopedic Surgery

## 2015-08-02 DIAGNOSIS — M25511 Pain in right shoulder: Secondary | ICD-10-CM | POA: Insufficient documentation

## 2015-08-02 DIAGNOSIS — M67813 Other specified disorders of tendon, right shoulder: Secondary | ICD-10-CM | POA: Insufficient documentation

## 2015-08-02 DIAGNOSIS — S4351XA Sprain of right acromioclavicular joint, initial encounter: Secondary | ICD-10-CM | POA: Insufficient documentation

## 2015-08-02 DIAGNOSIS — Z9889 Other specified postprocedural states: Secondary | ICD-10-CM | POA: Insufficient documentation

## 2015-08-02 DIAGNOSIS — M25411 Effusion, right shoulder: Secondary | ICD-10-CM | POA: Diagnosis not present

## 2015-11-07 HISTORY — PX: OTHER SURGICAL HISTORY: SHX169

## 2015-11-14 ENCOUNTER — Ambulatory Visit: Payer: PRIVATE HEALTH INSURANCE | Attending: Orthopedic Surgery | Admitting: Physical Therapy

## 2015-11-14 ENCOUNTER — Encounter: Payer: Self-pay | Admitting: Physical Therapy

## 2015-11-14 DIAGNOSIS — M6281 Muscle weakness (generalized): Secondary | ICD-10-CM | POA: Diagnosis present

## 2015-11-14 DIAGNOSIS — M25611 Stiffness of right shoulder, not elsewhere classified: Secondary | ICD-10-CM | POA: Diagnosis present

## 2015-11-14 DIAGNOSIS — R293 Abnormal posture: Secondary | ICD-10-CM | POA: Diagnosis present

## 2015-11-14 DIAGNOSIS — M25511 Pain in right shoulder: Secondary | ICD-10-CM

## 2015-11-14 NOTE — Patient Instructions (Addendum)
Cane Overhead - Standing   With arms straight, hold cane forward at waist. Raise cane above head. Hold 3___ seconds. Repeat _10__ times. Do _2__ times per day.  Copyright  VHI. All rights reserved.  Cane Exercise: Abduction   Hold cane with right hand over end, palm-up, with other hand palm-down. Move arm out from side and up by pushing with other arm. Hold _3___ seconds. Repeat __10__ times. Do ___2_ sessions per day.  http://gt2.exer.us/82   Copyright  VHI. All rights reserved.  Cane Exercise: Extension   Stand holding cane behind back with both hands palm-up. Slide arm up behind bending elbows,  Hold _3___ seconds. Repeat __10__ times. Do _2___ sessions per day.  http://gt2.exer.us/84   Copyright  VHI. All rights reserved.  SHOULDER: External Rotation - Supine (Cane)   Hold cane with both hands. Rotate arm away from body. Keep elbow on floor and next to body. _10__ reps per set, _2__ sets per day, __5_ days per week Add towel to keep elbow at side.  Copyright  VHI. All rights reserved.  Press-Up With Wand    Sitting or lying down, hold wand at chest level and press arms straight out (chest press) Hold _2___ seconds. Repeat _10___ times. Do __2__ sessions per day.  Copyright  VHI. All rights reserved.

## 2015-11-14 NOTE — Therapy (Signed)
[place] MAIN [facility] SERVICES [place], [place], [address] Phone: [phone]   Fax:  [phone]  Physical Therapy Evaluation  Patient Details  Name: Anna Bates MRN: [id number] Date of Birth: Nov 04, 1965 Referring Provider: Dr. Onnie Graham  Encounter Date: 11/14/2015      PT End of Session - 11/14/15 1143    Visit Number 1   Number of Visits 12   Date for PT Re-Evaluation 01/09/16   Authorization Type worker's comp approved 12 visits   PT Start Time [id number]   PT Stop Time 1130   PT Time Calculation (min) 46 min   Activity Tolerance Patient tolerated treatment well;Patient limited by pain   Behavior During Therapy [facility] for tasks assessed/performed      History reviewed. No pertinent past medical history.  Past Surgical History  Procedure Laterality Date  . Shoulder surgery Right 05/09/2009  . Breast surgery  2002    Breast reduction -both  . Cholecystectomy  1991  . Cesarean section  1990  . Hernia repair  1980  . Right shoulder surgery Right 11/07/15    shoulder scope;     There were no vitals filed for this visit.       Subjective Assessment - 11/14/15 1048    Subjective 50 yo Female s/p right shoulder scope with SAD and DCR on Nov 07, 2015; She presents to therapy with sling; She reports wearing the sling when driving or out in community, doesn't wear at home; She was given pendulum exercise; She is on a no lifting restriction; She was told to just take it easy for now; She denies any numbness/tingling;    Pertinent History Personal factors affecting rehab: every day smoker; chronic shoulder pain with multiple injuries;    How long can you sit comfortably? sleeping in a recliner;    How long can you stand comfortably? NA   How long can you walk comfortably? NA   Diagnostic tests no recent scans; goes back to see MD next Tuesday June 13th   Patient Stated Goals "To be able to have full shoulder ROM of RUE; be able  to go back to work without limitations;  be able to swim, hang out with family;    Currently in Pain? Yes   Pain Score 2    Pain Location Shoulder   Pain Orientation Right;Anterior   Pain Descriptors / Indicators Throbbing;Sharp   Pain Type Surgical pain;Chronic pain   Pain Radiating Towards just in shoulder area   Pain Onset More than a month ago   Pain Frequency Intermittent   Aggravating Factors  worse at end of day; most pain at night; reaching, lifting her arm; unable to reach behind back   Pain Relieving Factors pain medication; ice (1-2x per day)   Effect of Pain on Daily Activities decreased mobility;    Multiple Pain Sites No            [facility] PT Assessment - 11/14/15 0001    Assessment   Medical Diagnosis Right shoulder pain s/p scope on 11/07/15   Referring Provider Dr. Onnie Graham   Onset Date/Surgical Date 11/07/15  pain original onset was Oct 2016   Hand Dominance Right   Next MD Visit 11/21/15   Prior Therapy had outpatient PT for 6-8 weeks for shoulder pain Nov-Dec 2016; She reports some improvement but would still have pain with reaching up;    Precautions   Precautions Shoulder   Type of Shoulder Precautions --  no lifting, limited ROM to tolerance;    Shoulder Interventions Shoulder sling/immobilizer   Restrictions   Weight Bearing Restrictions No   Balance Screen   Has the patient fallen in the past 6 months No   Has the patient had a decrease in activity level because of a fear of falling?  No   Is the patient reluctant to leave their home because of a fear of falling?  No   Home Environment   Additional Comments One level home; has 4 steps to enter front; side entry is 4 steps; has  B rails with steps; lives with family; dressing is mostly mod I; fiance will help with showers as needed; he will come hair; not doing any cooking/cleaning    Prior Function   Vocation --  out of work since May 5th; was full-time prior   [employer] worked as Marine scientist; was  working at [employer]; hoping to go back once shoulder is  better;    Leisure being able to pick up grand kids (56 yo, 69 yo, 65 yo); cooking;    Cognition   Overall Cognitive Status Within Functional Limits for tasks assessed   Observation/Other Assessments   Quick DASH  77.27% (the higher the score the greater disability)   Sensation   Light Touch Appears Intact   Additional Comments no numbness/tingling   Coordination   Gross Motor Movements are Fluid and Coordinated Yes   Fine Motor Movements are Fluid and Coordinated Yes   Posture/Postural Control   Posture Comments sits with right shoulder depressed as compared to left; slightly slumped with forward head, rounded shoulders; right shoulder more forward as compared to left; She often sits with RUE resting near stomach in a protective position;    AROM   Right Shoulder Extension 38 Degrees   Right Shoulder Flexion 60 Degrees   Right Shoulder ABduction 65 Degrees   Right Shoulder Internal Rotation 54 Degrees   Right Shoulder External Rotation 38 Degrees   Left Shoulder Extension 65 Degrees   Left Shoulder Flexion 175 Degrees   Left Shoulder ABduction 172 Degrees   Left Shoulder Internal Rotation 65 Degrees   Left Shoulder External Rotation 70 Degrees   PROM   Overall PROM  Within functional limits for tasks performed   Strength   Overall Strength Comments LUE grossly 5/5;   Right Shoulder Flexion 3-/5   Right Shoulder Extension 2+/5   Right Shoulder ABduction 3-/5   Right Shoulder Internal Rotation 3-/5   Right Shoulder External Rotation 2+/5   Palpation   Palpation comment very guarded with light touch assessment over right shoulder; moderate to severe tenderness to anterior and superior shoulder;   Transfers   Comments independent in sit<>Stand transfers; also independent in bed mobility;    Ambulation/Gait   Gait Comments ambulates independently without AD demonstrating normal gait pattern;    High Level Balance   High  Level Balance Comments independent in static and dynamic balance tasks;        TREATMENT: Initiated HEP: Wand shoulder exercise: Chest press x5 Shoulder flexion x5 RUE AAROM shoulder abduction x5; BUE shoulder IR behind back x5;  Educated patient to stay within pain free ROM; see patient instructions for pictures;                    PT Education - 11/14/15 1143    Education provided Yes   Education Details HEP initated; educated patient in plan of care and recommendations;  Person(s) Educated Patient   Methods Explanation;Verbal cues;Handout   Comprehension Verbalized understanding;Returned demonstration;Verbal cues required             PT Long Term Goals - 11/14/15 1150    PT LONG TERM GOAL #1   Title Patient will be independent in home exercise program to improve strength/mobility for better functional independence with ADLs.    Time 8   Period Weeks   Status New   PT LONG TERM GOAL #2   Title  Patient will decrease Quick DASH score by > 8 points demonstrating reduced self-reported upper extremity disability.   Time 8   Period Weeks   Status New   PT LONG TERM GOAL #3   Title Patient will improve shoulder AROM to > 150 degrees of flexion, scaption, and abduction for improved ability to perform overhead activities.   Time 8   Period Weeks   Status New   PT LONG TERM GOAL #4   Title Patient will report a worst pain of 3/10 on VAS in    right shoulder         to improve tolerance with ADLs and reduced symptoms with activities.    Time 8   Period Weeks   Status New   PT LONG TERM GOAL #5   Title Patient will improve RUE shoulder strength to >4/5 for improved strength with lifting and ADLs to return to PLOF.    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  She has had cortisone injections and conservative physical therapy for shoulder pain with some results. However, patient continued to have chronic pain. She is s/p right shoulder scope with SAD and DCR on 11/07/15. She currently wears a sling when out of home for protection. Patient exhibits limited RUE shoulder AROM with pain at end range flexion, abduction, extension and ER particularly; She also demonstrates increased weakness in RUE. She has pain along anterior and superior right shoulder. She tested at limited mobility with quick dash score;  She would benefit from skilled PT intervention to improve shoulder ROM, improve strength and reduce pain with ADLs.    Rehab Potential Good   Clinical Impairments Affecting Rehab Potential positive indicator: young in age, motivated, also has help at home; Negative indicator: chronic pain, smoker; Patient's clinical presentation is stable as her pain is localized to right shoulder and does not vary much;    PT Frequency Other (comment)  12 visits over 8 weeks   PT Duration 8 weeks   PT Treatment/Interventions ADLs/Self Care Home Management;Cryotherapy;Electrical Stimulation;Iontophoresis 4mg /ml Dexamethasone;Moist Heat;Therapeutic exercise;Therapeutic activities;Functional mobility training;Ultrasound;Neuromuscular re-education;Patient/family education;Manual techniques;Taping;Energy conservation;Dry needling;Passive range of motion   PT Next Visit Plan advance HEP, work on ROM and strengthening;    PT Home Exercise Plan initiated- see patient instructions;    Consulted and Agree with Plan of Care Patient      Patient will benefit from skilled therapeutic intervention in order to improve the following deficits and impairments:  Hypomobility, Decreased activity tolerance, Decreased strength, Pain, Impaired UE functional use, Decreased range of motion, Improper body mechanics, Postural dysfunction, Impaired flexibility  Visit Diagnosis: Pain in right shoulder  - Plan: PT plan of care cert/re-cert  Stiffness of right shoulder, not elsewhere  classified - Plan: PT plan of care cert/re-cert  Abnormal posture - Plan: PT plan of care cert/re-cert  Muscle weakness (generalized) - Plan: PT plan of care cert/re-cert     Problem List Patient Active Problem List   Diagnosis Date Noted  . Vitamin B12 deficiency anemia 12/01/2014  . Blood glucose elevated 12/01/2014  . Avitaminosis D 12/01/2014  . GU infection, trichomonal 12/01/2014  . Irregular bleeding 12/01/2014  . Anemia, iron deficiency 12/01/2014  . Essential (primary) hypertension 05/03/2009  . Essential hemorrhagic thrombocythemia ([place]) 03/03/2009  . Depression, neurotic 02/03/2009    Trotter,Margaret PT, DPT 11/14/2015, 11:55 AM  [place] MAIN [facility] SERVICES [address] [place], [place], [address] Phone: [phone]   Fax:  [phone]  Name: Anna Bates MRN: [id number] Date of Birth: 14-Jun-1965

## 2015-11-16 ENCOUNTER — Ambulatory Visit: Payer: PRIVATE HEALTH INSURANCE | Admitting: Physical Therapy

## 2015-11-16 ENCOUNTER — Encounter: Payer: Self-pay | Admitting: Physical Therapy

## 2015-11-16 DIAGNOSIS — M25611 Stiffness of right shoulder, not elsewhere classified: Secondary | ICD-10-CM

## 2015-11-16 DIAGNOSIS — M25511 Pain in right shoulder: Secondary | ICD-10-CM | POA: Diagnosis not present

## 2015-11-16 DIAGNOSIS — R293 Abnormal posture: Secondary | ICD-10-CM

## 2015-11-16 DIAGNOSIS — M6281 Muscle weakness (generalized): Secondary | ICD-10-CM

## 2015-11-16 NOTE — Patient Instructions (Addendum)
  Copyright  VHI. All rights reserved.   Roll   Inhale and bring shoulders up, back, then exhale and relax shoulders down. Repeat _10__ times. Do _2-3__ times per day.  Copyright  VHI. All rights reserved.    Scapular Retraction (Standing)    With arms at sides, pinch shoulder blades together. Hold for 3 sec Repeat __10__ times per set. Do _2___ sets per session. Do __2__ sessions per day.  http://orth.exer.us/945   Copyright  VHI. All rights reserved.  External Rotation (Isometric)    Place back of left fist against door frame, with elbow bent. Press fist against door frame. Hold __3__ seconds. Repeat _10___ times. Do __2__ sessions per day.  http://gt2.exer.us/110   Copyright  VHI. All rights reserved.  Flexion (Isometric)    Press right fist against wall. Hold __3__ seconds. Repeat __10__ times. Do __2__ sessions per day.  http://gt2.exer.us/114   Copyright  VHI. All rights reserved.  Internal Rotation (Isometric)    Place palm of right fist against door frame, with elbow bent. Press fist against door frame. Hold __3__ seconds. Repeat _10___ times. Do __2__ sessions per day.  http://gt2.exer.us/108   Copyright  VHI. All rights reserved.  SHOULDER: Abduction (Isometric)    Use wall as resistance. Press arm against pillow (elbow bent). Keep elbow straight. Hold _3__ seconds. _10__ reps per set, _2__ sets per day, __5_ days per week  Copyright  VHI. All rights reserved.

## 2015-11-16 NOTE — Therapy (Signed)
[place] MAIN [facility] SERVICES [address] [place], [place], [address] Phone: [phone]   Fax:  [phone]  Physical Therapy Treatment  Patient Details  Name: Anna Bates MRN: [id number] Date of Birth: [birth year] Referring Provider: Dr. Onnie Graham  Encounter Date: 11/16/2015      PT End of Session - 11/16/15 1516    Visit Number 2   Number of Visits 12   Date for PT Re-Evaluation 01/09/16   Authorization Type worker's comp approved 12 visits   PT Start Time 1115   PT Stop Time 1200   PT Time Calculation (min) 45 min   Activity Tolerance Patient tolerated treatment well;Patient limited by pain   Behavior During Therapy [facility] for tasks assessed/performed      History reviewed. No pertinent past medical history.  Past Surgical History  Procedure Laterality Date  . Shoulder surgery Right 05/09/2009  . Breast surgery  2002    Breast reduction -both  . Cholecystectomy  1991  . Cesarean section  1990  . Hernia repair  1980  . Right shoulder surgery Right 11/07/15    shoulder scope;     There were no vitals filed for this visit.      Subjective Assessment - 11/16/15 1116    Subjective Patient reports doing well with HEP using the bar for shoulder AAROM; Reports some stiffness and discomfort in RUE but not bad.    Pertinent History Personal factors affecting rehab: every day smoker; chronic shoulder pain with multiple injuries;    How long can you sit comfortably? sleeping in a recliner;    How long can you stand comfortably? NA   How long can you walk comfortably? NA   Diagnostic tests no recent scans; goes back to see MD next Tuesday June 13th   Patient Stated Goals "To be able to have full shoulder ROM of RUE; be able to go back to work without limitations;  be able to swim, hang out with family;    Currently in Pain? Yes   Pain Score 3    Pain Location Shoulder   Pain Orientation Right   Pain Descriptors / Indicators  Aching;Sore   Pain Type Chronic pain;Surgical pain   Pain Onset More than a month ago          TREATMENT: PT instructed patient in UE ranger exercise for RUE shoulder ROM: Seated: shoulder flexion/extension x10 AAROM Seated: shoulder horizontal abduction/adduction x10 AAROM; Standing: RUE shoulder flexion/extension x5, circles clockwise/counter x5 each with cues to avoid painful ROM; Patient required min VCs to reduce shoulder elevation with increased ROM for better glenohumeral joint movement;   Standing: RUE isometric flexion, abduction, IR/ER 3 sec hold x5 each with cues to avoid shoulder or trunk movement for true isometric exercise;  posterior shoulder rolls x10 Scapular retraction 2 sec hold x10;  Prone: RUE shoulder extension 1# x10 RUE shoulder low row 1# x10; RUE shoulder mid row no weight x10; Patient required min-moderate verbal/tactile cues for correct exercise technique including cues to increase scapular retraction and to avoid painful ROM for better strengthening;   PT performed PROM of RUE shoulder in all directions with patient supine x5 each; able to demonstrate better PROM to [facility] as compared to [facility];  PT performed grade I-II inferior and PA glenohumeral joint mobs to RUE 5 sec bouts x3 each;  PT applied ice to right shoulder in sitting at end of treatment session x5 min (unbilled)  PT Education - 11/16/15 1514    Education provided Yes   Education Details HEP advanced, shoulder ROM/strengthening;    Person(s) Educated Patient   Methods Explanation;Verbal cues;Handout   Comprehension Verbalized understanding;Returned demonstration;Verbal cues required             PT Long Term Goals - 11/14/15 1150    PT LONG TERM GOAL #1   Title Patient will be independent in home exercise program to improve strength/mobility for better functional independence with ADLs.    Time 8   Period Weeks   Status New   PT LONG TERM  GOAL #2   Title  Patient will decrease Quick DASH score by > 8 points demonstrating reduced self-reported upper extremity disability.   Time 8   Period Weeks   Status New   PT LONG TERM GOAL #3   Title Patient will improve shoulder AROM to > 150 degrees of flexion, scaption, and abduction for improved ability to perform overhead activities.   Time 8   Period Weeks   Status New   PT LONG TERM GOAL #4   Title Patient will report a worst pain of 3/10 on VAS in    right shoulder         to improve tolerance with ADLs and reduced symptoms with activities.    Time 8   Period Weeks   Status New   PT LONG TERM GOAL #5   Title Patient will improve RUE shoulder strength to >4/5 for improved strength with lifting and ADLs to return to PLOF.    Time 8   Period Weeks   Status New               Plan - 11/16/15 1517    Clinical Impression Statement Instructed patient in advanced RUE shoulder AAROM and strengthening exercise; Patient required min VCs to avoid shoulder elevation with increased shoulder flexion/abduction AAROM. Patient responded well to cues; She did have some soreness which was relieved with cryotherapy at end of treatment session; She would benefit from additional skilled PT Intervention to improve shoulder ROM, reduce pain with ADLs and return to PLOF.    Rehab Potential Good   Clinical Impairments Affecting Rehab Potential positive indicator: young in age, motivated, also has help at home; Negative indicator: chronic pain, smoker; Patient's clinical presentation is stable as her pain is localized to right shoulder and does not vary much;    PT Frequency Other (comment)  12 visits over 8 weeks   PT Duration 8 weeks   PT Treatment/Interventions ADLs/Self Care Home Management;Cryotherapy;Electrical Stimulation;Iontophoresis 4mg /ml Dexamethasone;Moist Heat;Therapeutic exercise;Therapeutic activities;Functional mobility training;Ultrasound;Neuromuscular re-education;Patient/family  education;Manual techniques;Taping;Energy conservation;Dry needling;Passive range of motion   PT Next Visit Plan advance HEP, work on ROM and strengthening;    PT Home Exercise Plan advanced, see patient instructions;    Consulted and Agree with Plan of Care Patient      Patient will benefit from skilled therapeutic intervention in order to improve the following deficits and impairments:  Hypomobility, Decreased activity tolerance, Decreased strength, Pain, Impaired UE functional use, Decreased range of motion, Improper body mechanics, Postural dysfunction, Impaired flexibility  Visit Diagnosis: Pain in right shoulder  Stiffness of right shoulder, not elsewhere classified  Abnormal posture  Muscle weakness (generalized)     Problem List Patient Active Problem List   Diagnosis Date Noted  . Vitamin B12 deficiency anemia 12/01/2014  . Blood glucose elevated 12/01/2014  . Avitaminosis D 12/01/2014  . GU infection, trichomonal 12/01/2014  .  Irregular bleeding 12/01/2014  . Anemia, iron deficiency 12/01/2014  . Essential (primary) hypertension 05/03/2009  . 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 Depression, neurotic 02/03/2009    Rana Hochstein PT, DPT 11/16/2015, 3:20 PM  [place] MAIN [facility] SERVICES [address] [place], [place], [address] Phone: [phone]   Fax:  [phone]  Name: Anna Bates MRN: [id number] Date of Birth: [birth year]

## 2015-11-21 ENCOUNTER — Ambulatory Visit: Payer: PRIVATE HEALTH INSURANCE | Admitting: Physical Therapy

## 2015-11-21 DIAGNOSIS — M25511 Pain in right shoulder: Secondary | ICD-10-CM | POA: Diagnosis not present

## 2015-11-21 DIAGNOSIS — R293 Abnormal posture: Secondary | ICD-10-CM

## 2015-11-21 DIAGNOSIS — M6281 Muscle weakness (generalized): Secondary | ICD-10-CM

## 2015-11-21 DIAGNOSIS — M25611 Stiffness of right shoulder, not elsewhere classified: Secondary | ICD-10-CM

## 2015-11-21 NOTE — Therapy (Signed)
[place] MAIN [facility] SERVICES [address] [place], [place], [address] Phone: [phone]   Fax:  [phone]  Physical Therapy Treatment  Patient Details  Name: Anna Bates MRN: [id number] Date of Birth: 1965/10/25 Referring Provider: Dr. Onnie Graham  Encounter Date: 11/21/2015      PT End of Session - 11/21/15 1200    Visit Number 3   Number of Visits 12   Date for PT Re-Evaluation 01/09/16   Authorization Type worker's comp approved 12 visits   PT Start Time 1115   PT Stop Time 1200   PT Time Calculation (min) 45 min   Activity Tolerance Patient tolerated treatment well;No increased pain   Behavior During Therapy [facility] for tasks assessed/performed      No past medical history on file.  Past Surgical History  Procedure Laterality Date  . Shoulder surgery Right 05/09/2009  . Breast surgery  2002    Breast reduction -both  . Cholecystectomy  1991  . Cesarean section  1990  . Hernia repair  1980  . Right shoulder surgery Right 11/07/15    shoulder scope;     There were no vitals filed for this visit.      Subjective Assessment - 11/21/15 1119    Subjective Pt reports "a little pain today" but that she is fine.  Reported going to ortho dr. yesterday who was pleased with ROM and recommended to continue PT.  Home program is going well.     Pertinent History Personal factors affecting rehab: every day smoker; chronic shoulder pain with multiple injuries;    How long can you sit comfortably? sleeping in a recliner;    How long can you stand comfortably? NA   How long can you walk comfortably? NA   Diagnostic tests no recent scans; goes back to see MD next Tuesday June 13th   Patient Stated Goals "To be able to have full shoulder ROM of RUE; be able to go back to work without limitations;  be able to swim, hang out with family;    Currently in Pain? Yes   Pain Score 5    Pain Location Shoulder   Pain Orientation Right   Pain  Descriptors / Indicators Sore   Pain Type Chronic pain;Surgical pain   Pain Onset More than a month ago   Multiple Pain Sites No       Treatment   UE arm bike at level 2.0, 2 mins forward, 2 mins backward for warm up   AAROM with 1.5lb wand for flexion, abduction, and ER x 15 reps for each, min VCs required for positioning and min tactile cues to decrease compensation   Behind the back AAROM with towel for 15 reps, min VCs to increase ROM  Rotator cuff resisted abduction with yellow theraband ascending against wall x 15 reps, min VCs for upright standing posture   Stability wall ball circles with 1lb ball 30 reps clockwise, and counter clockwise, tactile cues for elbow extension   Low resisted rows with yellow theraband, 2 sets x 15 reps, min VCs for scapula retraction and maintaining correct positioning   Rhythmic stabilization in supine, 3 sets x  30 seconds starting more proximal and ending distally   Supine serratus punches, 2 sets x 15 reps, supporting R shoulder with towel roll    Reemphasized HEP and added behind back towel AAROM and low resisted rows  PT Education - 11/21/15 1158    Education provided Yes   Education Details addition to HEP, posture while driving, continue icing    Person(s) Educated Patient   Methods Explanation;Demonstration;Verbal cues   Comprehension Verbalized understanding;Returned demonstration             PT Long Term Goals - 11/14/15 1150    PT LONG TERM GOAL #1   Title Patient will be independent in home exercise program to improve strength/mobility for better functional independence with ADLs.    Time 8   Period Weeks   Status New   PT LONG TERM GOAL #2   Title  Patient will decrease Quick DASH score by > 8 points demonstrating reduced self-reported upper extremity disability.   Time 8   Period Weeks   Status New   PT LONG TERM GOAL #3   Title Patient will improve shoulder AROM  to > 150 degrees of flexion, scaption, and abduction for improved ability to perform overhead activities.   Time 8   Period Weeks   Status New   PT LONG TERM GOAL #4   Title Patient will report a worst pain of 3/10 on VAS in    right shoulder         to improve tolerance with ADLs and reduced symptoms with activities.    Time 8   Period Weeks   Status New   PT LONG TERM GOAL #5   Title Patient will improve RUE shoulder strength to >4/5 for improved strength with lifting and ADLs to return to PLOF.    Time 8   Period Weeks   Status New               Plan - 11/21/15 1200    Clinical Impression Statement Pt reported decrease in pain/soreness at end of therapy session.  Pt seemed pleased with the progression of her ROM and seems to be doing well with her HEP.  Pt required min VCs for posture and retraction with rows as well as tactile cues for shoulder compensation with AAROM.  Pt reported that scapular depression exercise with theraband assist was painful and deferred exercise.  Pt performed rhythmic stabilization and supine punches without complaints of pain or discomfort.  Pt would continue to benefit from skilled physical therapy to increase active R shoulder ROM, RC strengthening and stability for greater functional mobility.    Rehab Potential Good   Clinical Impairments Affecting Rehab Potential positive indicator: young in age, motivated, also has help at home; Negative indicator: chronic pain, smoker; Patient's clinical presentation is stable as her pain is localized to right shoulder and does not vary much;    PT Frequency Other (comment)  12 visits over 8 weeks   PT Duration 8 weeks   PT Treatment/Interventions ADLs/Self Care Home Management;Cryotherapy;Electrical Stimulation;Iontophoresis 4mg /ml Dexamethasone;Moist Heat;Therapeutic exercise;Therapeutic activities;Functional mobility training;Ultrasound;Neuromuscular re-education;Patient/family education;Manual  techniques;Taping;Energy conservation;Dry needling;Passive range of motion   PT Next Visit Plan postural strengthening, RC ER sidelying, rows, scapular depression, scap rhythmic stabilization    PT Home Exercise Plan advanced, see patient instructions;    Consulted and Agree with Plan of Care Patient      Patient will benefit from skilled therapeutic intervention in order to improve the following deficits and impairments:  Hypomobility, Decreased activity tolerance, Decreased strength, Pain, Impaired UE functional use, Decreased range of motion, Improper body mechanics, Postural dysfunction, Impaired flexibility  Visit Diagnosis: Pain in right shoulder  Stiffness of right shoulder, not elsewhere classified  Abnormal  posture  Muscle weakness (generalized)     Problem List Patient Active Problem List   Diagnosis Date Noted  . Vitamin B12 deficiency anemia 12/01/2014  . Blood glucose elevated 12/01/2014  . Avitaminosis D 12/01/2014  . GU infection, trichomonal 12/01/2014  . Irregular bleeding 12/01/2014  . Anemia, iron deficiency 12/01/2014  . Essential (primary) hypertension 05/03/2009  . 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 Depression, neurotic 02/03/2009   Stacy Gardner, SPT   Trotter,Margaret PT, DPT 11/21/2015, 2:52 PM  [place] MAIN [facility] SERVICES [address] [place], [place], [address] Phone: [phone]   Fax:  [phone]  Name: Anna Bates MRN: [id number] Date of Birth: May 02, 1966

## 2015-11-21 NOTE — Patient Instructions (Signed)
Behind Back    Reach hand up back. Keep shoulders down. Use towel roll to assist bringing hand up back.   Hint: Perform in front of mirror for feedback.  __15_ reps per set, _2__ sets per day, use towel to assist behind back   Copyright  VHI. All rights reserved.  Behind Back     Copyright  VHI. All rights reserved.  Scapular Retraction: Rowing (Eccentric) - Arms - 45 Degrees (Resistance Band)    Hold end of band in each hand. Pull back until elbows are even with trunk. Keep elbows out from sides at 45, thumbs up. Slowly release for 3-5 seconds. Use ___yellow_____ resistance band. _15__ reps per set, _1__ sets per day, __5_ days per week. Place resistance band in door, make sure to roll shoulders down and back before beginning.  Elbows stay by your side.     http://ecce.exer.us/229   Copyright  VHI. All rights reserved.

## 2015-11-23 ENCOUNTER — Encounter: Payer: Self-pay | Admitting: Physical Therapy

## 2015-11-23 ENCOUNTER — Ambulatory Visit: Payer: PRIVATE HEALTH INSURANCE | Admitting: Physical Therapy

## 2015-11-23 DIAGNOSIS — R293 Abnormal posture: Secondary | ICD-10-CM

## 2015-11-23 DIAGNOSIS — M25511 Pain in right shoulder: Secondary | ICD-10-CM

## 2015-11-23 DIAGNOSIS — M6281 Muscle weakness (generalized): Secondary | ICD-10-CM

## 2015-11-23 DIAGNOSIS — M25611 Stiffness of right shoulder, not elsewhere classified: Secondary | ICD-10-CM

## 2015-11-23 NOTE — Therapy (Signed)
[place] MAIN [facility] SERVICES [address] [place], [place], [address] Phone: [phone]   Fax:  [phone]  Physical Therapy Treatment  Patient Details  Name: Anna Bates MRN: [id number] Date of Birth: 12/31/1965 Referring Provider: Dr. Onnie Graham  Encounter Date: 11/23/2015      PT End of Session - 11/23/15 1320    Visit Number 4   Number of Visits 12   Date for PT Re-Evaluation 01/09/16   Authorization Type worker's comp approved 12 visits   PT Start Time 1115   PT Stop Time 1200   PT Time Calculation (min) 45 min   Activity Tolerance Patient tolerated treatment well;No increased pain   Behavior During Therapy [facility] for tasks assessed/performed      History reviewed. No pertinent past medical history.  Past Surgical History  Procedure Laterality Date  . Shoulder surgery Right 05/09/2009  . Breast surgery  2002    Breast reduction -both  . Cholecystectomy  1991  . Cesarean section  1990  . Hernia repair  1980  . Right shoulder surgery Right 11/07/15    shoulder scope;     There were no vitals filed for this visit.      Subjective Assessment - 11/23/15 1120    Subjective "I am doing pretty well today" Patient reports compliance with HEP;    Pertinent History Personal factors affecting rehab: every day smoker; chronic shoulder pain with multiple injuries;    How long can you sit comfortably? sleeping in a recliner;    How long can you stand comfortably? NA   How long can you walk comfortably? NA   Diagnostic tests no recent scans; goes back to see MD next Tuesday June 13th   Patient Stated Goals "To be able to have full shoulder ROM of RUE; be able to go back to work without limitations;  be able to swim, hang out with family;    Currently in Pain? Yes   Pain Score 2    Pain Location Shoulder   Pain Orientation Right   Pain Descriptors / Indicators Aching;Sore   Pain Type Chronic pain;Surgical pain   Pain Onset More  than a month ago        TREATMENT: Standing: Pball on wall up/down x10 Pball on wall circles clockwise/counterclockwise x5 each with BUE Pball push up with scapular protraction 2x10; Patient required min VCs to increase push through UE for better scapular strengthening;  Yellow tband RUE shoulder ER 2x10; Yellow tband BUE shoulder rows (low) 2x10;  BUE overhead "V" with lift off 2x5 with cues to increase scapular retraction and avoid painful ROM with RUE; Patient had increased difficulty with RUE due to fatigue and pain;   Prone: RUE shoulder extension 1# x15 RUE shoulder low row 1# x15; RUE shoulder mid row no weight x12; Patient required min-moderate verbal/tactile cues for correct exercise technique including cues to increase scapular retraction and to avoid painful ROM for better strengthening;  PT performed grade II-III PA mobs to right glenohumeral joint in prone 10 sec bouts x3;  Patient in left sidelying: RUE shoulder ER 2# 2x10 with min VCs to keep elbow by side; RUE shoulder flexion AROM 2-1# 2x10 with min VCs and tactile cues for positioning; therapist also assisted with scapular elevation to assist with shoulder flexion;   Patient supine: RUE shoulder protraction with 90 degrees flexion holding red weighted ball 2x10; RUE shoulder small circles with elbow extended and UE in 90 degrees flexion clockwise/counterclockwise  2x5 while holding red weighted ball (1000 gram) RUE PNF D1/D2 yellow tband x10 each; Patient required mod VCs for correct positioning and UE movement during supine exercise; She also required min VCs to keep elbow extended during protraction and circles; Patient reports increased difficulty with D2 tband exercise;  Advanced HEP- see patient instructions;  PT applied ice to right shoulder in sitting at end of treatment session x5 min (unbilled)        [facility] PT Assessment - 11/23/15 0001    AROM   Right Shoulder Flexion 145 Degrees   Right Shoulder  ABduction 110 Degrees   Right Shoulder Internal Rotation 80 Degrees   Right Shoulder External Rotation 65 Degrees                             PT Education - 11/23/15 1320    Education provided Yes   Education Details HEP advanced, strengthening/ROM   Person(s) Educated Patient   Methods Explanation;Demonstration;Verbal cues;Handout   Comprehension Verbalized understanding;Returned demonstration;Verbal cues required             PT Long Term Goals - 11/14/15 1150    PT LONG TERM GOAL #1   Title Patient will be independent in home exercise program to improve strength/mobility for better functional independence with ADLs.    Time 8   Period Weeks   Status New   PT LONG TERM GOAL #2   Title  Patient will decrease Quick DASH score by > 8 points demonstrating reduced self-reported upper extremity disability.   Time 8   Period Weeks   Status New   PT LONG TERM GOAL #3   Title Patient will improve shoulder AROM to > 150 degrees of flexion, scaption, and abduction for improved ability to perform overhead activities.   Time 8   Period Weeks   Status New   PT LONG TERM GOAL #4   Title Patient will report a worst pain of 3/10 on VAS in    right shoulder         to improve tolerance with ADLs and reduced symptoms with activities.    Time 8   Period Weeks   Status New   PT LONG TERM GOAL #5   Title Patient will improve RUE shoulder strength to >4/5 for improved strength with lifting and ADLs to return to PLOF.    Time 8   Period Weeks   Status New               Plan - 11/23/15 1320    Clinical Impression Statement Instructed patient in advanced BUE strengthening with focus on RUE shoulder strengthening; Patient demonstrates significant improvement in RUE shoulder ROM; However she is still limited and reports pain with shoulder flexion, abduction and ER; Patient able to demonstrate better shoulder ROM with less shoulder elevation; She reports increased  discomfort with advanced resistance during sidelying/supine exercise. Patient would benefit from additional skilled PT intervention to improve shoulder ROM and reduce pain with ADLs;    Rehab Potential Good   Clinical Impairments Affecting Rehab Potential positive indicator: young in age, motivated, also has help at home; Negative indicator: chronic pain, smoker; Patient's clinical presentation is stable as her pain is localized to right shoulder and does not vary much;    PT Frequency Other (comment)  12 visits over 8 weeks   PT Duration 8 weeks   PT Treatment/Interventions ADLs/Self Care Home Management;Cryotherapy;Electrical Stimulation;Iontophoresis 4mg /ml Dexamethasone;Moist Heat;Therapeutic  exercise;Therapeutic activities;Functional mobility training;Ultrasound;Neuromuscular re-education;Patient/family education;Manual techniques;Taping;Energy conservation;Dry needling;Passive range of motion   PT Next Visit Plan postural strengthening, RC ER sidelying, rows, scapular depression, scap rhythmic stabilization    PT Home Exercise Plan advanced, see patient instructions;    Consulted and Agree with Plan of Care Patient      Patient will benefit from skilled therapeutic intervention in order to improve the following deficits and impairments:  Hypomobility, Decreased activity tolerance, Decreased strength, Pain, Impaired UE functional use, Decreased range of motion, Improper body mechanics, Postural dysfunction, Impaired flexibility  Visit Diagnosis: Pain in right shoulder  Stiffness of right shoulder, not elsewhere classified  Abnormal posture  Muscle weakness (generalized)     Problem List Patient Active Problem List   Diagnosis Date Noted  . Vitamin B12 deficiency anemia 12/01/2014  . Blood glucose elevated 12/01/2014  . Avitaminosis D 12/01/2014  . GU infection, trichomonal 12/01/2014  . Irregular bleeding 12/01/2014  . Anemia, iron deficiency 12/01/2014  . Essential (primary)  hypertension 05/03/2009  . 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 Depression, neurotic 02/03/2009    Trotter,Margaret PT, DPT 11/23/2015, 1:23 PM  [place] MAIN [facility] SERVICES [address] [place], [place], [address] Phone: [phone]   Fax:  [phone]  Name: SICLALI JUVERA MRN: [id number] Date of Birth: Mar 11, 1966

## 2015-11-23 NOTE — Patient Instructions (Addendum)
Strengthening: Resisted Extension   Hold band in both hands, Pull arm back, elbow straight, squeezing shoulder blades, Repeat _10___ times per set. Do _2___ sets per session. Do __1__ sessions per day.  http://orth.exer.us/833     Roll   Inhale and bring shoulders up, back, then exhale and relax shoulders down. Repeat _10__ times. Do _2-3__ times per day.  Copyright  VHI. All rights reserved.    Wall Slide With Cervical Neutral / Upward Scapular Rotation: Standing    Elbows bent, little fingers on wall, thumbs out. Slide arms up wall, then keeping elbows straight, try to lift one hand off wall and then the other (avoid pain) Do __5_ times, __2-3_ times per day.  http://ss.exer.us/225   Copyright  VHI. All rights reserved.  Progressive Resisted: External Rotation (Side-Lying)    Holding __1__ pound weight, towel under arm, raise right forearm toward ceiling. Keep elbow bent and at side. Repeat __10-15__ times per set. Do _2___ sets per session. Do _2___ sessions per day.  http://orth.exer.us/879   Copyright  VHI. All rights reserved.  Strengthening: Resisted External Rotation    Hold tubing in right hand, elbow at side and forearm across body. Rotate forearm out. Repeat __10__ times per set. Do _2___ sets per session. Do __2__ sessions per day.  http://orth.exer.us/829   Copyright  VHI. All rights reserved.

## 2015-11-28 ENCOUNTER — Encounter: Payer: Self-pay | Admitting: Physical Therapy

## 2015-11-28 ENCOUNTER — Ambulatory Visit: Payer: PRIVATE HEALTH INSURANCE | Admitting: Physical Therapy

## 2015-11-28 DIAGNOSIS — M25511 Pain in right shoulder: Secondary | ICD-10-CM | POA: Diagnosis not present

## 2015-11-28 DIAGNOSIS — M6281 Muscle weakness (generalized): Secondary | ICD-10-CM

## 2015-11-28 DIAGNOSIS — R293 Abnormal posture: Secondary | ICD-10-CM

## 2015-11-28 DIAGNOSIS — M25611 Stiffness of right shoulder, not elsewhere classified: Secondary | ICD-10-CM

## 2015-11-28 NOTE — Therapy (Signed)
[place] MAIN [facility] SERVICES [address] [place], [place], [address] Phone: [phone]   Fax:  [phone]  Physical Therapy Treatment  Patient Details  Name: Anna Bates MRN: [id number] Date of Birth: 10/25/1965 Referring Provider: Dr. Onnie Graham  Encounter Date: 11/28/2015      PT End of Session - 11/28/15 1131    Visit Number 5   Number of Visits 12   Date for PT Re-Evaluation 01/09/16   Authorization Type worker's comp approved 12 visits   PT Start Time [id number]   PT Stop Time 1120   PT Time Calculation (min) 48 min   Activity Tolerance Patient limited by pain   Behavior During Therapy [facility] for tasks assessed/performed      History reviewed. No pertinent past medical history.  Past Surgical History  Procedure Laterality Date  . Shoulder surgery Right 05/09/2009  . Breast surgery  2002    Breast reduction -both  . Cholecystectomy  1991  . Cesarean section  1990  . Hernia repair  1980  . Right shoulder surgery Right 11/07/15    shoulder scope;     There were no vitals filed for this visit.      Subjective Assessment - 11/28/15 1041    Subjective Patient reports increased right shoulder pain today; "I am still not able to ly on my right side and its just painful if my arm comes forward." Patient reports increased pain since last session with increased throbbing; She is unsure if that is weather related or due to advanced exercise;    Pertinent History Personal factors affecting rehab: every day smoker; chronic shoulder pain with multiple injuries;    How long can you sit comfortably? sleeping in a recliner;    How long can you stand comfortably? NA   How long can you walk comfortably? NA   Diagnostic tests no recent scans; goes back to see MD next Tuesday June 13th   Patient Stated Goals "To be able to have full shoulder ROM of RUE; be able to go back to work without limitations;  be able to swim, hang out with family;     Currently in Pain? Yes   Pain Score 5    Pain Location Shoulder   Pain Orientation Right   Pain Descriptors / Indicators Throbbing   Pain Type Chronic pain;Surgical pain   Pain Onset More than a month ago        TREATMENT: Warm up on UBE BUE backwards only level 2 x3 min (unbilled); Instructed patient in doorway stretch however patient reports immediate increase in RUE pain with position;  PT applied McConnell tape to RUE for shoulder retraction and ER to facilitate better RUE glenohumeral joint position for less pain with movement; Educated patient on safe tape wear for reduced skin discomfort;  Standing: Modified doorway stretch with arms down by side 15 sec hold x2; Seated right scalene/SCM stretch 15 sec hold x2 with cues for cervical position including to increase chin tuck for better stretch; R upper trap stretch 15 sec hold x2 with cues to keep shoulder down for better upper trap stretch;   Red tband RUE shoulder ER 2x10; Red tband RUE shoulder extension 2x10; Patient required min-mod VCs to avoid shoulder elevation with shoulder ER for better glenohumeral joint kinematics and to improve true shoulder joint ROM.   Patient in left sidelying: RUE shoulder ER 2# 2x10 with min VCs to keep elbow by side;  Advanced HEP- see patient instructions;  Recommended patient hold off on towel shoulder IR stretch due to risk of further impingement and discomfort; Educated patient on importance of avoiding painful ROM with UE strengthening;  PT applied ice to right shoulder in sitting at end of treatment session x5 min (unbilled)                          PT Education - 11/28/15 1130    Education provided Yes   Education Details shoulder positioning, ROM/strengthening, HEP advanced   Person(s) Educated Patient   Methods Explanation;Demonstration;Verbal cues;Handout   Comprehension Verbalized understanding;Returned demonstration;Verbal cues required              PT Long Term Goals - 11/14/15 1150    PT LONG TERM GOAL #1   Title Patient will be independent in home exercise program to improve strength/mobility for better functional independence with ADLs.    Time 8   Period Weeks   Status New   PT LONG TERM GOAL #2   Title  Patient will decrease Quick DASH score by > 8 points demonstrating reduced self-reported upper extremity disability.   Time 8   Period Weeks   Status New   PT LONG TERM GOAL #3   Title Patient will improve shoulder AROM to > 150 degrees of flexion, scaption, and abduction for improved ability to perform overhead activities.   Time 8   Period Weeks   Status New   PT LONG TERM GOAL #4   Title Patient will report a worst pain of 3/10 on VAS in    right shoulder         to improve tolerance with ADLs and reduced symptoms with activities.    Time 8   Period Weeks   Status New   PT LONG TERM GOAL #5   Title Patient will improve RUE shoulder strength to >4/5 for improved strength with lifting and ADLs to return to PLOF.    Time 8   Period Weeks   Status New               Plan - 11/28/15 1131    Clinical Impression Statement Patient reports increased throbbing pain in RUE today as compared to last few weeks. PT applied Mcconnell tape to RUE to faciliate better glenohumeral joint positioning for less impingement; Patient reports less discomfort with tape; She required min-mod VCs to reduce shoulder elevation/protraction with UE movement; Patient reports increased discomfort with eccentric lower from overhead exercise. Recommended patient avoid excessive shoulder IR stretch to reduce shoulder discomfort; Patient would benefit from additional skilled PT intervention to reduce shoulder pain and return to PLOF.    Rehab Potential Good   Clinical Impairments Affecting Rehab Potential positive indicator: young in age, motivated, also has help at home; Negative indicator: chronic pain, smoker; Patient's clinical presentation is  stable as her pain is localized to right shoulder and does not vary much;    PT Frequency Other (comment)  12 visits over 8 weeks   PT Duration 8 weeks   PT Treatment/Interventions ADLs/Self Care Home Management;Cryotherapy;Electrical Stimulation;Iontophoresis 4mg /ml Dexamethasone;Moist Heat;Therapeutic exercise;Therapeutic activities;Functional mobility training;Ultrasound;Neuromuscular re-education;Patient/family education;Manual techniques;Taping;Energy conservation;Dry needling;Passive range of motion   PT Next Visit Plan postural strengthening, RC ER sidelying, rows, scapular depression, scap rhythmic stabilization    PT Home Exercise Plan advanced, see patient instructions;    Consulted and Agree with Plan of Care Patient      Patient will benefit from skilled therapeutic intervention in  order to improve the following deficits and impairments:  Hypomobility, Decreased activity tolerance, Decreased strength, Pain, Impaired UE functional use, Decreased range of motion, Improper body mechanics, Postural dysfunction, Impaired flexibility  Visit Diagnosis: Pain in right shoulder  Stiffness of right shoulder, not elsewhere classified  Abnormal posture  Muscle weakness (generalized)     Problem List Patient Active Problem List   Diagnosis Date Noted  . Vitamin B12 deficiency anemia 12/01/2014  . Blood glucose elevated 12/01/2014  . Avitaminosis D 12/01/2014  . GU infection, trichomonal 12/01/2014  . Irregular bleeding 12/01/2014  . Anemia, iron deficiency 12/01/2014  . Essential (primary) hypertension 05/03/2009  . 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 Depression, neurotic 02/03/2009    Shajuan Musso PT, DPT 11/28/2015, 1:39 PM  [place] [facility] MAIN [facility] SERVICES [address] [place], [place], [address] Phone: [phone]   Fax:  [phone]  Name: LOWANDA GILNER MRN: [id number] Date of Birth:  [birth year]

## 2015-11-28 NOTE — Patient Instructions (Addendum)
Side Bend, Sitting    Sit, one hand grasping other arm above wrist. Pull down (or have hand holding bottom of chair) and across body while gently tilting head toward pulling side. Hold __10-15_ seconds. For more stretch, lean body in direction of head tilt. Repeat __2-3_ times per session. Do _2__ sessions per day.  Copyright  VHI. All rights reserved.  Side Bend, Sitting    Tilt head to left side, then tuck chin and rotate head to right to feel stretch along right side of neck. Hold _10-15__ seconds.  Repeat __2-3_ times per session. Do __2_ sessions per day.  Copyright  VHI. All rights reserved.  CHEST: Doorway, Bilateral - Standing    Standing in doorway, place hands on wall with arms down. Step into doorway Lean forward (you should feel a stretch along chest muscles and front of shoulder, not on top of shoulder). Hold _10-15__ seconds. _2-3__ reps per set, _2__ sets per day, _5__ days per week  Copyright  VHI. All rights reserved.

## 2015-12-01 ENCOUNTER — Ambulatory Visit: Payer: PRIVATE HEALTH INSURANCE | Admitting: Physical Therapy

## 2015-12-01 ENCOUNTER — Encounter: Payer: Self-pay | Admitting: Physical Therapy

## 2015-12-01 DIAGNOSIS — M25611 Stiffness of right shoulder, not elsewhere classified: Secondary | ICD-10-CM

## 2015-12-01 DIAGNOSIS — R293 Abnormal posture: Secondary | ICD-10-CM

## 2015-12-01 DIAGNOSIS — M25511 Pain in right shoulder: Secondary | ICD-10-CM | POA: Diagnosis not present

## 2015-12-01 NOTE — Therapy (Signed)
[place] MAIN [facility] SERVICES [place], [place], [address] Phone: [phone]   Fax:  [phone]  Physical Therapy Treatment  Patient Details  Name: Anna Bates MRN: [id number] Date of Birth: Feb 12, 1966 Referring Provider: Dr. Onnie Graham  Encounter Date: 12/01/2015      PT End of Session - 12/01/15 1011    Visit Number 6   Number of Visits 12   Date for PT Re-Evaluation 01/09/16   Authorization Type worker's comp approved 12 visits   PT Start Time 0849   PT Stop Time 0933   PT Time Calculation (min) 44 min   Activity Tolerance Patient limited by pain   Behavior During Therapy [facility] for tasks assessed/performed      History reviewed. No pertinent past medical history.  Past Surgical History  Procedure Laterality Date  . Shoulder surgery Right 05/09/2009  . Breast surgery  2002    Breast reduction -both  . Cholecystectomy  1991  . Cesarean section  1990  . Hernia repair  1980  . Right shoulder surgery Right 11/07/15    shoulder scope;     There were no vitals filed for this visit.      Subjective Assessment - 12/01/15 1010    Subjective Patient reports similar right shoulder pain today; difficulty sleeping because having troubles getting comfortable due to pain with shoulder movement.   Pertinent History Personal factors affecting rehab: every day smoker; chronic shoulder pain with multiple injuries;    How long can you sit comfortably? sleeping in a recliner;    How long can you stand comfortably? NA   How long can you walk comfortably? NA   Diagnostic tests no recent scans; goes back to see MD next Tuesday June 13th   Patient Stated Goals "To be able to have full shoulder ROM of RUE; be able to go back to work without limitations;  be able to swim, hang out with family;    Currently in Pain? Yes   Pain Score 3    Pain Location Shoulder   Pain Orientation Right;Anterior;Upper   Pain Descriptors / Indicators  Stabbing   Pain Type Chronic pain;Surgical pain   Pain Radiating Towards shoulder only   Pain Onset More than a month ago   Pain Frequency Intermittent   Multiple Pain Sites No      Treatment:   Warm up on UE bike; BUE backwards only; level 1 x3 min (unbilled);  Previous to taping; patient instructed in sidelying RUE shoulder ER 2#; 2 x 10 reps, with min VCs to perform slowly through ROM to allow for greater strengthening. Sidelying shoulder flexion; 2 x 10, #1 hand weight; patient reports pain when returning from shoulder flexion overhead, VCs to perform within painless range; no reports of pain after instruction.  PT applied McConnell tape to RUE for shoulder retraction and ER to facilitate better RUE glenohumeral joint position for less pain with movement; Continued education to patient on safe tape wear for reduced skin discomfort;  Prone:  RUE extension, low row, and mid row; #2 at 2 x 10 all directions, rest breaks in between. Instruction to perform increased scapular retraction during rowing motions.    Standing: Y slides on wall; x12 slight increase in pain; instruction on posture to decrease pain. Red tband RUE shoulder ER 2x10;  Red tband RUE shoulder extension 2x10; Patient required min-mod VCs to perform with scapular contraction in order to allow for stability and improved glenohumeral placement.  Reclined Position: Open chain dynamic stabilization; x3 with multiple pushes, 2 finger resistance from SPT, instructed to perform with scapular retraction; decreased pain after cueing.    Seated: Scapular retraction with red tband, 2 x 10; instructed to squeeze shoulder blades together and allow opening of pec. Major muscles.   After treatment patient reported she felt better so that she didn't require ice to ease pain.                          PT Education - 12/01/15 1011    Education provided Yes   Education Details posture, shoulder positioning,  strengthening   Person(s) Educated Patient   Methods Explanation;Demonstration;Verbal cues   Comprehension Verbalized understanding;Returned demonstration             PT Long Term Goals - 11/14/15 1150    PT LONG TERM GOAL #1   Title Patient will be independent in home exercise program to improve strength/mobility for better functional independence with ADLs.    Time 8   Period Weeks   Status New   PT LONG TERM GOAL #2   Title  Patient will decrease Quick DASH score by > 8 points demonstrating reduced self-reported upper extremity disability.   Time 8   Period Weeks   Status New   PT LONG TERM GOAL #3   Title Patient will improve shoulder AROM to > 150 degrees of flexion, scaption, and abduction for improved ability to perform overhead activities.   Time 8   Period Weeks   Status New   PT LONG TERM GOAL #4   Title Patient will report a worst pain of 3/10 on VAS in    right shoulder         to improve tolerance with ADLs and reduced symptoms with activities.    Time 8   Period Weeks   Status New   PT LONG TERM GOAL #5   Title Patient will improve RUE shoulder strength to >4/5 for improved strength with lifting and ADLs to return to PLOF.    Time 8   Period Weeks   Status New               Plan - 12/01/15 1012    Clinical Impression Statement Patient reports pain has decreased since last session with taping, but continues to report difficulty sleeping due to pain. PT again applied McConnel tape to RUE to improve glenohumeral joint positionging to decrease her impingement. Patient reports immediate decrease in pain. Patient requires min-mod VCs throughout exercises to perform with appropriate shoulder positioning to decrease pain and allow for improved alignment. Patient has increase in pain with sidelying shoulder flexion; other exercises effected pain only mildly.  Patient would continue to benefit from additional skilled PT intervention to reduce shoulder pain,  improve postural musculature, and return to PLOF.    Rehab Potential Good   Clinical Impairments Affecting Rehab Potential positive indicator: young in age, motivated, also has help at home; Negative indicator: chronic pain, smoker; Patient's clinical presentation is stable as her pain is localized to right shoulder and does not vary much;    PT Frequency Other (comment)  12 visits over 8 weeks   PT Duration 8 weeks   PT Treatment/Interventions ADLs/Self Care Home Management;Cryotherapy;Electrical Stimulation;Iontophoresis 4mg /ml Dexamethasone;Moist Heat;Therapeutic exercise;Therapeutic activities;Functional mobility training;Ultrasound;Neuromuscular re-education;Patient/family education;Manual techniques;Taping;Energy conservation;Dry needling;Passive range of motion   PT Next Visit Plan postural strengthening, RC ER sidelying, rows, scapular depression, scap rhythmic stabilization  PT Home Exercise Plan maintained, see patient instructions;    Consulted and Agree with Plan of Care Patient      Patient will benefit from skilled therapeutic intervention in order to improve the following deficits and impairments:  Hypomobility, Decreased activity tolerance, Decreased strength, Pain, Impaired UE functional use, Decreased range of motion, Improper body mechanics, Postural dysfunction, Impaired flexibility  Visit Diagnosis: Pain in right shoulder  Stiffness of right shoulder, not elsewhere classified  Abnormal posture     Problem List Patient Active Problem List   Diagnosis Date Noted  . Vitamin B12 deficiency anemia 12/01/2014  . Blood glucose elevated 12/01/2014  . Avitaminosis D 12/01/2014  . GU infection, trichomonal 12/01/2014  . Irregular bleeding 12/01/2014  . Anemia, iron deficiency 12/01/2014  . Essential (primary) hypertension 05/03/2009  . 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 Depression, neurotic 02/03/2009   Tilman Neat, SPT This entire session  was performed under direct supervision and direction of a licensed therapist/therapist assistant . I have personally read, edited and approve of the note as written.  Trotter,Margaret PT, DPT 12/01/2015, 11:12 AM  [place] MAIN [facility] SERVICES [address] [place], [place], [address] Phone: [phone]   Fax:  [phone]  Name: Anna Bates MRN: [id number] Date of Birth: 1965/09/14

## 2015-12-04 ENCOUNTER — Encounter: Payer: Self-pay | Admitting: Family Medicine

## 2015-12-04 ENCOUNTER — Ambulatory Visit (INDEPENDENT_AMBULATORY_CARE_PROVIDER_SITE_OTHER): Payer: 59 | Admitting: Family Medicine

## 2015-12-04 VITALS — BP 128/88 | HR 68 | Temp 99.4°F | Resp 16 | Ht 68.0 in | Wt 245.0 lb

## 2015-12-04 DIAGNOSIS — I10 Essential (primary) hypertension: Secondary | ICD-10-CM | POA: Diagnosis not present

## 2015-12-04 DIAGNOSIS — D519 Vitamin B12 deficiency anemia, unspecified: Secondary | ICD-10-CM

## 2015-12-04 DIAGNOSIS — Z0000 Encounter for general adult medical examination without abnormal findings: Secondary | ICD-10-CM | POA: Diagnosis not present

## 2015-12-04 DIAGNOSIS — Z1211 Encounter for screening for malignant neoplasm of colon: Secondary | ICD-10-CM | POA: Diagnosis not present

## 2015-12-04 DIAGNOSIS — D509 Iron deficiency anemia, unspecified: Secondary | ICD-10-CM | POA: Diagnosis not present

## 2015-12-04 DIAGNOSIS — R739 Hyperglycemia, unspecified: Secondary | ICD-10-CM

## 2015-12-04 DIAGNOSIS — N644 Mastodynia: Secondary | ICD-10-CM | POA: Diagnosis not present

## 2015-12-04 DIAGNOSIS — E559 Vitamin D deficiency, unspecified: Secondary | ICD-10-CM

## 2015-12-04 NOTE — Progress Notes (Signed)
Patient: Anna Bates, Female    DOB: 1965/10/05, 50 y.o.   MRN: [id number] Visit Date: 12/04/2015  Today's Provider: Margarita Rana, MD   Chief Complaint  Patient presents with  . Annual Exam   Subjective:    Annual physical exam Anna Bates is a 50 y.o. female who presents today for health maintenance and complete physical. She feels well.  Pt reports having some left breast pain a few days before her periods starts and then the pain resolves.  She says this just started about 3 months ago.   She reports not exercising regularly.  She reports she is sleeping fairly well.  -----------------------------------------------------------------   Review of Systems  Constitutional: Negative.   HENT: Negative.   Eyes: Negative.   Respiratory: Negative.   Cardiovascular: Negative.   Gastrointestinal: Negative.   Endocrine: Negative.   Genitourinary: Negative.   Musculoskeletal: Positive for myalgias (Shoulder pain; secondary to surgery). Negative for back pain, joint swelling, arthralgias, gait problem, neck pain and neck stiffness.  Skin: Negative.   Allergic/Immunologic: Negative.   Neurological: Negative.   Hematological: Negative.   Psychiatric/Behavioral: Negative.     Social History      She  reports that she has been smoking.  She does not have any smokeless tobacco history on file. She reports that she does not drink alcohol or use illicit drugs.       Social History   Social History  . Marital Status: Single    Spouse Name: N/A  . Number of Children: N/A  . Years of Education: N/A   Social History Main Topics  . Smoking status: Current Every Day Smoker -- 0.50 packs/day for 30 years  . Smokeless tobacco: None     Comment: 3-4 cigarettes per day;   . Alcohol Use: No  . Drug Use: No  . Sexual Activity: Not Asked   Other Topics Concern  . None   Social History Narrative    No past medical history on file.   Patient Active Problem  List   Diagnosis Date Noted  . Vitamin B12 deficiency anemia 12/01/2014  . Blood glucose elevated 12/01/2014  . Avitaminosis D 12/01/2014  . GU infection, trichomonal 12/01/2014  . Irregular bleeding 12/01/2014  . Anemia, iron deficiency 12/01/2014  . Essential (primary) hypertension 05/03/2009  . 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 Depression, neurotic 02/03/2009    Past Surgical History  Procedure Laterality Date  . Shoulder surgery Right 05/09/2009  . Breast surgery  2002    Breast reduction -both  . Cholecystectomy  1991  . Cesarean section  1990  . Hernia repair  1980  . Right shoulder surgery Right 11/07/15    shoulder scope;     Family History        Family Status  Relation Status Death Age  . Other Alive   . Mother Deceased   . Father Deceased         Her family history includes Diabetes in her other; Hypertension in her other.    No Known Allergies  Current Meds  Medication Sig  . acetaminophen (TYLENOL) 500 MG tablet Take 500 mg by mouth every 6 (six) hours as needed.  . diazepam (VALIUM) 5 MG tablet Take 5 mg by mouth every 6 (six) hours as needed for anxiety (1/2- 1 tab).  Marland Kitchen oxyCODONE-acetaminophen (PERCOCET/ROXICET) 5-325 MG tablet Take by mouth every 4 (four) hours as needed for severe pain.  Patient Care Team: Margarita Rana, MD as PCP - General (Family Medicine)     Objective:   Vitals: BP 128/88 mmHg  Pulse 68  Temp(Src) 99.4 F (37.4 C) (Oral)  Resp 16  Ht 5\' 8"  (1.727 m)  Wt 245 lb (111.131 kg)  BMI 37.26 kg/m2   Physical Exam  Constitutional: She is oriented to person, place, and time. She appears well-developed and well-nourished.  HENT:  Head: Normocephalic and atraumatic.  Right Ear: External ear normal.  Left Ear: External ear normal.  Nose: Nose normal.  Mouth/Throat: Oropharynx is clear and moist.  Eyes: Conjunctivae and EOM are normal. Pupils are equal, round, and reactive to light.  Neck: Normal  range of motion. Neck supple. Carotid bruit is not present.  Cardiovascular: Normal rate, regular rhythm and normal heart sounds.   Pulmonary/Chest: Effort normal and breath sounds normal. Right breast exhibits no inverted nipple, no mass, no nipple discharge, no skin change and no tenderness. Left breast exhibits tenderness. Left breast exhibits no inverted nipple, no mass, no nipple discharge and no skin change. Breasts are symmetrical.  Fullness and tenderness 4 O'clock position left breast.    Musculoskeletal: Normal range of motion.  Neurological: She is alert and oriented to person, place, and time.  Skin: Skin is warm and dry.  Psychiatric: She has a normal mood and affect. Her behavior is normal. Judgment and thought content normal.     Depression Screen No flowsheet data found.    Assessment & Plan:     Routine Health Maintenance and Physical Exam  Exercise Activities and Dietary recommendations Goals    None      Immunization History  Administered Date(s) Administered  . Td 11/30/2004    Health Maintenance  Topic Date Due  . HIV Screening  07/22/1980  . PAP SMEAR  07/22/1986  . TETANUS/TDAP  12/01/2014  . MAMMOGRAM  07/23/2015  . COLONOSCOPY  07/23/2015  . INFLUENZA VACCINE  01/09/2016      Discussed health benefits of physical activity, and encouraged her to engage in regular exercise appropriate for her age and condition.     2. Vitamin B12 deficiency anemia - Vitamin B12  3. Avitaminosis D - VITAMIN D 25 Hydroxy (Vit-D Deficiency, Fractures)  4. Anemia, iron deficiency - CBC with Differential/Platelet  5. Essential (primary) hypertension Condition is stable. Please continue current medication and  plan of care as noted.   - Comprehensive metabolic panel - Lipid panel  6. Blood glucose elevated Recheck labs.   - Comprehensive metabolic panel - Hemoglobin A1c - Lipid panel - TSH  7. Colon cancer screening - Ambulatory referral to  Gastroenterology  8. Breast pain, left Will schedule diagnostic mammogram.  Follow up with Dr. Caryn Section.   - MM Digital Diagnostic Bilat - US BREAST LTD UNI LEFT INC AXILLA - US BREAST LTD UNI RIGHT INC AXILLA   Patient was seen and examined by Jerrell Belfast, MD, and note scribed by Ashley Royalty, CMA.  I have reviewed the document for accuracy and completeness and I agree with above. - Jerrell Belfast, MD   Margarita Rana, MD  [facility]

## 2015-12-05 ENCOUNTER — Encounter: Payer: PRIVATE HEALTH INSURANCE | Admitting: Physical Therapy

## 2015-12-05 ENCOUNTER — Telehealth: Payer: Self-pay

## 2015-12-05 ENCOUNTER — Other Ambulatory Visit: Payer: Self-pay

## 2015-12-05 DIAGNOSIS — R739 Hyperglycemia, unspecified: Secondary | ICD-10-CM | POA: Diagnosis not present

## 2015-12-05 DIAGNOSIS — E559 Vitamin D deficiency, unspecified: Secondary | ICD-10-CM | POA: Diagnosis not present

## 2015-12-05 DIAGNOSIS — D519 Vitamin B12 deficiency anemia, unspecified: Secondary | ICD-10-CM | POA: Diagnosis not present

## 2015-12-05 DIAGNOSIS — I10 Essential (primary) hypertension: Secondary | ICD-10-CM | POA: Diagnosis not present

## 2015-12-05 DIAGNOSIS — D509 Iron deficiency anemia, unspecified: Secondary | ICD-10-CM | POA: Diagnosis not present

## 2015-12-05 NOTE — Telephone Encounter (Signed)
Gastroenterology Pre-Procedure Review  Request Date: 12/21/15 Requesting Physician: Dr. Venia Minks  PATIENT REVIEW QUESTIONS: The patient responded to the following health history questions as indicated:    1. Are you having any GI issues? no 2. Do you have a personal history of Polyps? no 3. Do you have a family history of Colon Cancer or Polyps? no 4. Diabetes Mellitus? no 5. Joint replacements in the past 12 months?no 6. Major health problems in the past 3 months?no 7. Any artificial heart valves, MVP, or defibrillator?no    MEDICATIONS & ALLERGIES:    Patient reports the following regarding taking any anticoagulation/antiplatelet therapy:   Plavix, Coumadin, Eliquis, Xarelto, Lovenox, Pradaxa, Brilinta, or Effient? no Aspirin? no  Patient confirms/reports the following medications:  Current Outpatient Prescriptions  Medication Sig Dispense Refill  . acetaminophen (TYLENOL) 500 MG tablet Take 500 mg by mouth every 6 (six) hours as needed.    . diazepam (VALIUM) 5 MG tablet Take 5 mg by mouth every 6 (six) hours as needed for anxiety (1/2- 1 tab).    Marland Kitchen oxyCODONE-acetaminophen (PERCOCET/ROXICET) 5-325 MG tablet Take by mouth every 4 (four) hours as needed for severe pain.     No current facility-administered medications for this visit.    Patient confirms/reports the following allergies:  No Known Allergies  No orders of the defined types were placed in this encounter.    AUTHORIZATION INFORMATION Primary Insurance: 1D#: Group #:  Secondary Insurance: 1D#: Group #:  SCHEDULE INFORMATION: Date: 12/21/15 Time: Location: [place]

## 2015-12-06 ENCOUNTER — Telehealth: Payer: Self-pay

## 2015-12-06 ENCOUNTER — Ambulatory Visit: Payer: PRIVATE HEALTH INSURANCE | Admitting: Physical Therapy

## 2015-12-06 ENCOUNTER — Encounter: Payer: Self-pay | Admitting: Physical Therapy

## 2015-12-06 DIAGNOSIS — M25511 Pain in right shoulder: Secondary | ICD-10-CM

## 2015-12-06 DIAGNOSIS — M6281 Muscle weakness (generalized): Secondary | ICD-10-CM

## 2015-12-06 DIAGNOSIS — D75839 Thrombocytosis, unspecified: Secondary | ICD-10-CM

## 2015-12-06 DIAGNOSIS — M25611 Stiffness of right shoulder, not elsewhere classified: Secondary | ICD-10-CM

## 2015-12-06 DIAGNOSIS — R293 Abnormal posture: Secondary | ICD-10-CM

## 2015-12-06 DIAGNOSIS — D72829 Elevated white blood cell count, unspecified: Secondary | ICD-10-CM

## 2015-12-06 DIAGNOSIS — D509 Iron deficiency anemia, unspecified: Secondary | ICD-10-CM

## 2015-12-06 DIAGNOSIS — E559 Vitamin D deficiency, unspecified: Secondary | ICD-10-CM

## 2015-12-06 DIAGNOSIS — D473 Essential (hemorrhagic) thrombocythemia: Secondary | ICD-10-CM | POA: Insufficient documentation

## 2015-12-06 LAB — CBC WITH DIFFERENTIAL/PLATELET
BASOS ABS: 0.1 10*3/uL (ref 0.0–0.2)
Basos: 1 %
EOS (ABSOLUTE): 0.8 10*3/uL — ABNORMAL HIGH (ref 0.0–0.4)
Eos: 6 %
Hematocrit: 35 % (ref 34.0–46.6)
Hemoglobin: 10.8 g/dL — ABNORMAL LOW (ref 11.1–15.9)
IMMATURE GRANS (ABS): 0 10*3/uL (ref 0.0–0.1)
Immature Granulocytes: 0 %
LYMPHS ABS: 4.1 10*3/uL — AB (ref 0.7–3.1)
Lymphs: 31 %
MCH: 24.1 pg — ABNORMAL LOW (ref 26.6–33.0)
MCHC: 30.9 g/dL — ABNORMAL LOW (ref 31.5–35.7)
MCV: 78 fL — AB (ref 79–97)
MONOS ABS: 1 10*3/uL — AB (ref 0.1–0.9)
Monocytes: 8 %
Neutrophils Absolute: 7.4 10*3/uL — ABNORMAL HIGH (ref 1.4–7.0)
Neutrophils: 54 %
PLATELETS: 985 10*3/uL — AB (ref 150–379)
RBC: 4.49 x10E6/uL (ref 3.77–5.28)
RDW: 18.8 % — AB (ref 12.3–15.4)
WBC: 13.5 10*3/uL — AB (ref 3.4–10.8)

## 2015-12-06 LAB — COMPREHENSIVE METABOLIC PANEL
ALT: 9 IU/L (ref 0–32)
AST: 17 IU/L (ref 0–40)
Albumin/Globulin Ratio: 1.4 (ref 1.2–2.2)
Albumin: 4.2 g/dL (ref 3.5–5.5)
Alkaline Phosphatase: 52 IU/L (ref 39–117)
BUN/Creatinine Ratio: 9 (ref 9–23)
BUN: 6 mg/dL (ref 6–24)
Bilirubin Total: 0.2 mg/dL (ref 0.0–1.2)
CALCIUM: 9.8 mg/dL (ref 8.7–10.2)
CO2: 20 mmol/L (ref 18–29)
CREATININE: 0.64 mg/dL (ref 0.57–1.00)
Chloride: 102 mmol/L (ref 96–106)
GFR, EST AFRICAN AMERICAN: 120 mL/min/{1.73_m2} (ref >59)
GFR, EST NON AFRICAN AMERICAN: 104 mL/min/{1.73_m2} (ref >59)
Globulin, Total: 2.9 g/dL (ref 1.5–4.5)
Glucose: 74 mg/dL (ref 65–99)
Potassium: 4.9 mmol/L (ref 3.5–5.2)
Sodium: 140 mmol/L (ref 134–144)
TOTAL PROTEIN: 7.1 g/dL (ref 6.0–8.5)

## 2015-12-06 LAB — HEMOGLOBIN A1C
ESTIMATED AVERAGE GLUCOSE: 120 mg/dL
Hgb A1c MFr Bld: 5.8 % — ABNORMAL HIGH (ref 4.8–5.6)

## 2015-12-06 LAB — LIPID PANEL
CHOLESTEROL TOTAL: 148 mg/dL (ref 100–199)
Chol/HDL Ratio: 2.9 ratio units (ref 0.0–4.4)
HDL: 51 mg/dL (ref >39)
LDL CALC: 72 mg/dL (ref 0–99)
TRIGLYCERIDES: 124 mg/dL (ref 0–149)
VLDL CHOLESTEROL CAL: 25 mg/dL (ref 5–40)

## 2015-12-06 LAB — VITAMIN B12: VITAMIN B 12: 159 pg/mL — AB (ref 211–946)

## 2015-12-06 LAB — TSH: TSH: 2.4 u[IU]/mL (ref 0.450–4.500)

## 2015-12-06 LAB — VITAMIN D 25 HYDROXY (VIT D DEFICIENCY, FRACTURES): Vit D, 25-Hydroxy: 7.8 ng/mL — ABNORMAL LOW (ref 30.0–100.0)

## 2015-12-06 MED ORDER — VITAMIN D (ERGOCALCIFEROL) 1.25 MG (50000 UNIT) PO CAPS: 50000.0000 [IU] | ORAL_CAPSULE | ORAL | Status: AC

## 2015-12-06 NOTE — Telephone Encounter (Signed)
Pt advised.  She reports she has not been to hematology in years.  Will refer.  RX sent to [facility] [address] [address].  Thanks,   -Mickel Baas

## 2015-12-06 NOTE — Therapy (Signed)
[place] MAIN [facility] SERVICES [address] [place], [place], [address] Phone: [phone]   Fax:  [phone]  Physical Therapy Treatment  Patient Details  Name: Anna Bates MRN: [id number] Date of Birth: Oct 30, 1965 Referring Provider: Dr. Onnie Graham  Encounter Date: 12/06/2015      PT End of Session - 12/06/15 1149    Visit Number 9   Number of Visits 12   Date for PT Re-Evaluation 01/09/16   Authorization Type worker's comp approved 12 visits   PT Start Time 1105   PT Stop Time 1146   PT Time Calculation (min) 41 min   Activity Tolerance Patient limited by pain   Behavior During Therapy [facility] for tasks assessed/performed      History reviewed. No pertinent past medical history.  Past Surgical History  Procedure Laterality Date  . Shoulder surgery Right 05/09/2009  . Breast surgery  2002    Breast reduction -both  . Cholecystectomy  1991  . Cesarean section  1990  . Hernia repair  1980  . Right shoulder surgery Right 11/07/15    shoulder scope;     There were no vitals filed for this visit.      Subjective Assessment - 12/06/15 1147    Subjective Pt reports that her shoulder has been feeling good, she also reports that the taping has really made a difference in her pain level.     Pertinent History Personal factors affecting rehab: every day smoker; chronic shoulder pain with multiple injuries;    How long can you sit comfortably? sleeping in a recliner;    How long can you stand comfortably? NA   How long can you walk comfortably? NA   Diagnostic tests no recent scans; goes back to see MD next Tuesday June 13th   Patient Stated Goals "To be able to have full shoulder ROM of RUE; be able to go back to work without limitations;  be able to swim, hang out with family;    Currently in Pain? Yes   Pain Score 3    Pain Location Shoulder   Pain Orientation Right   Pain Descriptors / Indicators Stabbing   Pain Type Chronic  pain;Surgical pain   Pain Onset More than a month ago      Treatment  McConnel taping performed to R shoulder prior to exercise to place shoulder in a position of scapular retraction and shoulder ER, pt able to maintain full shoulder flexion/scaption ROM with tape  Pushups against therapy ball standing, 2 sets x 10 reps, pt able to maintain stability and no complaints of increased pain  AAROM shoulder flexion against wall with therapy ball 1 set x 10 reps, min VCs for initial positioning  Shoulder extension with red theraband resistance 1 set x 10 reps RUE, min VCs for greater extension ROM  Scapular depression with red theraband resistance 2 sets x 10 reps, min VCs to increase scapular depression ROM and maintain throughout range Mid thoracic retractions with red theraband resistance 2 sets x 10 reps, min tactile cues to promote greater scapular retraction  Low thoracic retractions with red theraband, 2 sets x 10 reps, min VCs to maintain elbows by side  Shoulder ER with red theraband resistance, 2 sets x 10 reps, min VCs to decrease trunk rotation  Resisted shoulder ER V's up and down wall with yellow theraband , 1 set x 10 reps, min VCs to maintain shoulder ER and eccentric control lowering  Chest press with  overhead extension BUE using 4lb wand, 2 sets x 10 reps, min VCs to keep elbows in extension  Serratus punches, 2 sets x 10 reps in supine with 2lb weight, min VCs to increase scapular protraction  Rhythmic stabilizatioin supine, 3 sets x 30 seconds in position of 90 degrees shoulder flexion and some shoulder abduction, no complaints of pain or fatigue                            PT Education - 12/06/15 1148    Education Details posture, continueing to ice, avoiding IR    Person(s) Educated Patient   Methods Explanation;Demonstration;Verbal cues   Comprehension Verbalized understanding;Returned demonstration             PT Long Term Goals - 11/14/15 1150     PT LONG TERM GOAL #1   Title Patient will be independent in home exercise program to improve strength/mobility for better functional independence with ADLs.    Time 8   Period Weeks   Status New   PT LONG TERM GOAL #2   Title  Patient will decrease Quick DASH score by > 8 points demonstrating reduced self-reported upper extremity disability.   Time 8   Period Weeks   Status New   PT LONG TERM GOAL #3   Title Patient will improve shoulder AROM to > 150 degrees of flexion, scaption, and abduction for improved ability to perform overhead activities.   Time 8   Period Weeks   Status New   PT LONG TERM GOAL #4   Title Patient will report a worst pain of 3/10 on VAS in    right shoulder         to improve tolerance with ADLs and reduced symptoms with activities.    Time 8   Period Weeks   Status New   PT LONG TERM GOAL #5   Title Patient will improve RUE shoulder strength to >4/5 for improved strength with lifting and ADLs to return to PLOF.    Time 8   Period Weeks   Status New               Plan - 12/06/15 1149    Clinical Impression Statement Pt reports that her shoulder has been feeling better and that her pain is only a 3/10 in R shoulder.  She reports that the McConnel tape has been helping with pain control.  Pt was able to perform scapular retraction exercises with proper technique with minimal verbal cues.  Pt was able to perform pushups against the therapy ball without loss of balance or increase in pain.  Pt was able to perform supine chest press with proper technique and overhead press with 4lb weight and no complaint of pain.  She reports that the sidelying ER HEP is the most difficult due to increased pain.  Pt would continue to benefit from further skilled PT to increase RC and scapular strength to promote greater R shoulder stability to perform work duties.     Rehab Potential Good   Clinical Impairments Affecting Rehab Potential positive indicator: young in age,  motivated, also has help at home; Negative indicator: chronic pain, smoker; Patient's clinical presentation is stable as her pain is localized to right shoulder and does not vary much;    PT Frequency Other (comment)  12 visits over 8 weeks   PT Duration 8 weeks   PT Treatment/Interventions ADLs/Self Care Home Management;Cryotherapy;Electrical Stimulation;Iontophoresis 4mg /ml  Dexamethasone;Moist Heat;Therapeutic exercise;Therapeutic activities;Functional mobility training;Ultrasound;Neuromuscular re-education;Patient/family education;Manual techniques;Taping;Energy conservation;Dry needling;Passive range of motion   PT Next Visit Plan postural strengthening, RC ER sidelying, rows, scapular depression, scap rhythmic stabilization    PT Home Exercise Plan maintained, see patient instructions;    Consulted and Agree with Plan of Care Patient      Patient will benefit from skilled therapeutic intervention in order to improve the following deficits and impairments:  Hypomobility, Decreased activity tolerance, Decreased strength, Pain, Impaired UE functional use, Decreased range of motion, Improper body mechanics, Postural dysfunction, Impaired flexibility  Visit Diagnosis: Pain in right shoulder  Stiffness of right shoulder, not elsewhere classified  Abnormal posture  Muscle weakness (generalized)     Problem List Patient Active Problem List   Diagnosis Date Noted  . Thrombocytosis ([place]) 12/06/2015  . Leukocytosis 12/06/2015  . Vitamin B12 deficiency anemia 12/01/2014  . Blood glucose elevated 12/01/2014  . Avitaminosis D 12/01/2014  . GU infection, trichomonal 12/01/2014  . Irregular bleeding 12/01/2014  . Anemia, iron deficiency 12/01/2014  . Essential (primary) hypertension 05/03/2009  . Depression, neurotic 02/03/2009   Stacy Gardner, SPT  This entire session was performed under direct supervision and direction of a licensed therapist/therapist assistant . I have personally  read, edited and approve of the note as written.  Trotter,Margaret PT, DPT 12/06/2015, 1:16 PM  [place] MAIN [facility] SERVICES [address] [place], [place], [address] Phone: [phone]   Fax:  [phone]  Name: Anna Bates MRN: [id number] Date of Birth: [birth year]

## 2015-12-06 NOTE — Telephone Encounter (Signed)
-----   Message from Margarita Rana, MD sent at 12/06/2015 10:20 AM EDT ----- WBC count, red cell count and platelets all still off. Please make sure patient follow up with hematology,   HgbA1c is upper limits of normal,  B12 is low, needs to take 2000 mcg daily and recheck in 3 months,  Also very low Vit D, can send in 50,000 IU daily. Thanks.

## 2015-12-07 ENCOUNTER — Ambulatory Visit: Payer: PRIVATE HEALTH INSURANCE | Admitting: Physical Therapy

## 2015-12-07 ENCOUNTER — Encounter: Payer: Self-pay | Admitting: Physical Therapy

## 2015-12-07 DIAGNOSIS — M6281 Muscle weakness (generalized): Secondary | ICD-10-CM

## 2015-12-07 DIAGNOSIS — M25511 Pain in right shoulder: Secondary | ICD-10-CM | POA: Diagnosis not present

## 2015-12-07 DIAGNOSIS — R293 Abnormal posture: Secondary | ICD-10-CM

## 2015-12-07 DIAGNOSIS — M25611 Stiffness of right shoulder, not elsewhere classified: Secondary | ICD-10-CM

## 2015-12-07 NOTE — Therapy (Signed)
[place] MAIN [facility] SERVICES [place], [place], [address] Phone: [phone]   Fax:  [phone]  Physical Therapy Treatment  Patient Details  Name: Anna Bates MRN: [id number] Date of Birth: Jan 13, 1966 Referring Provider: Dr. Onnie Graham  Encounter Date: 12/07/2015      PT End of Session - 12/07/15 1440    Visit Number 10   Number of Visits 12   Date for PT Re-Evaluation 01/09/16   Authorization Type worker's comp approved 12 visits   PT Start Time [id number]   PT Stop Time 1431   PT Time Calculation (min) 44 min   Activity Tolerance Patient limited by pain   Behavior During Therapy [facility] for tasks assessed/performed      History reviewed. No pertinent past medical history.  Past Surgical History  Procedure Laterality Date  . Shoulder surgery Right 05/09/2009  . Breast surgery  2002    Breast reduction -both  . Cholecystectomy  1991  . Cesarean section  1990  . Hernia repair  1980  . Right shoulder surgery Right 11/07/15    shoulder scope;     There were no vitals filed for this visit.      Subjective Assessment - 12/07/15 1354    Subjective Pt reports some soreness today after yesterdays session with a pain level of 1/10.     Pertinent History Personal factors affecting rehab: every day smoker; chronic shoulder pain with multiple injuries;    How long can you sit comfortably? sleeping in a recliner;    How long can you stand comfortably? NA   How long can you walk comfortably? NA   Diagnostic tests no recent scans; goes back to see MD next Tuesday June 13th   Patient Stated Goals "To be able to have full shoulder ROM of RUE; be able to go back to work without limitations;  be able to swim, hang out with family;    Currently in Pain? Yes   Pain Score 1    Pain Location Shoulder   Pain Orientation Right   Pain Descriptors / Indicators Stabbing   Pain Type Surgical pain;Chronic pain   Pain Onset More than a month  ago      Treatment   Supine ball squeezes using therapy ball with arms extended over chest, 2 sets x 10 reps, min VCs for initial shoulder placement   Overhead shoulder flexion with therapy ball x 10 reps, 10 reps in diagonal pattern to the L and 10 reps in diagonal pattern to the R, min VCs to stop ROM before pain   Quadrapud position with lateral UE rocks x 20 reps, anterior/posterior UE rocks x 20 reps, no complaints of pain   Serratus push ups in quadruped deferred due to pain   Seated D2 AROM x 10 reps, demonstration provided and min VCs to increase eccentric control   PT taped R shoulder using McConnel taping in scapular retraction and ER for pain relief   Seated D1 AROM x 10 reps, demonstration provided and min VCs to increase elbow extension   Mid rows with red theraband resistance, 2 sets x 10 reps, min VCs to increase scapular retraction   Low rows with red theraband resistance, 2  Sets x 10 reps, min VCs to maintain elbow extension   Reinforced HEP and instructed pt to perform less HEP on therapy days  PT Education - 12/07/15 1439    Education provided Yes   Education Details posture, decreasing HEP on therapy days, icing for comfort    Person(s) Educated Patient   Methods Explanation;Demonstration;Verbal cues   Comprehension Verbalized understanding;Returned demonstration             PT Long Term Goals - 11/14/15 1150    PT LONG TERM GOAL #1   Title Patient will be independent in home exercise program to improve strength/mobility for better functional independence with ADLs.    Time 8   Period Weeks   Status New   PT LONG TERM GOAL #2   Title  Patient will decrease Quick DASH score by > 8 points demonstrating reduced self-reported upper extremity disability.   Time 8   Period Weeks   Status New   PT LONG TERM GOAL #3   Title Patient will improve shoulder AROM to > 150 degrees of flexion, scaption, and  abduction for improved ability to perform overhead activities.   Time 8   Period Weeks   Status New   PT LONG TERM GOAL #4   Title Patient will report a worst pain of 3/10 on VAS in    right shoulder         to improve tolerance with ADLs and reduced symptoms with activities.    Time 8   Period Weeks   Status New   PT LONG TERM GOAL #5   Title Patient will improve RUE shoulder strength to >4/5 for improved strength with lifting and ADLs to return to PLOF.    Time 8   Period Weeks   Status New               Plan - 12/07/15 1441    Clinical Impression Statement Pt presented with some muscle sorness after yesterdays session and 1/10 stabbing pain in R shoudler.  Attempted to progress shoulder stability by working in quadruped, pt was able to tolerate lateral rocking on UEs and anterior/posterior rocking without pain but unable to perform serratus push ups in quadruped due to pain.  PT retapped pts R shoulder into scapular retraction and external rotation for greater stability and pain relief with McConnel tape.  Continued therapy with resisted scapular retractions and shoulder extension in order to keep pain decreased.  Pt would benefit from further skilled physical therapy to increase RC and scapular strength so that she is able to perform all neccesary duties to return to work.     Rehab Potential Good   Clinical Impairments Affecting Rehab Potential positive indicator: young in age, motivated, also has help at home; Negative indicator: chronic pain, smoker; Patient's clinical presentation is stable as her pain is localized to right shoulder and does not vary much;    PT Frequency Other (comment)  12 visits over 8 weeks   PT Duration 8 weeks   PT Treatment/Interventions ADLs/Self Care Home Management;Cryotherapy;Electrical Stimulation;Iontophoresis 4mg /ml Dexamethasone;Moist Heat;Therapeutic exercise;Therapeutic activities;Functional mobility training;Ultrasound;Neuromuscular  re-education;Patient/family education;Manual techniques;Taping;Energy conservation;Dry needling;Passive range of motion   PT Next Visit Plan postural strengthening, RC ER sidelying, rows, scapular depression, scap rhythmic stabilization    PT Home Exercise Plan maintained, see patient instructions;    Consulted and Agree with Plan of Care Patient      Patient will benefit from skilled therapeutic intervention in order to improve the following deficits and impairments:  Hypomobility, Decreased activity tolerance, Decreased strength, Pain, Impaired UE functional use, Decreased range of motion, Improper body mechanics, Postural dysfunction, Impaired flexibility  Visit Diagnosis: Pain in right shoulder  Stiffness of right shoulder, not elsewhere classified  Abnormal posture  Muscle weakness (generalized)     Problem List Patient Active Problem List   Diagnosis Date Noted  . Thrombocytosis ([place]) 12/06/2015  . Leukocytosis 12/06/2015  . Vitamin B12 deficiency anemia 12/01/2014  . Blood glucose elevated 12/01/2014  . Avitaminosis D 12/01/2014  . GU infection, trichomonal 12/01/2014  . Irregular bleeding 12/01/2014  . Anemia, iron deficiency 12/01/2014  . Essential (primary) hypertension 05/03/2009  . Depression, neurotic 02/03/2009   Stacy Gardner, SPT  This entire session was performed under direct supervision and direction of a licensed therapist/therapist assistant . I have personally read, edited and approve of the note as written.  Trotter,Margaret PT, DPT 12/07/2015, 3:43 PM  [place] MAIN [facility] SERVICES [address] [place], [place], [address] Phone: [phone]   Fax:  [phone]  Name: Anna Bates MRN: [id number] Date of Birth: 1965/11/04

## 2015-12-13 ENCOUNTER — Ambulatory Visit: Payer: PRIVATE HEALTH INSURANCE | Admitting: Physical Therapy

## 2015-12-14 ENCOUNTER — Encounter: Payer: Self-pay | Admitting: Physical Therapy

## 2015-12-14 ENCOUNTER — Encounter: Payer: Self-pay | Admitting: *Deleted

## 2015-12-14 ENCOUNTER — Ambulatory Visit: Payer: PRIVATE HEALTH INSURANCE | Attending: Orthopedic Surgery | Admitting: Physical Therapy

## 2015-12-14 DIAGNOSIS — R293 Abnormal posture: Secondary | ICD-10-CM | POA: Insufficient documentation

## 2015-12-14 DIAGNOSIS — M25611 Stiffness of right shoulder, not elsewhere classified: Secondary | ICD-10-CM | POA: Insufficient documentation

## 2015-12-14 DIAGNOSIS — M25511 Pain in right shoulder: Secondary | ICD-10-CM | POA: Insufficient documentation

## 2015-12-14 DIAGNOSIS — M6281 Muscle weakness (generalized): Secondary | ICD-10-CM | POA: Insufficient documentation

## 2015-12-14 NOTE — Therapy (Signed)
[place] [facility] SERVICES [place], [place], [address] Phone: [phone]   Fax:  [phone]  Physical Therapy Treatment/Progress Note  Patient Details  Name: Anna Bates MRN: [id number] Date of Birth: Jun 07, 1966 Referring Provider: Dr. Onnie Graham  Encounter Date: 12/14/2015      PT End of Session - 12/14/15 1715    Visit Number 11   Number of Visits 12   Date for PT Re-Evaluation 01/09/16   Authorization Type worker's comp approved 12 visits   PT Start Time 1435   PT Stop Time 1515   PT Time Calculation (min) 40 min   Activity Tolerance Patient tolerated treatment well;No increased pain   Behavior During Therapy [facility] for tasks assessed/performed      Past Medical History  Diagnosis Date  . Anemia     Past Surgical History  Procedure Laterality Date  . Shoulder surgery Right 05/09/2009  . Breast surgery  2002    Breast reduction -both  . Cholecystectomy  1991  . Cesarean section  1990  . Hernia repair  1980  . Right shoulder surgery Right 11/07/15    shoulder scope;     There were no vitals filed for this visit.      Subjective Assessment - 12/14/15 1449    Subjective Pt reports that her shoulder was feeling okay until Tuesday when she took off the McConnel taping.  She continues to report that she is not sleeping well and that her pain is at an 8/10 during the night.     Pertinent History Personal factors affecting rehab: every day smoker; chronic shoulder pain with multiple injuries;    How long can you sit comfortably? sleeping in a recliner;    How long can you stand comfortably? NA   How long can you walk comfortably? NA   Diagnostic tests no recent scans; goes back to see MD next Tuesday June 13th   Patient Stated Goals "To be able to have full shoulder ROM of RUE; be able to go back to work without limitations;  be able to swim, hang out with family;    Currently in Pain? Yes   Pain Score 3     Pain Location Shoulder   Pain Orientation Right   Pain Descriptors / Indicators Stabbing   Pain Type Surgical pain;Chronic pain   Pain Onset More than a month ago           Treatment Remeasured ROM for progress  McConnel taping into shoulder retraction and ER for comfort, pt reports some pain relief after taping  External rotation with yellow theraband resistance, 2 sets x 10 reps, emphasis on scapular retraction for starting position  Internal rotation with yellow theraband resistance, 2 sets x 10 reps, emphasis on eccentric control, min VCs to keep elbow next to side  Scapular retraction with red theraband resistance, 2 sets x 10 reps, min VCs for starting position in retraction and ER  Low rows with red theraband resistance, 2 sets x 10 reps, min VCs for upright posture and increased ROM  Ball rolls up the wall for shoulder flexion, 1 set x 10 reps, min Vcs for eccentric control  Pushups against therapy ball on wall, 1 set x 10 reps, min VCs for posture   Educated pt extensively on modifications at home to reduce pain and still be able to perform shoulder motion with RUE.  Pt understood that modifications are not "set backs" but rather progressions to PLOF.  Educated pt on body mechanics to not lift heavy objects but reviewed with patient how she would lift and move a tray or pick something off a shelf with the assistance of her RUE.                         PT Education - 12/14/15 1715    Education provided Yes   Education Details posture, adaptations, revision to HEP    Person(s) Educated Patient   Methods Explanation;Demonstration;Verbal cues   Comprehension Verbalized understanding;Returned demonstration;Verbal cues required             PT Long Term Goals - 12/14/15 1725    PT LONG TERM GOAL #1   Title Patient will be independent in home exercise program to improve strength/mobility for better functional independence with ADLs.    Baseline 44%  disability 05/16/15   Time 8   Period Weeks   Status On-going   PT LONG TERM GOAL #2   Title  Patient will decrease Quick DASH score by > 8 points demonstrating reduced self-reported upper extremity disability.   Time 8   Period Weeks   Status New   PT LONG TERM GOAL #3   Title Patient will improve shoulder AROM to > 150 degrees of flexion, scaption, and abduction for improved ability to perform overhead activities.   Time 8   Period Weeks   Status Partially Met   PT LONG TERM GOAL #4   Title Patient will report a worst pain of 3/10 on VAS in    right shoulder         to improve tolerance with ADLs and reduced symptoms with activities.    Time 8   Period Weeks   Status On-going   PT LONG TERM GOAL #5   Title Patient will improve RUE shoulder strength to >4/5 for improved strength with lifting and ADLs to return to PLOF.    Time 8   Period Weeks   Status Partially Met               Plan - 12/14/15 1716    Clinical Impression Statement Pt continues to be frustrated with her pain level post op.  She reports that she is having as much pain as she did prior to surgery and that she is unable to sleep at night due to increased pain.  Pt understands that her shoulder cannot be taped into retraction long term but that it is painful without it.  She reports that her motion is better and that she was able to do laundry today but the repetitive motion of folding bothered her. Pt attempted to grab a gallon of tea from the  grocery store shelf with her RUE and was frustrated with the pain.  Reinstructed pt in body mechanics and to avoid lifting heavy with her arm.  Pts AROM has improved to 140-145 with flexion/abduction/scaption in seated position.  Her IR and ER AROM in supine remains functional and her shoulder strength has increased but is still limited by pain.  She has partially met her strength and AROM goals but is still limited by pain.  Pt reports that she believes her motion is  functional and her strength is not yet.  She would continue to benefit from skilled physical therapy to increase her shoulder strength, decrease pain, and work on body mechanics to perform all functional tasks required at work.      Rehab Potential Good  Clinical Impairments Affecting Rehab Potential positive indicator: young in age, motivated, also has help at home; Negative indicator: chronic pain, smoker; Patient's clinical presentation is stable as her pain is localized to right shoulder and does not vary much;    PT Frequency Other (comment)  12 visits over 8 weeks   PT Duration 8 weeks   PT Treatment/Interventions ADLs/Self Care Home Management;Cryotherapy;Electrical Stimulation;Iontophoresis 5m/ml Dexamethasone;Moist Heat;Therapeutic exercise;Therapeutic activities;Functional mobility training;Ultrasound;Neuromuscular re-education;Patient/family education;Manual techniques;Taping;Energy conservation;Dry needling;Passive range of motion   PT Next Visit Plan postural strengthening, RC ER sidelying, rows, scapular depression, scap rhythmic stabilization    PT Home Exercise Plan maintained, see patient instructions;    Consulted and Agree with Plan of Care Patient      Patient will benefit from skilled therapeutic intervention in order to improve the following deficits and impairments:  Hypomobility, Decreased activity tolerance, Decreased strength, Pain, Impaired UE functional use, Decreased range of motion, Improper body mechanics, Postural dysfunction, Impaired flexibility  Visit Diagnosis: Pain in right shoulder  Muscle weakness (generalized)  Abnormal posture  Stiffness of right shoulder, not elsewhere classified     Problem List Patient Active Problem List   Diagnosis Date Noted  . Thrombocytosis ([place] 12/06/2015  . Leukocytosis 12/06/2015  . Vitamin B12 deficiency anemia 12/01/2014  . Blood glucose elevated 12/01/2014  . Avitaminosis D 12/01/2014  . GU infection,  trichomonal 12/01/2014  . Irregular bleeding 12/01/2014  . Anemia, iron deficiency 12/01/2014  . Essential (primary) hypertension 05/03/2009  . Depression, neurotic 02/03/2009   TStacy Gardner SPT  This entire session was performed under direct supervision and direction of a licensed therapist/therapist assistant . I have personally read, edited and approve of the note as written.   Trotter,Margaret PT, DPT 12/15/2015, 8:40 AM  [place] [facility] [address] [place] [address]Phone: [phone]  Fax:  [phone] Name: Anna WILLISONMRN: [id number] of Birth: [birth year]

## 2015-12-15 ENCOUNTER — Ambulatory Visit
Admission: RE | Admit: 2015-12-15 | Discharge: 2015-12-15 | Disposition: A | Discharge status: Home / Self Care | Payer: 59 | Source: Ambulatory Visit | Attending: Family Medicine | Admitting: Family Medicine

## 2015-12-15 ENCOUNTER — Ambulatory Visit: Payer: PRIVATE HEALTH INSURANCE | Admitting: Physical Therapy

## 2015-12-15 DIAGNOSIS — N644 Mastodynia: Secondary | ICD-10-CM | POA: Insufficient documentation

## 2015-12-15 DIAGNOSIS — R928 Other abnormal and inconclusive findings on diagnostic imaging of breast: Secondary | ICD-10-CM | POA: Diagnosis not present

## 2015-12-19 ENCOUNTER — Ambulatory Visit: Payer: PRIVATE HEALTH INSURANCE | Admitting: Physical Therapy

## 2015-12-20 NOTE — Discharge Instructions (Signed)

## 2015-12-21 ENCOUNTER — Ambulatory Visit: Payer: 59 | Admitting: Anesthesiology

## 2015-12-21 ENCOUNTER — Encounter
Admission: RE | Disposition: A | Discharge status: Home / Self Care | Payer: Self-pay | Source: Ambulatory Visit | Attending: Gastroenterology

## 2015-12-21 ENCOUNTER — Ambulatory Visit: Payer: PRIVATE HEALTH INSURANCE | Admitting: Physical Therapy

## 2015-12-21 ENCOUNTER — Ambulatory Visit
Admission: RE | Admit: 2015-12-21 | Discharge: 2015-12-21 | Disposition: A | Discharge status: Home / Self Care | Payer: 59 | Source: Ambulatory Visit | Attending: Gastroenterology | Admitting: Gastroenterology

## 2015-12-21 DIAGNOSIS — Z9049 Acquired absence of other specified parts of digestive tract: Secondary | ICD-10-CM | POA: Insufficient documentation

## 2015-12-21 DIAGNOSIS — Z79899 Other long term (current) drug therapy: Secondary | ICD-10-CM | POA: Insufficient documentation

## 2015-12-21 DIAGNOSIS — Z833 Family history of diabetes mellitus: Secondary | ICD-10-CM | POA: Insufficient documentation

## 2015-12-21 DIAGNOSIS — K5730 Diverticulosis of large intestine without perforation or abscess without bleeding: Secondary | ICD-10-CM | POA: Diagnosis not present

## 2015-12-21 DIAGNOSIS — F17200 Nicotine dependence, unspecified, uncomplicated: Secondary | ICD-10-CM | POA: Insufficient documentation

## 2015-12-21 DIAGNOSIS — Z8249 Family history of ischemic heart disease and other diseases of the circulatory system: Secondary | ICD-10-CM | POA: Diagnosis not present

## 2015-12-21 DIAGNOSIS — K641 Second degree hemorrhoids: Secondary | ICD-10-CM | POA: Insufficient documentation

## 2015-12-21 DIAGNOSIS — Z1211 Encounter for screening for malignant neoplasm of colon: Secondary | ICD-10-CM | POA: Insufficient documentation

## 2015-12-21 HISTORY — DX: Anemia, unspecified: D64.9

## 2015-12-21 HISTORY — PX: COLONOSCOPY WITH PROPOFOL: SHX5780

## 2015-12-21 SURGERY — COLONOSCOPY WITH PROPOFOL
Anesthesia: Monitor Anesthesia Care | Wound class: Contaminated

## 2015-12-21 MED ORDER — LIDOCAINE HCL (CARDIAC) 20 MG/ML IV SOLN
INTRAVENOUS | Status: DC | PRN
  Administered 2015-12-21: 40 mg via INTRAVENOUS

## 2015-12-21 MED ORDER — ONDANSETRON HCL 4 MG/2ML IJ SOLN: 4.0000 mg | Freq: Once | INTRAMUSCULAR | Status: DC | PRN

## 2015-12-21 MED ORDER — PROPOFOL 10 MG/ML IV BOLUS
INTRAVENOUS | Status: DC | PRN
  Administered 2015-12-21: 100 mg via INTRAVENOUS
  Administered 2015-12-21 (×4): 50 mg via INTRAVENOUS

## 2015-12-21 MED ORDER — ACETAMINOPHEN 160 MG/5ML PO SOLN: 325.0000 mg | ORAL | Status: DC | PRN

## 2015-12-21 MED ORDER — LACTATED RINGERS IV SOLN
INTRAVENOUS | Status: DC
  Administered 2015-12-21 (×2): via INTRAVENOUS

## 2015-12-21 MED ORDER — ACETAMINOPHEN 325 MG PO TABS: 325.0000 mg | ORAL_TABLET | ORAL | Status: DC | PRN

## 2015-12-21 MED ORDER — STERILE WATER FOR IRRIGATION IR SOLN
Status: DC | PRN
  Administered 2015-12-21: 09:00:00

## 2015-12-21 SURGICAL SUPPLY — 23 items
CANISTER SUCT 1200ML W/VALVE (MISCELLANEOUS) ×2 IMPLANT
CLIP HMST 235XBRD CATH ROT (MISCELLANEOUS) IMPLANT
CLIP RESOLUTION 360 11X235 (MISCELLANEOUS)
FCP ESCP3.2XJMB 240X2.8X (MISCELLANEOUS)
FORCEPS BIOP RAD 4 LRG CAP 4 (CUTTING FORCEPS) IMPLANT
FORCEPS BIOP RJ4 240 W/NDL (MISCELLANEOUS)
FORCEPS ESCP3.2XJMB 240X2.8X (MISCELLANEOUS) IMPLANT
GOWN CVR UNV OPN BCK APRN NK (MISCELLANEOUS) ×2 IMPLANT
GOWN ISOL THUMB LOOP REG UNIV (MISCELLANEOUS) ×2
INJECTOR VARIJECT VIN23 (MISCELLANEOUS) IMPLANT
KIT DEFENDO VALVE AND CONN (KITS) IMPLANT
KIT ENDO PROCEDURE OLY (KITS) ×2 IMPLANT
MARKER SPOT ENDO TATTOO 5ML (MISCELLANEOUS) IMPLANT
PAD GROUND ADULT SPLIT (MISCELLANEOUS) IMPLANT
PROBE APC STR FIRE (PROBE) IMPLANT
RETRIEVER NET ROTH 2.5X230 LF (MISCELLANEOUS) ×2 IMPLANT
SNARE SHORT THROW 13M SML OVAL (MISCELLANEOUS) IMPLANT
SNARE SHORT THROW 30M LRG OVAL (MISCELLANEOUS) IMPLANT
SNARE SNG USE RND 15MM (INSTRUMENTS) IMPLANT
SPOT EX ENDOSCOPIC TATTOO (MISCELLANEOUS)
TRAP ETRAP POLY (MISCELLANEOUS) IMPLANT
VARIJECT INJECTOR VIN23 (MISCELLANEOUS)
WATER STERILE IRR 250ML POUR (IV SOLUTION) ×2 IMPLANT

## 2015-12-21 NOTE — Anesthesia Preprocedure Evaluation (Signed)
Anesthesia Evaluation  Patient identified by MRN, date of birth, ID band Patient awake    Reviewed: Allergy & Precautions, NPO status , Patient's Chart, lab work & pertinent test results  Airway Mallampati: II  TM Distance: >3 FB Neck ROM: Full    Dental no notable dental hx.    Pulmonary Current Smoker,    Pulmonary exam normal        Cardiovascular negative cardio ROS Normal cardiovascular exam     Neuro/Psych PSYCHIATRIC DISORDERS Depression negative neurological ROS     GI/Hepatic negative GI ROS, Neg liver ROS,   Endo/Other  negative endocrine ROS  Renal/GU negative Renal ROS     Musculoskeletal negative musculoskeletal ROS (+)   Abdominal   Peds  Hematology negative hematology ROS (+)   Anesthesia Other Findings   Reproductive/Obstetrics                             Anesthesia Physical Anesthesia Plan  ASA: II  Anesthesia Plan: MAC   Post-op Pain Management:    Induction: Intravenous  Airway Management Planned:   Additional Equipment:   Intra-op Plan:   Post-operative Plan:   Informed Consent: I have reviewed the patients History and Physical, chart, labs and discussed the procedure including the risks, benefits and alternatives for the proposed anesthesia with the patient or authorized representative who has indicated his/her understanding and acceptance.     Plan Discussed with: CRNA  Anesthesia Plan Comments:         Anesthesia Quick Evaluation

## 2015-12-21 NOTE — Anesthesia Procedure Notes (Signed)
Procedure Name: MAC Performed by: Adamarys Shall Pre-anesthesia Checklist: Patient identified, Emergency Drugs available, Suction available, Patient being monitored and Timeout performed Patient Re-evaluated:Patient Re-evaluated prior to inductionOxygen Delivery Method: Nasal cannula       

## 2015-12-21 NOTE — Transfer of Care (Signed)
Immediate Anesthesia Transfer of Care Note  Patient: Anna Bates  Procedure(s) Performed: Procedure(s): COLONOSCOPY WITH PROPOFOL (N/A)  Patient Location: PACU  Anesthesia Type: MAC  Level of Consciousness: awake, alert  and patient cooperative  Airway and Oxygen Therapy: Patient Spontanous Breathing and Patient connected to supplemental oxygen  Post-op Assessment: Post-op Vital signs reviewed, Patient's Cardiovascular Status Stable, Respiratory Function Stable, Patent Airway and No signs of Nausea or vomiting  Post-op Vital Signs: Reviewed and stable  Complications: No apparent anesthesia complications

## 2015-12-21 NOTE — H&P (Signed)
  Anna Lame, MD [facility] [address], [place], [place] [address] Phone: [phone] Fax : [phone]  Primary Care Physician:  Anna Rana, MD Primary Gastroenterologist:  Dr. Allen Bates  Pre-Procedure History & Physical: HPI:  Anna Bates is a 50 y.o. female is here for a screening colonoscopy.   Past Medical History  Diagnosis Date  . Anemia     Past Surgical History  Procedure Laterality Date  . Shoulder surgery Right 05/09/2009  . Breast surgery  2002    Breast reduction -both  . Cholecystectomy  1991  . Cesarean section  1990  . Hernia repair  1980  . Right shoulder surgery Right 11/07/15    shoulder scope;   Marland Kitchen Reduction mammaplasty Bilateral 2000    Prior to Admission medications   Medication Sig Start Date End Date Taking? Authorizing Provider  acetaminophen (TYLENOL) 500 MG tablet Take 500 mg by mouth every 6 (six) hours as needed.   Yes Historical Provider, MD  cyanocobalamin 2000 MCG tablet Take 2,000 mcg by mouth daily.   Yes Historical Provider, MD  Vitamin D, Ergocalciferol, (DRISDOL) 50000 units CAPS capsule Take 1 capsule (50,000 Units total) by mouth every 7 (seven) days. 12/06/15  Yes Anna Rana, MD  diazepam (VALIUM) 5 MG tablet Take 5 mg by mouth every 6 (six) hours as needed for anxiety (1/2- 1 tab).    Historical Provider, MD  oxyCODONE-acetaminophen (PERCOCET/ROXICET) 5-325 MG tablet Take by mouth every 4 (four) hours as needed for severe pain.    Historical Provider, MD    Allergies as of 12/05/2015  . (No Known Allergies)    Family History  Problem Relation Age of Onset  . Hypertension Other   . Diabetes Other     Diabetes Mellitus type 2    Social History   Social History  . Marital Status: Single    Spouse Name: N/A  . Number of Children: N/A  . Years of Education: N/A   Occupational History  . Not on file.   Social History Main Topics  . Smoking status: Current Every Day Smoker -- 0.50 packs/day for 35 years  .  Smokeless tobacco: Not on file     Comment:    . Alcohol Use: 1.2 oz/week    2 Cans of beer per week  . Drug Use: No  . Sexual Activity: Not on file   Other Topics Concern  . Not on file   Social History Narrative    Review of Systems: See HPI, otherwise negative ROS  Physical Exam: Ht 5\' 8"  (1.727 m)  Wt 245 lb (111.131 kg)  BMI 37.26 kg/m2  LMP 12/02/2015 (Exact Date) General:   Alert,  pleasant and cooperative in NAD Head:  Normocephalic and atraumatic. Neck:  Supple; no masses or thyromegaly. Lungs:  Clear throughout to auscultation.    Heart:  Regular rate and rhythm. Abdomen:  Soft, nontender and nondistended. Normal bowel sounds, without guarding, and without rebound.   Neurologic:  Alert and  oriented x4;  grossly normal neurologically.  Impression/Plan: Anna Bates is now here to undergo a screening colonoscopy.  Risks, benefits, and alternatives regarding colonoscopy have been reviewed with the patient.  Questions have been answered.  All parties agreeable.

## 2015-12-21 NOTE — Op Note (Signed)
[facility] Patient Name: Anna Bates Procedure Date: 12/21/2015 8:40 AM MRN: [id number] Account #: [id number] Date of Birth: [birth year] Admit Type: Outpatient Age: 50 Room: [facility] OR ROOM 01 Gender: Female Note Status: Finalized Procedure:            Colonoscopy Indications:          Screening for colorectal malignant neoplasm Providers:            Lucilla Lame, MD Referring MD:         Jerrell Belfast, MD (Referring MD) Medicines:            Propofol per Anesthesia Complications:        No immediate complications. Procedure:            Pre-Anesthesia Assessment:                       - Prior to the procedure, a History and Physical was                        performed, and patient medications and allergies were                        reviewed. The patient's tolerance of previous                        anesthesia was also reviewed. The risks and benefits of                        the procedure and the sedation options and risks were                        discussed with the patient. All questions were                        answered, and informed consent was obtained. Prior                        Anticoagulants: The patient has taken no previous                        anticoagulant or antiplatelet agents. ASA Grade                        Assessment: II - A patient with mild systemic disease.                        After reviewing the risks and benefits, the patient was                        deemed in satisfactory condition to undergo the                        procedure.                       After obtaining informed consent, the colonoscope was                        passed under direct vision. Throughout the procedure,  the patient's blood pressure, pulse, and oxygen                        saturations were monitored continuously. The Olympus CF                        H180AL colonoscope (S#: [id number]) was introduced  through                        the anus and advanced to the the cecum, identified by                        appendiceal orifice and ileocecal valve. The                        colonoscopy was performed without difficulty. The                        patient tolerated the procedure well. The quality of                        the bowel preparation was excellent. Findings:      The perianal and digital rectal examinations were normal.      Multiple small-mouthed diverticula were found in the entire colon.      Non-bleeding internal hemorrhoids were found during retroflexion. The       hemorrhoids were Grade II (internal hemorrhoids that prolapse but reduce       spontaneously). Impression:           - Diverticulosis in the entire examined colon.                       - Non-bleeding internal hemorrhoids.                       - No specimens collected. Recommendation:       - Repeat colonoscopy in 10 years for screening unless                        any change in family history or lower GI problems. Procedure Code(s):    --- Professional ---                       [phone], Colonoscopy, flexible; diagnostic, including                        collection of specimen(s) by brushing or washing, when                        performed (separate procedure) Diagnosis Code(s):    --- Professional ---                       Z12.11, Encounter for screening for malignant neoplasm                        of colon CPT copyright 2016 [employer]. All rights reserved. The codes documented in this report are preliminary and upon coder review may  be revised to meet current compliance requirements. Lucilla Lame, MD 12/21/2015 9:10:03 AM This report has been signed electronically. Number of Addenda: 0  Note Initiated On: 12/21/2015 8:40 AM Scope Withdrawal Time: 0 hours 6 minutes 29 seconds  Total Procedure Duration: 0 hours 9 minutes 47 seconds       [facility]

## 2015-12-21 NOTE — Anesthesia Postprocedure Evaluation (Signed)
Anesthesia Post Note  Patient: Anna Bates  Procedure(s) Performed: Procedure(s) (LRB): COLONOSCOPY WITH PROPOFOL (N/A)  Patient location during evaluation: PACU Anesthesia Type: MAC Level of consciousness: awake and alert and oriented Pain management: pain level controlled Vital Signs Assessment: post-procedure vital signs reviewed and stable Respiratory status: spontaneous breathing and nonlabored ventilation Cardiovascular status: stable Postop Assessment: no signs of nausea or vomiting and adequate PO intake Anesthetic complications: no    Estill Batten

## 2015-12-22 ENCOUNTER — Encounter: Payer: Self-pay | Admitting: Gastroenterology

## 2015-12-25 ENCOUNTER — Encounter: Payer: Self-pay | Admitting: Physical Therapy

## 2015-12-25 ENCOUNTER — Ambulatory Visit: Payer: PRIVATE HEALTH INSURANCE | Admitting: Physical Therapy

## 2015-12-25 ENCOUNTER — Other Ambulatory Visit: Payer: Self-pay | Admitting: Hematology and Oncology

## 2015-12-25 DIAGNOSIS — M6281 Muscle weakness (generalized): Secondary | ICD-10-CM

## 2015-12-25 DIAGNOSIS — M25511 Pain in right shoulder: Secondary | ICD-10-CM | POA: Diagnosis not present

## 2015-12-25 DIAGNOSIS — R293 Abnormal posture: Secondary | ICD-10-CM

## 2015-12-25 DIAGNOSIS — M25611 Stiffness of right shoulder, not elsewhere classified: Secondary | ICD-10-CM

## 2015-12-25 NOTE — Therapy (Signed)
[place] MAIN [facility] SERVICES [address] [place], [place], [address] Phone: [phone]   Fax:  [phone]  Physical Therapy Treatment  Patient Details  Name: Anna Bates MRN: [id number] Date of Birth: 1965/06/13 Referring Provider: Dr. Onnie Graham  Encounter Date: 12/25/2015      PT End of Session - 12/25/15 1349    Visit Number 10   Number of Visits 24   Date for PT Re-Evaluation 01/22/16   Authorization Type worker's comp approved 12 visits   PT Start Time 1017   PT Stop Time 1100   PT Time Calculation (min) 43 min   Activity Tolerance Patient tolerated treatment well;No increased pain   Behavior During Therapy [facility] for tasks assessed/performed      Past Medical History  Diagnosis Date  . Anemia     Past Surgical History  Procedure Laterality Date  . Shoulder surgery Right 05/09/2009  . Breast surgery  2002    Breast reduction -both  . Cholecystectomy  1991  . Cesarean section  1990  . Hernia repair  1980  . Right shoulder surgery Right 11/07/15    shoulder scope;   Marland Kitchen Reduction mammaplasty Bilateral 2000  . Colonoscopy with propofol N/A 12/21/2015    Procedure: COLONOSCOPY WITH PROPOFOL;  Surgeon: Lucilla Lame, MD;  Location: [place];  Service: Endoscopy;  Laterality: N/A;    There were no vitals filed for this visit.      Subjective Assessment - 12/25/15 1020    Subjective Pt reports  that she did not have shoulder pain the for 5 days after her cortisone shot, her pain started again Saturday (7/15). She reports that she was able to sleep without meds during that time. She is still unable to perform HEP at home per MD's orders.    Pertinent History Personal factors affecting rehab: every day smoker; chronic shoulder pain with multiple injuries;    How long can you sit comfortably? sleeping in a recliner;    How long can you stand comfortably? NA   How long can you walk comfortably? NA   Diagnostic tests  no recent scans; goes back to see MD next Tuesday June 13th   Patient Stated Goals "To be able to have full shoulder ROM of RUE; be able to go back to work without limitations;  be able to swim, hang out with family;    Currently in Pain? Yes   Pain Score 4    Pain Location Shoulder   Pain Orientation Right   Pain Descriptors / Indicators Throbbing;Stabbing   Pain Onset More than a month ago      Treatment:  Arm bike backwards only, BUE, x3 min (unbilled)  Standing: Bilat. Shoulder extension, red tband with VCs to increase shoulder retraction and decrease excessive shoulder extension. D1(x10)/D2 (2x10), yellow tband resistance, reports of increase pain with D2 due to overhead movement. Shoulder ER against red tband decreased to yellow tband after 4 reps due to pain, 2x10, with min VCs not to decrease body movement to allow for only shoulder ER. Shoulder IR against yellow tband increased to red tband due to ease, 2x10, no pain  Seated:  Shoulder retraction, red tband resistance, 2 x10, min VCs to increase scapular movement   Shoulder open chain Alphabet with holding red weighted ball, 1000g, min VCs to perform in less shoulder flexion to decrease pain,  Instructed to perform with x3-4 breaks and to maintain scapular retraction   Shoulder dynamic stabilization; beginning  proximal and moving distal, open chain x multiple perturbations; closed chain on red ball, 2 x multiple perturbations, Min VCs to use stagger stance to align R shoulder into increased shoulder retraction.   Wall circles with towel x 10 clockwise, stopped counterclockwise after 2 reps due to pain, instructed to perform with scapular retraction. Flexion 2 x 7, no pain, VCs to increase shoulder retraction to increase stability and decrease evidence of pain.  Serratus anterior wall push ups, 2 x8, min verbal/tactile cueing to perform with only shoulder protraction not cervical.                               PT Education - 12/25/15 1349    Education provided Yes   Education Details exercises, increasing rest breaks   Person(s) Educated Patient   Methods Explanation;Tactile cues;Verbal cues;Demonstration   Comprehension Verbalized understanding;Returned demonstration;Verbal cues required             PT Long Term Goals - 12/25/15 1451    PT LONG TERM GOAL #1   Title Patient will be independent in home exercise program to improve strength/mobility for better functional independence with ADLs.    Baseline 44% disability 05/16/15   Time 4   Period Weeks   Status On-going   PT LONG TERM GOAL #2   Title  Patient will decrease Quick DASH score by > 8 points demonstrating reduced self-reported upper extremity disability.   Time 4   Period Weeks   Status New   PT LONG TERM GOAL #3   Title Patient will improve shoulder AROM to > 150 degrees of flexion, scaption, and abduction for improved ability to perform overhead activities.   Time 4   Period Weeks   Status Partially Met   PT LONG TERM GOAL #4   Title Patient will report a worst pain of 3/10 on VAS in    right shoulder         to improve tolerance with ADLs and reduced symptoms with activities.    Time 4   Period Weeks   Status On-going   PT LONG TERM GOAL #5   Title Patient will improve RUE shoulder strength to >4/5 for improved strength with lifting and ADLs to return to PLOF.    Time 4   Period Weeks   Status Partially Met               Plan - 12/25/15 1351    Clinical Impression Statement After last visit, patient notes that she is not supposed to be doing HEP at home due to continuing to have increased inflammation. Patient will be seeing PT 3xweek per MDs orders. Patient is able to perform activities throughout treatment but requires increased rest breaks to maintain pain level throughout treatment. Patient notes that her pain will increase to a 5/10 during overhead  exercises but will return back to baseline 4/10 after exercise is discontinued. Patient educated on taking longer breaks as needed but then performing exercise with good form (min VCs throughout) .  Patient would continue to benefit from PT in order to address pain, strength deficits, and increase ability to perform ADLs without pain.   Rehab Potential Good   Clinical Impairments Affecting Rehab Potential positive indicator: young in age, motivated, also has help at home; Negative indicator: chronic pain, smoker; Patient's clinical presentation is stable as her pain is localized to right shoulder and does not vary much;  PT Frequency 3x / week   PT Duration 4 weeks   PT Treatment/Interventions ADLs/Self Care Home Management;Cryotherapy;Electrical Stimulation;Iontophoresis 55m/ml Dexamethasone;Moist Heat;Therapeutic exercise;Therapeutic activities;Functional mobility training;Ultrasound;Neuromuscular re-education;Patient/family education;Manual techniques;Taping;Energy conservation;Dry needling;Passive range of motion   PT Next Visit Plan postural strengthening, RC ER sidelying, rows, scapular depression, scap rhythmic stabilization    PT Home Exercise Plan not conitnued per MDs orders   Consulted and Agree with Plan of Care Patient      Patient will benefit from skilled therapeutic intervention in order to improve the following deficits and impairments:  Hypomobility, Decreased activity tolerance, Decreased strength, Pain, Impaired UE functional use, Decreased range of motion, Improper body mechanics, Postural dysfunction, Impaired flexibility  Visit Diagnosis: Pain in right shoulder - Plan: PT plan of care cert/re-cert  Muscle weakness (generalized) - Plan: PT plan of care cert/re-cert  Abnormal posture - Plan: PT plan of care cert/re-cert  Stiffness of right shoulder, not elsewhere classified - Plan: PT plan of care cert/re-cert  Pain in joint of right shoulder - Plan: PT plan of care  cert/re-cert     Problem List Patient Active Problem List   Diagnosis Date Noted  . Special screening for malignant neoplasms, colon   . Thrombocytosis ([place] 12/06/2015  . Leukocytosis 12/06/2015  . Vitamin B12 deficiency anemia 12/01/2014  . Blood glucose elevated 12/01/2014  . Avitaminosis D 12/01/2014  . GU infection, trichomonal 12/01/2014  . Irregular bleeding 12/01/2014  . Anemia, iron deficiency 12/01/2014  . Essential (primary) hypertension 05/03/2009  . Depression, neurotic 02/03/2009   RTilman Neat SPT This entire session was performed under direct supervision and direction of a licensed therapist/therapist assistant . I have personally read, edited and approve of the note as written.  Trotter,Margaret PT, DPT  12/25/2015, 2:52 PM  [place] [facility] [address] [address]Phone: [phone]  Fax:  [phone] Name: KMARVIA TROOSTMRN: [id number] of Birth: [birth year]

## 2015-12-26 ENCOUNTER — Inpatient Hospital Stay: Payer: 59

## 2015-12-26 ENCOUNTER — Inpatient Hospital Stay: Payer: 59 | Attending: Hematology and Oncology | Admitting: Hematology and Oncology

## 2015-12-26 ENCOUNTER — Encounter: Payer: Self-pay | Admitting: Hematology and Oncology

## 2015-12-26 VITALS — BP 145/84 | HR 90 | Temp 98.2°F | Resp 18 | Ht 67.91 in | Wt 247.1 lb

## 2015-12-26 DIAGNOSIS — E538 Deficiency of other specified B group vitamins: Secondary | ICD-10-CM | POA: Diagnosis not present

## 2015-12-26 DIAGNOSIS — F5089 Other specified eating disorder: Secondary | ICD-10-CM | POA: Insufficient documentation

## 2015-12-26 DIAGNOSIS — D473 Essential (hemorrhagic) thrombocythemia: Secondary | ICD-10-CM | POA: Insufficient documentation

## 2015-12-26 DIAGNOSIS — F17210 Nicotine dependence, cigarettes, uncomplicated: Secondary | ICD-10-CM | POA: Insufficient documentation

## 2015-12-26 DIAGNOSIS — D509 Iron deficiency anemia, unspecified: Secondary | ICD-10-CM | POA: Diagnosis not present

## 2015-12-26 DIAGNOSIS — Z79899 Other long term (current) drug therapy: Secondary | ICD-10-CM | POA: Diagnosis not present

## 2015-12-26 DIAGNOSIS — D75839 Thrombocytosis, unspecified: Secondary | ICD-10-CM

## 2015-12-26 DIAGNOSIS — N920 Excessive and frequent menstruation with regular cycle: Secondary | ICD-10-CM | POA: Insufficient documentation

## 2015-12-26 LAB — CBC WITH DIFFERENTIAL/PLATELET
Basophils Absolute: 0.2 10*3/uL — ABNORMAL HIGH (ref 0–0.1)
Basophils Relative: 1 %
Eosinophils Absolute: 0.5 10*3/uL (ref 0–0.7)
Eosinophils Relative: 4 %
HCT: 34.5 % — ABNORMAL LOW (ref 35.0–47.0)
Hemoglobin: 11.1 g/dL — ABNORMAL LOW (ref 12.0–16.0)
Lymphocytes Relative: 23 %
Lymphs Abs: 3.3 10*3/uL (ref 1.0–3.6)
MCH: 24.5 pg — ABNORMAL LOW (ref 26.0–34.0)
MCHC: 32.3 g/dL (ref 32.0–36.0)
MCV: 76 fL — ABNORMAL LOW (ref 80.0–100.0)
Monocytes Absolute: 0.9 10*3/uL (ref 0.2–0.9)
Monocytes Relative: 7 %
Neutro Abs: 9.2 10*3/uL — ABNORMAL HIGH (ref 1.4–6.5)
Neutrophils Relative %: 65 %
Platelets: 923 10*3/uL (ref 150–400)
RBC: 4.53 MIL/uL (ref 3.80–5.20)
RDW: 17.8 % — ABNORMAL HIGH (ref 11.5–14.5)
WBC: 14.2 10*3/uL — ABNORMAL HIGH (ref 3.6–11.0)

## 2015-12-26 LAB — RETICULOCYTES
RBC.: 4.53 MIL/uL (ref 3.80–5.20)
Retic Count, Absolute: 95.1 10*3/uL (ref 19.0–183.0)
Retic Ct Pct: 2.1 % (ref 0.4–3.1)

## 2015-12-26 LAB — SEDIMENTATION RATE: Sed Rate: 22 mm/hr (ref 0–30)

## 2015-12-26 LAB — FOLATE: Folate: 7.3 ng/mL (ref >5.9)

## 2015-12-26 LAB — FERRITIN: Ferritin: 4 ng/mL — ABNORMAL LOW (ref 11–307)

## 2015-12-26 NOTE — Progress Notes (Signed)
Patient is here as a new patient no complaints today.

## 2015-12-26 NOTE — Progress Notes (Signed)
[facility] day:  12/26/2015  Chief Complaint: Anna Bates is a 50 y.o. female with thrombocytosis who is referred in consultation by Dr. Venia Minks for assessment and management.  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  She describes menses lasting for 7 days.  Menses start heavy, peak at day 3, then become light.  She typically uses 2 heavy duty pads and a super tampon every 4 hours.  She denies any dizziness or light headedness during her menses.    She denies any other bruising or bleeding.  She has had several procedures without excess bleeding (herniorrhaphy, C section, breast reduction, cholecystectomy, and shoulder surgery).  She previously saw Dr. Loistine Simas (last 04/26/2009).  In 04/2009, JAK2 and BCR-ABL were negative.  Labs from 02/24/2009 revealed a hematocrit of 35.8, hemoglobin 11.9, MCV 86, platelets 581,000, WBC 9100 with an [place] of 5700.  Ferritin was 4.  CBC on 04/26/2009 revealed a hematocrit of 40.9, hemoglobin 13.7, MCV 93 , and platelets 435,000.  Ferritin was 13.  CBC on 10/06/2010 revealed a platelet count of 640,000.  She was treated with oral iron for her iron deficiency.  She was diagnosed with B12 deficiency in 2007 or 2008.  She originally received injections through Dr. Loistine Simas.  Injections stopped on 04/26/2009.  She started oral B12 on 12/08/2015.  Colonoscopy on 12/21/2015 by Dr. Lucilla Lame revealed diverticulosis in the entire colon and non-bleeding internal hemorrhoids.  Regarding her diet, she does not drink milk.  She eats some vegetables and chicken.  She eats meat 4 times a week.  She has ice pica.  She states that she can eat ice 24 hours a day.  Labs on 12/05/2015 revealed a hematocrit of 35.0, hemoglobin 10.8, MCV 78, platelets 985,000, WBC 13,500 with an ANC of 7400.  CMP was normal.  TSH was 2.40.  B12 was 159 (low).  She denies any  symptoms.  She denies any melena or hematochezia.   Past Medical History  Diagnosis Date  . Anemia     Past Surgical History  Procedure Laterality Date  . Shoulder surgery Right 05/09/2009  . Breast surgery  2002    Breast reduction -both  . Cholecystectomy  1991  . Cesarean section  1990  . Hernia repair  1980  . Right shoulder surgery Right 11/07/15    shoulder scope;   Marland Kitchen Reduction mammaplasty Bilateral 2000  . Colonoscopy with propofol N/A 12/21/2015    Procedure: COLONOSCOPY WITH PROPOFOL;  Surgeon: Lucilla Lame, MD;  Location: [place];  Service: Endoscopy;  Laterality: N/A;    Family History  Problem Relation Age of Onset  . Hypertension Other   . Diabetes Other     Diabetes Mellitus type 2  . Diabetes Mother     Social History:  reports that she has been smoking.  She does not have any smokeless tobacco history on file. She reports that she drinks about 1.2 oz of alcohol per week. She reports that she does not use illicit drugs.  She works as a Corporate treasurer.  She has 4 children.  Her daughter has heavy menses.  The patient is alone today.  Allergies:  Allergies  Allergen Reactions  . Scallops [Shellfish Allergy] Nausea And Vomiting    Current Medications: Current Outpatient Prescriptions  Medication Sig Dispense Refill  . acetaminophen (TYLENOL) 500 MG tablet Take 500 mg by mouth every 6 (six) hours as  needed.    . cyanocobalamin 2000 MCG tablet Take 2,000 mcg by mouth daily.    . diazepam (VALIUM) 5 MG tablet Take 5 mg by mouth every 6 (six) hours as needed for anxiety (1/2- 1 tab).    . Vitamin D, Ergocalciferol, (DRISDOL) 50000 units CAPS capsule Take 1 capsule (50,000 Units total) by mouth every 7 (seven) days. 12 capsule 1   No current facility-administered medications for this visit.    Review of Systems:  GENERAL:  Feels good.  Active.  No fevers, sweats or weight loss. PERFORMANCE STATUS (ECOG):  0 HEENT:  No visual changes, runny nose, sore throat,  mouth sores or tenderness. Lungs: No shortness of breath or cough.  No hemoptysis. Cardiac:  No chest pain, palpitations, orthopnea, or PND. GI:  No nausea, vomiting, diarrhea, constipation, melena or hematochezia. GU:  Heavy menses (see HPI).  No urgency, frequency, dysuria, or hematuria. Musculoskeletal:  No back pain.  No joint pain.  No muscle tenderness. Extremities:  No pain or swelling. Skin:  No rashes or skin changes. Neuro:  No headache, numbness or weakness, balance or coordination issues. Endocrine:  No diabetes, thyroid issues, hot flashes or night sweats. Psych:  No mood changes, depression or anxiety. Pain:  No focal pain. Review of systems:  All other systems reviewed and found to be negative.  Physical Exam: Blood pressure 145/84, pulse 90, temperature 98.2 F (36.8 C), temperature source Tympanic, resp. rate 18, height 5' 7.91" (1.725 m), weight 247 lb 2.2 oz (112.1 kg), last menstrual period 12/02/2015. GENERAL:  Well developed, well nourished, woman sitting comfortably in the exam room in no acute distress. MENTAL STATUS:  Alert and oriented to person, place and time. HEAD:  Brown hair.  Normocephalic, atraumatic, face symmetric, no Cushingoid features. EYES:  Glasses.  Brown eyes.  Pupils equal round and reactive to light and accomodation.  No conjunctivitis or scleral icterus. ENT:  Oropharynx clear without lesion.  Tongue normal. Mucous membranes moist.  RESPIRATORY:  Clear to auscultation without rales, wheezes or rhonchi. CARDIOVASCULAR:  Regular rate and rhythm without murmur, rub or gallop. ABDOMEN:  Soft, non-tender, with active bowel sounds, and no appreciable hepatosplenomegaly.  No masses. SKIN:  No rashes, ulcers or lesions. EXTREMITIES: No edema, no skin discoloration or tenderness.  No palpable cords. LYMPH NODES: No palpable cervical, supraclavicular, axillary or inguinal adenopathy  NEUROLOGICAL: Unremarkable. PSYCH:  Appropriate.  No visits with  results within 3 Day(s) from this visit. Latest known visit with results is:  Office Visit on 12/04/2015  Component Date Value Ref Range Status  . WBC 12/05/2015 13.5* 3.4 - 10.8 x10E3/uL Final  . RBC 12/05/2015 4.49  3.77 - 5.28 x10E6/uL Final  . Hemoglobin 12/05/2015 10.8* 11.1 - 15.9 g/dL Final  . Hematocrit 12/05/2015 35.0  34.0 - 46.6 % Final  . MCV 12/05/2015 78* 79 - 97 fL Final  . MCH 12/05/2015 24.1* 26.6 - 33.0 pg Final  . MCHC 12/05/2015 30.9* 31.5 - 35.7 g/dL Final  . RDW 12/05/2015 18.8* 12.3 - 15.4 % Final  . Platelets 12/05/2015 985* 150 - 379 x10E3/uL Final  . Neutrophils 12/05/2015 54   Final  . Lymphs 12/05/2015 31   Final  . Monocytes 12/05/2015 8   Final  . Eos 12/05/2015 6   Final  . Basos 12/05/2015 1   Final  . Neutrophils Absolute 12/05/2015 7.4* 1.4 - 7.0 x10E3/uL Final  . Lymphocytes Absolute 12/05/2015 4.1* 0.7 - 3.1 x10E3/uL Final  . Monocytes Absolute  12/05/2015 1.0* 0.1 - 0.9 x10E3/uL Final  . EOS (ABSOLUTE) 12/05/2015 0.8* 0.0 - 0.4 x10E3/uL Final  . Basophils Absolute 12/05/2015 0.1  0.0 - 0.2 x10E3/uL Final  . Immature Granulocytes 12/05/2015 0   Final  . Immature Grans (Abs) 12/05/2015 0.0  0.0 - 0.1 x10E3/uL Final  . Glucose 12/05/2015 74  65 - 99 mg/dL Final  . BUN 12/05/2015 6  6 - 24 mg/dL Final  . Creatinine, Ser 12/05/2015 0.64  0.57 - 1.00 mg/dL Final  . GFR calc non Af Amer 12/05/2015 104  >59 mL/min/1.73 Final  . GFR calc Af Amer 12/05/2015 120  >59 mL/min/1.73 Final  . BUN/Creatinine Ratio 12/05/2015 9  9 - 23 Final  . Sodium 12/05/2015 140  134 - 144 mmol/L Final  . Potassium 12/05/2015 4.9  3.5 - 5.2 mmol/L Final  . Chloride 12/05/2015 102  96 - 106 mmol/L Final  . CO2 12/05/2015 20  18 - 29 mmol/L Final  . Calcium 12/05/2015 9.8  8.7 - 10.2 mg/dL Final  . Total Protein 12/05/2015 7.1  6.0 - 8.5 g/dL Final  . Albumin 12/05/2015 4.2  3.5 - 5.5 g/dL Final  . Globulin, Total 12/05/2015 2.9  1.5 - 4.5 g/dL Final  . Albumin/Globulin  Ratio 12/05/2015 1.4  1.2 - 2.2 Final  . Bilirubin Total 12/05/2015 0.2  0.0 - 1.2 mg/dL Final  . Alkaline Phosphatase 12/05/2015 52  39 - 117 IU/L Final  . AST 12/05/2015 17  0 - 40 IU/L Final  . ALT 12/05/2015 9  0 - 32 IU/L Final  . Hgb A1c MFr Bld 12/05/2015 5.8* 4.8 - 5.6 % Final   Comment:          Pre-diabetes: 5.7 - 6.4          Diabetes: >6.4          Glycemic control for adults with diabetes: <7.0   . Est. average glucose Bld gHb Est-m* 12/05/2015 120   Final  . Cholesterol, Total 12/05/2015 148  100 - 199 mg/dL Final  . Triglycerides 12/05/2015 124  0 - 149 mg/dL Final  . HDL 12/05/2015 51  >39 mg/dL Final  . VLDL Cholesterol Cal 12/05/2015 25  5 - 40 mg/dL Final  . LDL Calculated 12/05/2015 72  0 - 99 mg/dL Final  . Chol/HDL Ratio 12/05/2015 2.9  0.0 - 4.4 ratio units Final   Comment:                                   T. Chol/HDL Ratio                                             Men  Women                               1/2 Avg.Risk  3.4    3.3                                   Avg.Risk  5.0    4.4  2X Avg.Risk  9.6    7.1                                3X Avg.Risk 23.4   11.0   . TSH 12/05/2015 2.400  0.450 - 4.500 uIU/mL Final  . Vitamin B-12 12/05/2015 159* 211 - 946 pg/mL Final  . Vit D, 25-Hydroxy 12/05/2015 7.8* 30.0 - 100.0 ng/mL Final   Comment: Vitamin D deficiency has been defined by the [place] practice guideline as a level of serum 25-OH vitamin D less than 20 ng/mL (1,2). The Endocrine Society went on to further define vitamin D insufficiency as a level between 21 and 29 ng/mL (2). 1. IOM (Institute of Medicine). 2010. Dietary reference    intakes for calcium and D. [place]: The    [employer]. 2. Holick MF, Binkley , Bischoff-Ferrari HA, et al.    Evaluation, treatment, and prevention of vitamin D    deficiency: an Endocrine Society clinical practice    guideline.  JCEM. 2011 Jul; 96(7):1911-30.     Assessment:  Anna Bates is a 50 y.o. female with iron deficiency anemia and reactive thrombocytosis.  Platelet count has ranged between 435,000 - 640,000.  Platelet count was 985,000 on 12/05/2015.  JAK2 and BCR-ABL were negative in 04/2009.  She has heavy menses.  Colonoscopy on 12/21/2015 revealed diverticulosis in the entire colon and non-bleeding internal hemorrhoids.    Diet is modest.  She has ice pica.  She has B12 deficiency initially documented in 2007.  She was previously on B12 injections (stopped on 04/26/2009).  B12 was 159 (low) on 12/05/2015.  She started oral B12 on 12/08/2015.  Symptomatically, she denies any complaint.  Exam reveals no adenopathy or appreciable hepatosplenomegaly.  Plan: 1.  Review history of anemia and reactive thrombocytosis.  We discussed her iron deficiency anemia secondary to heavy menses.  I discussed follow-up with gynecology regarding management.  I discussed von Willebrand testing with her next heavy menses.  We discussed oral iron versus IV iron.  We discussed ferrous sulfate 1 tablet a day and advancing to 1 tablet BID or TID as tolerated with OJ or vitamin C.  If her anemia does not improve on oral iron, I discussed IV iron.  Anticipate improvement in platelet count as iron deficiency improves. 2.  Labs today:  CBC with diff, ESR, ferritin, folate, retic. 3.  RTC for von Willebrand testing if heavy menses (patient to call). 4.  Preauth Venofer. 5.  Referrral to G[facility]. 6.  RTC in 1 month for MD assess and labs (CBC with diff, ferritin, B12).   Lequita Asal, MD  12/26/2015, 12:23 PM

## 2015-12-27 ENCOUNTER — Ambulatory Visit: Payer: PRIVATE HEALTH INSURANCE | Admitting: Physical Therapy

## 2015-12-27 ENCOUNTER — Encounter: Payer: Self-pay | Admitting: Physical Therapy

## 2015-12-27 DIAGNOSIS — R293 Abnormal posture: Secondary | ICD-10-CM

## 2015-12-27 DIAGNOSIS — M25511 Pain in right shoulder: Secondary | ICD-10-CM | POA: Diagnosis not present

## 2015-12-27 DIAGNOSIS — M6281 Muscle weakness (generalized): Secondary | ICD-10-CM

## 2015-12-27 NOTE — Therapy (Signed)
[place] MAIN [facility] SERVICES [place], [place], [address] Phone: [phone]   Fax:  [phone]  Physical Therapy Treatment  Patient Details  Name: Anna Bates MRN: [id number] Date of Birth: Feb 01, 1966 Referring Provider: Dr. Onnie Graham  Encounter Date: 12/27/2015      PT End of Session - 12/27/15 0956    Visit Number 11   Number of Visits 24   Date for PT Re-Evaluation 01/22/16   Authorization Type worker's comp approved 12 visits   PT Start Time 0805   PT Stop Time 0845   PT Time Calculation (min) 40 min   Activity Tolerance Patient limited by pain   Behavior During Therapy [facility] for tasks assessed/performed      Past Medical History  Diagnosis Date  . Anemia     Past Surgical History  Procedure Laterality Date  . Shoulder surgery Right 05/09/2009  . Breast surgery  2002    Breast reduction -both  . Cholecystectomy  1991  . Cesarean section  1990  . Hernia repair  1980  . Right shoulder surgery Right 11/07/15    shoulder scope;   Marland Kitchen Reduction mammaplasty Bilateral 2000  . Colonoscopy with propofol N/A 12/21/2015    Procedure: COLONOSCOPY WITH PROPOFOL;  Surgeon: Lucilla Lame, MD;  Location: [place];  Service: Endoscopy;  Laterality: N/A;    There were no vitals filed for this visit.      Subjective Assessment - 12/27/15 0954    Subjective Pt reports 2/10 pain in R shoulder this morning but that pain was terrible last night to the point where she took tyenol and valium which is more than she regularly takes.     Pertinent History Personal factors affecting rehab: every day smoker; chronic shoulder pain with multiple injuries;    How long can you sit comfortably? sleeping in a recliner;    How long can you stand comfortably? NA   How long can you walk comfortably? NA   Diagnostic tests no recent scans; goes back to see MD next Tuesday June 13th   Patient Stated Goals "To be able to have full  shoulder ROM of RUE; be able to go back to work without limitations;  be able to swim, hang out with family;    Currently in Pain? Yes   Pain Score 2    Pain Location Shoulder   Pain Orientation Right   Pain Descriptors / Indicators Stabbing   Pain Type Surgical pain;Chronic pain   Pain Onset More than a month ago      Treatment  AAROM flexion, abduction and ER with 1.5lb wand standing x 15 reps each with RUE, min VCs for thumb positioning on wand Seated mid row, underhand grip mid row, 2 sets x 10 reps with red theraband resistance, min VCs for starting position in scapular retraction  Standing low row with extended elbows, 2 sets x 10 reps, min VCs for scapular retraction and full elbow extension  Sidelying flexion, 2 sets x 10 reps, target given for pt to reach overhead, no increased pain reported Sidelying ER with towel roll support under elbow, 2 sets x 10 reps, min VCs for positioning elbow next to side throughout  Supine serratus punches with 1lb weight, 2 sets x 10 reps, min VCs for increased ROM Supine chest press with 4lb wand deferred due to increased pain  Seated shoulder abduction with 1lb weight, 2 set x 10 reps, min VCs to relax upper traps  throughout  Iontophoresis patch with '4mg'$ /mL dexamethosone applied to R anterior medial deltoid, pt instructed to take off after 14 hours of wear time, encouraged to ice with patch as well   No HEP per MD orders                             PT Education - 12/27/15 0956    Education provided Yes   Education Details iontophoresis, icing    Person(s) Educated Patient   Methods Explanation;Verbal cues   Comprehension Verbalized understanding;Returned demonstration;Verbal cues required             PT Long Term Goals - 12/25/15 1451    PT LONG TERM GOAL #1   Title Patient will be independent in home exercise program to improve strength/mobility for better functional independence with ADLs.    Baseline 44%  disability 05/16/15   Time 4   Period Weeks   Status On-going   PT LONG TERM GOAL #2   Title  Patient will decrease Quick DASH score by > 8 points demonstrating reduced self-reported upper extremity disability.   Time 4   Period Weeks   Status New   PT LONG TERM GOAL #3   Title Patient will improve shoulder AROM to > 150 degrees of flexion, scaption, and abduction for improved ability to perform overhead activities.   Time 4   Period Weeks   Status Partially Met   PT LONG TERM GOAL #4   Title Patient will report a worst pain of 3/10 on VAS in    right shoulder         to improve tolerance with ADLs and reduced symptoms with activities.    Time 4   Period Weeks   Status On-going   PT LONG TERM GOAL #5   Title Patient will improve RUE shoulder strength to >4/5 for improved strength with lifting and ADLs to return to PLOF.    Time 4   Period Weeks   Status Partially Met               Plan - 12/27/15 0957    Clinical Impression Statement Pt reports R shoulder pain at 2/10 at start of session.  With exercise and close monitoring pts pain increased to 5/10 with exercise.  Pt pinpoints pain over R anterior/medial deltoid.  Pt performed seated retractions including mid row, underhand grip row, and low row without increase in pain using red theraband resistance.  Supine chest press with 4lb wand deferred due to pts increase in pain.  Ionto was applied to anterior medial deltoid and pt was given instructions to remove after 14 hours.  Will check next session to see if it provided any pain relief.  She will contiue to benefit from skilled physica therapy to increase RC and scapular strength while decreasing pain for more functional mobility.     Rehab Potential Good   Clinical Impairments Affecting Rehab Potential positive indicator: young in age, motivated, also has help at home; Negative indicator: chronic pain, smoker; Patient's clinical presentation is stable as her pain is localized to  right shoulder and does not vary much;    PT Frequency 3x / week   PT Duration 4 weeks   PT Treatment/Interventions ADLs/Self Care Home Management;Cryotherapy;Electrical Stimulation;Iontophoresis '4mg'$ /ml Dexamethasone;Moist Heat;Therapeutic exercise;Therapeutic activities;Functional mobility training;Ultrasound;Neuromuscular re-education;Patient/family education;Manual techniques;Taping;Energy conservation;Dry needling;Passive range of motion   PT Next Visit Plan postural strengthening, RC ER sidelying, rows, scapular depression, scap  rhythmic stabilization    PT Home Exercise Plan not conitnued per MDs orders   Consulted and Agree with Plan of Care Patient      Patient will benefit from skilled therapeutic intervention in order to improve the following deficits and impairments:  Hypomobility, Decreased activity tolerance, Decreased strength, Pain, Impaired UE functional use, Decreased range of motion, Improper body mechanics, Postural dysfunction, Impaired flexibility  Visit Diagnosis: Pain in right shoulder  Muscle weakness (generalized)  Abnormal posture     Problem List Patient Active Problem List   Diagnosis Date Noted  . Special screening for malignant neoplasms, colon   . Thrombocytosis ([place]) 12/06/2015  . Leukocytosis 12/06/2015  . Vitamin B12 deficiency anemia 12/01/2014  . Blood glucose elevated 12/01/2014  . Avitaminosis D 12/01/2014  . GU infection, trichomonal 12/01/2014  . Irregular bleeding 12/01/2014  . Anemia, iron deficiency 12/01/2014  . Essential (primary) hypertension 05/03/2009  . Depression, neurotic 02/03/2009   Stacy Gardner, SPT  This entire session was performed under direct supervision and direction of a licensed therapist/therapist assistant . I have personally read, edited and approve of the note as written.  Trotter,Margaret PT, DPT 12/27/2015, 1:18 PM  [place] MAIN [facility] SERVICES [address]  [place], [place], [address] Phone: [phone]   Fax:  [phone]  Name: Anna Bates MRN: [id number] Date of Birth: 1966/04/27

## 2015-12-29 ENCOUNTER — Ambulatory Visit: Payer: PRIVATE HEALTH INSURANCE | Admitting: Physical Therapy

## 2015-12-29 ENCOUNTER — Encounter: Payer: Self-pay | Admitting: Physical Therapy

## 2015-12-29 DIAGNOSIS — M25611 Stiffness of right shoulder, not elsewhere classified: Secondary | ICD-10-CM

## 2015-12-29 DIAGNOSIS — M6281 Muscle weakness (generalized): Secondary | ICD-10-CM

## 2015-12-29 DIAGNOSIS — M25511 Pain in right shoulder: Secondary | ICD-10-CM

## 2015-12-29 DIAGNOSIS — R293 Abnormal posture: Secondary | ICD-10-CM

## 2015-12-29 NOTE — Therapy (Signed)
[place] MAIN [facility] SERVICES [place], [place], [address] Phone: [phone]   Fax:  [phone]  Physical Therapy Treatment  Patient Details  Name: Anna Bates MRN: [id number] Date of Birth: 1965-12-27 Referring Provider: Dr. Onnie Graham  Encounter Date: 12/29/2015      PT End of Session - 12/29/15 1049    Visit Number 12   Number of Visits 24   Date for PT Re-Evaluation 01/22/16   Authorization Type worker's comp approved 12 visits   PT Start Time [id number]   PT Stop Time 0930   PT Time Calculation (min) 43 min   Activity Tolerance Patient limited by pain   Behavior During Therapy [facility] for tasks assessed/performed      Past Medical History  Diagnosis Date  . Anemia     Past Surgical History  Procedure Laterality Date  . Shoulder surgery Right 05/09/2009  . Breast surgery  2002    Breast reduction -both  . Cholecystectomy  1991  . Cesarean section  1990  . Hernia repair  1980  . Right shoulder surgery Right 11/07/15    shoulder scope;   Marland Kitchen Reduction mammaplasty Bilateral 2000  . Colonoscopy with propofol N/A 12/21/2015    Procedure: COLONOSCOPY WITH PROPOFOL;  Surgeon: Lucilla Lame, MD;  Location: [place];  Service: Endoscopy;  Laterality: N/A;    There were no vitals filed for this visit.      Subjective Assessment - 12/29/15 1047    Subjective Pt reports 2/10 pain in R shoulder this morning and that pain got to 5/10 during/after last therapy session, but did not have the same pain as the time before. She reports she had no problems with the ionto patch.    Pertinent History Personal factors affecting rehab: every day smoker; chronic shoulder pain with multiple injuries;    How long can you sit comfortably? sleeping in a recliner;    How long can you stand comfortably? NA   How long can you walk comfortably? NA   Diagnostic tests no recent scans; goes back to see MD next Tuesday June 13th   Patient  Stated Goals "To be able to have full shoulder ROM of RUE; be able to go back to work without limitations;  be able to swim, hang out with family;    Currently in Pain? Yes   Pain Score 2    Pain Location Shoulder   Pain Orientation Right   Pain Descriptors / Indicators Stabbing   Pain Onset More than a month ago       Treatment:  Warm up on 1.5# weight wand, shoulder abduction and ER, x10  Shoulder flexion x1 reps, patient got to just under 90 degrees with an increase in pain, discontinued  Prone: Shoulder rows: 3 x10, with 2# weight, no increase in pain Shoulder extension: 2 x 10, 2# weight, min. VCs to not perform another set due to increased pain. Rhythmic stabilization: 2 x multiple perturbations, proximal/distal elbow, instructed to maintain stability.  Sidelying: ER with no weight, towel under distal elbow for shoulder ABD, min VCs to perform to neutral not into shoulder IR  Supine: Towel under shoulder to increase shoulder positioning. Ball chops, unweighted, x10 towards each shoulder  Seated: Posture x 2 min, min VCs to maintain posture for extended time in order to decrease pain with impingement. Scapular rows, red tband, 2 x10, increase in upper trap pain Upper trap stretch, 2 x 15, instructed to maintain stretch  for prolonged period Shoulder ABD, 1#, x10 reps, 3 rest breaks, instructed not to push through pain  PT applied iontophoresis to right anterior/medial shoulder, Dexamethasone 23m/mL x8 min; Instructions to remove patch 14 hours since putting it on.                           PT Education - 12/29/15 1048    Education provided Yes   Education Details iontophoresis, maintaining posture,    Person(s) Educated Patient   Methods Explanation;Verbal cues   Comprehension Verbalized understanding;Verbal cues required             PT Long Term Goals - 12/25/15 1451    PT LONG TERM GOAL #1   Title Patient will be independent in home  exercise program to improve strength/mobility for better functional independence with ADLs.    Baseline 44% disability 05/16/15   Time 4   Period Weeks   Status On-going   PT LONG TERM GOAL #2   Title  Patient will decrease Quick DASH score by > 8 points demonstrating reduced self-reported upper extremity disability.   Time 4   Period Weeks   Status New   PT LONG TERM GOAL #3   Title Patient will improve shoulder AROM to > 150 degrees of flexion, scaption, and abduction for improved ability to perform overhead activities.   Time 4   Period Weeks   Status Partially Met   PT LONG TERM GOAL #4   Title Patient will report a worst pain of 3/10 on VAS in    right shoulder         to improve tolerance with ADLs and reduced symptoms with activities.    Time 4   Period Weeks   Status On-going   PT LONG TERM GOAL #5   Title Patient will improve RUE shoulder strength to >4/5 for improved strength with lifting and ADLs to return to PLOF.    Time 4   Period Weeks   Status Partially Met               Plan - 12/29/15 1050    Clinical Impression Statement Patient presents with 2/10 R shoulder pain that increased with activity to 5/10 especially seated shoulder ABD and standing bar shoulder flexion to right under 90 degrees. With large pain increases especially into shoulder flexion, activity was discontinued and patient told to rest in a good posture position until decreased. Patient instructed not to push through pain. Patient given Ionto at the end of session to medial/anterior deltoid with same instructions as last session.  Patient noted pain relief with last use. Patient would continue to benefit from skilled PT to increase strength and scapular stability as well as decreasing pain.    Rehab Potential Good   Clinical Impairments Affecting Rehab Potential positive indicator: young in age, motivated, also has help at home; Negative indicator: chronic pain, smoker; Patient's clinical  presentation is stable as her pain is localized to right shoulder and does not vary much;    PT Frequency 3x / week   PT Duration 4 weeks   PT Treatment/Interventions ADLs/Self Care Home Management;Cryotherapy;Electrical Stimulation;Iontophoresis 454mml Dexamethasone;Moist Heat;Therapeutic exercise;Therapeutic activities;Functional mobility training;Ultrasound;Neuromuscular re-education;Patient/family education;Manual techniques;Taping;Energy conservation;Dry needling;Passive range of motion   PT Next Visit Plan postural strengthening, RC ER sidelying, rows, scapular depression, scap rhythmic stabilization    PT Home Exercise Plan not conitnued per MDs orders   Consulted and Agree with Plan of Care  Patient      Patient will benefit from skilled therapeutic intervention in order to improve the following deficits and impairments:  Hypomobility, Decreased activity tolerance, Decreased strength, Pain, Impaired UE functional use, Decreased range of motion, Improper body mechanics, Postural dysfunction, Impaired flexibility  Visit Diagnosis: Pain in right shoulder  Muscle weakness (generalized)  Abnormal posture  Stiffness of right shoulder, not elsewhere classified  Pain in joint of right shoulder     Problem List Patient Active Problem List   Diagnosis Date Noted  . Special screening for malignant neoplasms, colon   . Thrombocytosis ([place]) 12/06/2015  . Leukocytosis 12/06/2015  . Vitamin B12 deficiency anemia 12/01/2014  . Blood glucose elevated 12/01/2014  . Avitaminosis D 12/01/2014  . GU infection, trichomonal 12/01/2014  . Irregular bleeding 12/01/2014  . Anemia, iron deficiency 12/01/2014  . Essential (primary) hypertension 05/03/2009  . Depression, neurotic 02/03/2009   Tilman Neat, SPT This entire session was performed under direct supervision and direction of a licensed therapist/therapist assistant . I have personally read, edited and approve of the note as  written.  Trotter,Margaret PT, DPT 12/29/2015, 11:06 AM  [place] MAIN [facility] SERVICES [address] [place], [place], [address] Phone: [phone]   Fax:  [phone]  Name: Anna Bates MRN: [id number] Date of Birth: 1965/09/21

## 2016-01-01 ENCOUNTER — Ambulatory Visit: Payer: PRIVATE HEALTH INSURANCE | Admitting: Physical Therapy

## 2016-01-01 DIAGNOSIS — M25511 Pain in right shoulder: Secondary | ICD-10-CM | POA: Diagnosis not present

## 2016-01-01 DIAGNOSIS — M6281 Muscle weakness (generalized): Secondary | ICD-10-CM

## 2016-01-01 DIAGNOSIS — M25611 Stiffness of right shoulder, not elsewhere classified: Secondary | ICD-10-CM

## 2016-01-01 DIAGNOSIS — R293 Abnormal posture: Secondary | ICD-10-CM

## 2016-01-01 NOTE — Therapy (Signed)
[place] MAIN [facility] SERVICES [place], [place], [address] Phone: [phone]   Fax:  [phone]  Physical Therapy Treatment  Patient Details  Name: Anna Bates MRN: [id number] Date of Birth: 06-22-1965 Referring Provider: Dr. Onnie Graham  Encounter Date: 01/01/2016      PT End of Session - 01/01/16 0901    Visit Number 13   Number of Visits 24   Date for PT Re-Evaluation 01/22/16   Authorization Type worker's comp approved 12 visits   PT Start Time 0808   PT Stop Time 0847   PT Time Calculation (min) 39 min   Activity Tolerance Patient limited by pain   Behavior During Therapy [facility] for tasks assessed/performed      Past Medical History:  Diagnosis Date  . Anemia     Past Surgical History:  Procedure Laterality Date  . BREAST SURGERY  2002   Breast reduction -both  . CESAREAN SECTION  1990  . CHOLECYSTECTOMY  1991  . COLONOSCOPY WITH PROPOFOL N/A 12/21/2015   Procedure: COLONOSCOPY WITH PROPOFOL;  Surgeon: Lucilla Lame, MD;  Location: [place];  Service: Endoscopy;  Laterality: N/A;  . HERNIA REPAIR  1980  . REDUCTION MAMMAPLASTY Bilateral 2000  . right shoulder surgery Right 11/07/15   shoulder scope;   . SHOULDER SURGERY Right 05/09/2009    There were no vitals filed for this visit.      Subjective Assessment - 01/01/16 0810    Subjective Patient reports that her shoulder is not feeling well this morning and that she is in 4-5/10 pain.  Patient reports she has pain after PT sessions but once she rests the pain will go down.     Pertinent History Personal factors affecting rehab: every day smoker; chronic shoulder pain with multiple injuries;    How long can you sit comfortably? sleeping in a recliner;    How long can you stand comfortably? NA   How long can you walk comfortably? NA   Diagnostic tests no recent scans; goes back to see MD next Tuesday June 13th   Patient Stated Goals "To be able  to have full shoulder ROM of RUE; be able to go back to work without limitations;  be able to swim, hang out with family;    Currently in Pain? Yes   Pain Score 5    Pain Location Shoulder   Pain Orientation Right   Pain Descriptors / Indicators Burning   Pain Onset More than a month ago      Treatment: Warm up with 1.5# weight wand, shoulder abduction and ER, x10   Prone (shoulder supported into extension with towel roll under proximal shoulder): Shoulder rows: 3x5, with 2# weight, decreased ROM by placing stool underneath arm so patient couldn't move into shoulder protraction/flexion. Shoulder horizontal abduction with ER: 2 x 6, no weight, min. VCs to maintain ER Rhythmic stabilization: 3 x multiple perturbations, proximal elbow, instructed to maintain stability. Patient reports mild increase in pain after prone exercises.  Sidelying: ER with no weight, 3x5, min VCs to perform to neutral not into shoulder IR. Patient reports less pain than with towel at distal elbow. Shoulder ABD, with 2-3 second hold at 90 degrees, patient notes pain relief. Mild pain when returning to side.  Seated: Posture, min VCs to maintain posture after exercises and during walking. Walking x 20 meters, with shoulder retraction and min VCs to perform when walking around and remind herself to maintain for intermittent  periods if not able to do all the time. Scapular rows, BUE, yellow tband, 3 x6,  Shoulder extension, BUE, yellow tband, 3 x6,  Upper trap stretch, 1 x 15 seconds,  After therapy session patient reported her pain was decreased to 2/10 pain.  Patient instructed to continue good posture throughout day.                           PT Education - 01/01/16 0858    Education provided Yes   Education Details pain relief, modalities, focusing on intermittent posture during walking    Person(s) Educated Patient   Methods Explanation;Verbal cues;Demonstration   Comprehension  Verbalized understanding;Returned demonstration;Verbal cues required             PT Long Term Goals - 12/25/15 1451      PT LONG TERM GOAL #1   Title Patient will be independent in home exercise program to improve strength/mobility for better functional independence with ADLs.    Baseline 44% disability 05/16/15   Time 4   Period Weeks   Status On-going     PT LONG TERM GOAL #2   Title  Patient will decrease Quick DASH score by > 8 points demonstrating reduced self-reported upper extremity disability.   Time 4   Period Weeks   Status New     PT LONG TERM GOAL #3   Title Patient will improve shoulder AROM to > 150 degrees of flexion, scaption, and abduction for improved ability to perform overhead activities.   Time 4   Period Weeks   Status Partially Met     PT LONG TERM GOAL #4   Title Patient will report a worst pain of 3/10 on VAS in    right shoulder         to improve tolerance with ADLs and reduced symptoms with activities.    Time 4   Period Weeks   Status On-going     PT LONG TERM GOAL #5   Title Patient will improve RUE shoulder strength to >4/5 for improved strength with lifting and ADLs to return to PLOF.    Time 4   Period Weeks   Status Partially Met               Plan - 01/01/16 0902    Clinical Impression Statement Patient presents with 4-5/10 R shoulder pain that maintains throughout most activities. Therapy session is limited due to patient pain. Patient understands and is given more instruction about appropriate posture to decrease shoulder IR and possible impingement. No movements above 90 degrees today other than 1.5# weighted bar into shoulder abduction and sidelying shoulder abudction. Patient would continue to benefit from skilled PT to address strength, postural stability, and decrease pain.    Rehab Potential Good   Clinical Impairments Affecting Rehab Potential positive indicator: young in age, motivated, also has help at home; Negative  indicator: chronic pain, smoker; Patient's clinical presentation is stable as her pain is localized to right shoulder and does not vary much;    PT Frequency 3x / week   PT Duration 4 weeks   PT Treatment/Interventions ADLs/Self Care Home Management;Cryotherapy;Electrical Stimulation;Iontophoresis 68m/ml Dexamethasone;Moist Heat;Therapeutic exercise;Therapeutic activities;Functional mobility training;Ultrasound;Neuromuscular re-education;Patient/family education;Manual techniques;Taping;Energy conservation;Dry needling;Passive range of motion   PT Next Visit Plan postural strengthening, RC ER sidelying, rows, scapular depression, scap rhythmic stabilization, TENs with movement   PT Home Exercise Plan not conitnued per MDs orders   Consulted and Agree  with Plan of Care Patient      Patient will benefit from skilled therapeutic intervention in order to improve the following deficits and impairments:  Hypomobility, Decreased activity tolerance, Decreased strength, Pain, Impaired UE functional use, Decreased range of motion, Improper body mechanics, Postural dysfunction, Impaired flexibility  Visit Diagnosis: Pain in right shoulder  Muscle weakness (generalized)  Abnormal posture  Stiffness of right shoulder, not elsewhere classified  Pain in joint of right shoulder     Problem List Patient Active Problem List   Diagnosis Date Noted  . Special screening for malignant neoplasms, colon   . Thrombocytosis ([place]) 12/06/2015  . Leukocytosis 12/06/2015  . Vitamin B12 deficiency anemia 12/01/2014  . Blood glucose elevated 12/01/2014  . Avitaminosis D 12/01/2014  . GU infection, trichomonal 12/01/2014  . Irregular bleeding 12/01/2014  . Anemia, iron deficiency 12/01/2014  . Essential (primary) hypertension 05/03/2009  . Depression, neurotic 02/03/2009   Tilman Neat, SPT This entire session was performed under direct supervision and direction of a licensed therapist/therapist assistant .  I have personally read, edited and approve of the note as written.  Trotter,Margaret PT, DPT 01/01/2016, 9:52 AM  [place] MAIN [facility] SERVICES [address] [place], [place], [address] Phone: [phone]   Fax:  [phone]  Name: SAMANDA BUSKE MRN: [id number] Date of Birth: 06-11-1965

## 2016-01-03 ENCOUNTER — Ambulatory Visit: Payer: PRIVATE HEALTH INSURANCE | Admitting: Physical Therapy

## 2016-01-03 ENCOUNTER — Encounter: Payer: Self-pay | Admitting: Physical Therapy

## 2016-01-03 DIAGNOSIS — R293 Abnormal posture: Secondary | ICD-10-CM

## 2016-01-03 DIAGNOSIS — M25511 Pain in right shoulder: Secondary | ICD-10-CM | POA: Diagnosis not present

## 2016-01-03 DIAGNOSIS — M6281 Muscle weakness (generalized): Secondary | ICD-10-CM

## 2016-01-03 NOTE — Therapy (Signed)
[place] MAIN [facility] SERVICES [place], [place], [address] Phone: [phone]   Fax:  [phone]  Physical Therapy Treatment  Patient Details  Name: Anna Bates MRN: [id number] Date of Birth: [birth year] Referring Provider: Dr. Onnie Graham  Encounter Date: 01/03/2016      PT End of Session - 01/03/16 0851    Visit Number 14   Number of Visits 24   Date for PT Re-Evaluation 01/22/16   Authorization Type worker's comp approved 12 visits   PT Start Time 0808   PT Stop Time 0846   PT Time Calculation (min) 38 min   Activity Tolerance Patient tolerated treatment well   Behavior During Therapy [facility] for tasks assessed/performed      Past Medical History:  Diagnosis Date  . Anemia     Past Surgical History:  Procedure Laterality Date  . BREAST SURGERY  2002   Breast reduction -both  . CESAREAN SECTION  1990  . CHOLECYSTECTOMY  1991  . COLONOSCOPY WITH PROPOFOL N/A 12/21/2015   Procedure: COLONOSCOPY WITH PROPOFOL;  Surgeon: Lucilla Lame, MD;  Location: [place];  Service: Endoscopy;  Laterality: N/A;  . HERNIA REPAIR  1980  . REDUCTION MAMMAPLASTY Bilateral 2000  . right shoulder surgery Right 11/07/15   shoulder scope;   . SHOULDER SURGERY Right 05/09/2009    There were no vitals filed for this visit.      Subjective Assessment - 01/03/16 0827    Subjective Pt reports shoulder pain at 2/10 today and that last night it woke her up in the middle of the night because the pain was so bad.     Pertinent History Personal factors affecting rehab: every day smoker; chronic shoulder pain with multiple injuries;    How long can you sit comfortably? sleeping in a recliner;    How long can you stand comfortably? NA   How long can you walk comfortably? NA   Diagnostic tests no recent scans; goes back to see MD next Tuesday June 13th   Patient Stated Goals "To be able to have full shoulder ROM of RUE; be able to go  back to work without limitations;  be able to swim, hang out with family;    Currently in Pain? Yes   Pain Score 2    Pain Location Shoulder   Pain Orientation Right   Pain Descriptors / Indicators Burning   Pain Type Surgical pain;Chronic pain   Pain Onset More than a month ago      Treatment  4 channel Interferential tens was applied on level 3, 120 Hz module 1 for prone and sidelying exercises for pain relief during exercise  Prone rows, 3 sets x 10 reps with 2lb, PT provided manual scapular upward rotation during movement, pt tolerated well with no increase in pain  Prone straight arm extensions, 3 sets x 10 reps with 2lb, provided manual scapular upward rotation during movement, emphasized no shoulder flexion movement  Sidelying ER, 3 sets x 8 reps with no weight, min VCs for shoulder retraction prior to movement  Sidelying overhead abduction, 3 sets x 10 reps, min VCs to position thumb out for comfort and decrease impingement   Tens removed with sitting exercises, pt was unable to tolerate level 3 and level 2 she was unable to feel TENs so it was removed due to distraction   Seated rows with yellow theraband resistance, 3 sets x 10 reps min VCs to reset and retraction of  R shoulder before each repetition  Seated ER with yellow theraband resistance, 2 sets x 10 reps min VCs to keep elbow by side and emphasize retraction with each repetition   Educated pt on sitting posture with pillow positioning to prevent IR when lounging                            PT Education - 01/03/16 0828    Education provided Yes   Education Details TENs, retracted positioning, posture    Person(s) Educated Patient   Methods Explanation;Verbal cues;Demonstration   Comprehension Verbalized understanding;Returned demonstration;Verbal cues required             PT Long Term Goals - 12/25/15 1451      PT LONG TERM GOAL #1   Title Patient will be independent in home exercise  program to improve strength/mobility for better functional independence with ADLs.    Baseline 44% disability 05/16/15   Time 4   Period Weeks   Status On-going     PT LONG TERM GOAL #2   Title  Patient will decrease Quick DASH score by > 8 points demonstrating reduced self-reported upper extremity disability.   Time 4   Period Weeks   Status New     PT LONG TERM GOAL #3   Title Patient will improve shoulder AROM to > 150 degrees of flexion, scaption, and abduction for improved ability to perform overhead activities.   Time 4   Period Weeks   Status Partially Met     PT LONG TERM GOAL #4   Title Patient will report a worst pain of 3/10 on VAS in    right shoulder         to improve tolerance with ADLs and reduced symptoms with activities.    Time 4   Period Weeks   Status On-going     PT LONG TERM GOAL #5   Title Patient will improve RUE shoulder strength to >4/5 for improved strength with lifting and ADLs to return to PLOF.    Time 4   Period Weeks   Status Partially Met               Plan - 01/03/16 0851    Clinical Impression Statement Pt reported 2/10 R shoulder pain today that she reports being tolerable.  Pt was able to perform all activites today without large increase in pain. TENS was applied to R shoulder for prone and sidelying activties to assist in pain relief.    Pt performed prone rows and retractions with 2lb weight without an increase in pain.  TENs was taken off for seated activites due to an increase in discomfort with it on.  Pt performed seated retractions and seated ER with yellow theraband with pain increasing to only 3/10 that was tolerable.  Pt was educated on sitting posture with pillow support and decrease internal rotation of R shoulder.  She would benefit from skilled physical therapy to continue to increase her RC and scapular strength and decrease pain for more functional mobility.     Rehab Potential Good   Clinical Impairments Affecting Rehab  Potential positive indicator: young in age, motivated, also has help at home; Negative indicator: chronic pain, smoker; Patient's clinical presentation is stable as her pain is localized to right shoulder and does not vary much;    PT Frequency 3x / week   PT Duration 4 weeks   PT Treatment/Interventions ADLs/Self Care  Home Management;Cryotherapy;Electrical Stimulation;Iontophoresis 25m/ml Dexamethasone;Moist Heat;Therapeutic exercise;Therapeutic activities;Functional mobility training;Ultrasound;Neuromuscular re-education;Patient/family education;Manual techniques;Taping;Energy conservation;Dry needling;Passive range of motion   PT Next Visit Plan postural strengthening, rhythmic stabilization, ER against wall,    PT Home Exercise Plan not conitnued per MDs orders   Consulted and Agree with Plan of Care Patient      Patient will benefit from skilled therapeutic intervention in order to improve the following deficits and impairments:  Hypomobility, Decreased activity tolerance, Decreased strength, Pain, Impaired UE functional use, Decreased range of motion, Improper body mechanics, Postural dysfunction, Impaired flexibility  Visit Diagnosis: Pain in right shoulder  Muscle weakness (generalized)  Abnormal posture     Problem List Patient Active Problem List   Diagnosis Date Noted  . Special screening for malignant neoplasms, colon   . Thrombocytosis ([place] 12/06/2015  . Leukocytosis 12/06/2015  . Vitamin B12 deficiency anemia 12/01/2014  . Blood glucose elevated 12/01/2014  . Avitaminosis D 12/01/2014  . GU infection, trichomonal 12/01/2014  . Irregular bleeding 12/01/2014  . Anemia, iron deficiency 12/01/2014  . Essential (primary) hypertension 05/03/2009  . Depression, neurotic 02/03/2009   TStacy Gardner SPT  This entire session was performed under direct supervision and direction of a licensed therapist/therapist assistant . I have personally read, edited and approve of the  note as written.  Trotter,Margaret PT, DPT 01/03/2016, 9:45 AM  [place] [facility] [address] [address]Phone: [phone]  Fax:  [phone] Name: Anna CROLLMRN: [id number] of Birth: [birth year]

## 2016-01-05 ENCOUNTER — Encounter: Payer: Self-pay | Admitting: Physical Therapy

## 2016-01-05 ENCOUNTER — Ambulatory Visit: Payer: PRIVATE HEALTH INSURANCE | Admitting: Physical Therapy

## 2016-01-05 DIAGNOSIS — M6281 Muscle weakness (generalized): Secondary | ICD-10-CM

## 2016-01-05 DIAGNOSIS — M25511 Pain in right shoulder: Secondary | ICD-10-CM

## 2016-01-05 DIAGNOSIS — R293 Abnormal posture: Secondary | ICD-10-CM

## 2016-01-05 NOTE — Therapy (Signed)
[place] MAIN [facility] SERVICES [place], [place], [address] Phone: [phone]   Fax:  [phone]  Physical Therapy Treatment  Patient Details  Name: Anna Bates MRN: [id number] Date of Birth: 18-Nov-1965 Referring Provider: Dr. Onnie Graham  Encounter Date: 01/05/2016      PT End of Session - 01/05/16 0851    Visit Number 15   Number of Visits 24   Date for PT Re-Evaluation 01/22/16   Authorization Type worker's comp approved 12 visits   PT Start Time 0801   PT Stop Time 0845   PT Time Calculation (min) 44 min   Activity Tolerance Patient limited by pain   Behavior During Therapy [facility] for tasks assessed/performed      Past Medical History:  Diagnosis Date  . Anemia     Past Surgical History:  Procedure Laterality Date  . BREAST SURGERY  2002   Breast reduction -both  . CESAREAN SECTION  1990  . CHOLECYSTECTOMY  1991  . COLONOSCOPY WITH PROPOFOL N/A 12/21/2015   Procedure: COLONOSCOPY WITH PROPOFOL;  Surgeon: Lucilla Lame, MD;  Location: [place];  Service: Endoscopy;  Laterality: N/A;  . HERNIA REPAIR  1980  . REDUCTION MAMMAPLASTY Bilateral 2000  . right shoulder surgery Right 11/07/15   shoulder scope;   . SHOULDER SURGERY Right 05/09/2009    There were no vitals filed for this visit.      Subjective Assessment - 01/05/16 0804    Subjective Pt reports shoulder pain of 1/10 today with no issues since previous session.     Pertinent History Personal factors affecting rehab: every day smoker; chronic shoulder pain with multiple injuries;    How long can you sit comfortably? sleeping in a recliner;    How long can you stand comfortably? NA   How long can you walk comfortably? NA   Diagnostic tests no recent scans; goes back to see MD next Tuesday June 13th   Patient Stated Goals "To be able to have full shoulder ROM of RUE; be able to go back to work without limitations;  be able to swim, hang out  with family;    Currently in Pain? Yes   Pain Score 1    Pain Location Shoulder   Pain Orientation Right   Pain Descriptors / Indicators Burning   Pain Type Chronic pain;Surgical pain   Pain Onset More than a month ago      Treatment  Arm bike 4 mins, backwards level 2, BUEs (unbilled)  Prone rows with 2lb weight, 3 sets x 10 reps, maunal assist scapular upward rotation to increase scapular control, min VCs for eccentric control  Prone extensions with 2lb weight, 3 sets x 10 reps, manual assist for scapular upward rotation to increase stability  Sidelying ER with 1lb weight,3 sets x 10 reps, min VCs for starting position  Serratus punches deferred due to pain  Supported shoulder flexion stretch deferred due to pain Seated rows with yellow theraband resistance, 2 sets x 10 reps, min VCs to roll shoulders back and down before performing each repetition  Sidelying shoulder abduction to 90 degrees without weight, 2 sets x 10 reps, min VCs for slow eccentric control   Finished with cryotherapy (unbilled) to right shoulder x5 min;                         PT Education - 01/05/16 0850    Education provided Yes  Education Details icing, posture    Person(s) Educated Patient   Methods Explanation;Demonstration;Verbal cues   Comprehension Verbalized understanding;Returned demonstration;Verbal cues required             PT Long Term Goals - 12/25/15 1451      PT LONG TERM GOAL #1   Title Patient will be independent in home exercise program to improve strength/mobility for better functional independence with ADLs.    Baseline 44% disability 05/16/15   Time 4   Period Weeks   Status On-going     PT LONG TERM GOAL #2   Title  Patient will decrease Quick DASH score by > 8 points demonstrating reduced self-reported upper extremity disability.   Time 4   Period Weeks   Status New     PT LONG TERM GOAL #3   Title Patient will improve shoulder AROM to > 150  degrees of flexion, scaption, and abduction for improved ability to perform overhead activities.   Time 4   Period Weeks   Status Partially Met     PT LONG TERM GOAL #4   Title Patient will report a worst pain of 3/10 on VAS in    right shoulder         to improve tolerance with ADLs and reduced symptoms with activities.    Time 4   Period Weeks   Status On-going     PT LONG TERM GOAL #5   Title Patient will improve RUE shoulder strength to >4/5 for improved strength with lifting and ADLs to return to PLOF.    Time 4   Period Weeks   Status Partially Met               Plan - 01/05/16 0851    Clinical Impression Statement Pt reports 1/10 pain today at beginning of session.  She continues to do no execises at home due to MDs orders.  Pt was able to advance to using 2lb weight with prone retractions and extensions and 1lb weight with sidelying ER without an increase in pain.  Pt continues to be limited by pain with shoulder flexion and abduction without weight.  Serratus punches and supported flexion stretch were deferred due to pain.  Pt reports that increased pain lasts several hours and then goes away.  Emphasized importance of icing throughout the day especially after therapy.  Pt would continue to benefit from skilled physical therapy to increase her scapular strength, tolerance to flexion and abduction to increase her functional mobility.     Rehab Potential Good   Clinical Impairments Affecting Rehab Potential positive indicator: young in age, motivated, also has help at home; Negative indicator: chronic pain, smoker; Patient's clinical presentation is stable as her pain is localized to right shoulder and does not vary much;    PT Frequency 3x / week   PT Duration 4 weeks   PT Treatment/Interventions ADLs/Self Care Home Management;Cryotherapy;Electrical Stimulation;Iontophoresis 56m/ml Dexamethasone;Moist Heat;Therapeutic exercise;Therapeutic activities;Functional mobility  training;Ultrasound;Neuromuscular re-education;Patient/family education;Manual techniques;Taping;Energy conservation;Dry needling;Passive range of motion   PT Next Visit Plan postural strengthening, rhythmic stabilization, ER against wall,    PT Home Exercise Plan not conitnued per MDs orders   Consulted and Agree with Plan of Care Patient      Patient will benefit from skilled therapeutic intervention in order to improve the following deficits and impairments:  Hypomobility, Decreased activity tolerance, Decreased strength, Pain, Impaired UE functional use, Decreased range of motion, Improper body mechanics, Postural dysfunction, Impaired flexibility  Visit Diagnosis:  Pain in right shoulder  Muscle weakness (generalized)  Abnormal posture     Problem List Patient Active Problem List   Diagnosis Date Noted  . Special screening for malignant neoplasms, colon   . Thrombocytosis ([place]) 12/06/2015  . Leukocytosis 12/06/2015  . Vitamin B12 deficiency anemia 12/01/2014  . Blood glucose elevated 12/01/2014  . Avitaminosis D 12/01/2014  . GU infection, trichomonal 12/01/2014  . Irregular bleeding 12/01/2014  . Anemia, iron deficiency 12/01/2014  . Essential (primary) hypertension 05/03/2009  . Depression, neurotic 02/03/2009   Stacy Gardner, SPT  This entire session was performed under direct supervision and direction of a licensed therapist/therapist assistant . I have personally read, edited and approve of the note as written.  Trotter,Margaret 01/05/2016, 9:19 AM  [place] MAIN [facility] SERVICES [address] [place], [place], [address] Phone: [phone]   Fax:  [phone]  Name: Anna Bates MRN: [id number] Date of Birth: 05-Oct-1965

## 2016-01-08 ENCOUNTER — Ambulatory Visit: Payer: PRIVATE HEALTH INSURANCE | Admitting: Physical Therapy

## 2016-01-08 ENCOUNTER — Encounter: Payer: Self-pay | Admitting: Physical Therapy

## 2016-01-08 DIAGNOSIS — M25511 Pain in right shoulder: Secondary | ICD-10-CM

## 2016-01-08 DIAGNOSIS — M6281 Muscle weakness (generalized): Secondary | ICD-10-CM

## 2016-01-08 DIAGNOSIS — R293 Abnormal posture: Secondary | ICD-10-CM

## 2016-01-08 NOTE — Therapy (Signed)
[place] MAIN [facility] SERVICES [place], [place], [address] Phone: [phone]   Fax:  [phone]  Physical Therapy Treatment  Patient Details  Name: Anna Bates MRN: [id number] Date of Birth: [birth year] Referring Provider: Dr. Onnie Graham  Encounter Date: 01/08/2016      PT End of Session - 01/08/16 1039    Visit Number 16   Number of Visits 24   Date for PT Re-Evaluation 01/22/16   Authorization Type worker's comp approved 12 visits   PT Start Time 0804   PT Stop Time 0845   PT Time Calculation (min) 41 min   Activity Tolerance Patient limited by pain   Behavior During Therapy [facility] for tasks assessed/performed      Past Medical History:  Diagnosis Date  . Anemia     Past Surgical History:  Procedure Laterality Date  . BREAST SURGERY  2002   Breast reduction -both  . CESAREAN SECTION  1990  . CHOLECYSTECTOMY  1991  . COLONOSCOPY WITH PROPOFOL N/A 12/21/2015   Procedure: COLONOSCOPY WITH PROPOFOL;  Surgeon: Lucilla Lame, MD;  Location: [place];  Service: Endoscopy;  Laterality: N/A;  . HERNIA REPAIR  1980  . REDUCTION MAMMAPLASTY Bilateral 2000  . right shoulder surgery Right 11/07/15   shoulder scope;   . SHOULDER SURGERY Right 05/09/2009    There were no vitals filed for this visit.      Subjective Assessment - 01/08/16 0822    Subjective Pt reports 2/10 shoulder pain that was aggravated over the weekend to 5/10 while doing laundry.     Pertinent History Personal factors affecting rehab: every day smoker; chronic shoulder pain with multiple injuries;    How long can you sit comfortably? sleeping in a recliner;    How long can you stand comfortably? NA   How long can you walk comfortably? NA   Diagnostic tests no recent scans; goes back to see MD next Tuesday June 13th   Currently in Pain? Yes   Pain Score 2    Pain Location Shoulder   Pain Orientation Right   Pain Descriptors / Indicators  Burning   Pain Type Chronic pain;Surgical pain   Pain Onset More than a month ago     Treatment  Central PAs to thoracic spine T3-T8 grade 3, 3 bouts x 30 seconds each segment (12 mins total) Supine shoulder abduction over half bolster, 2 sets x 10 reps, min VCs to end movement before increase in pain  Sidelying ER, no weight, 2 sets x 10 reps, min VCs to keep shoulder retracted with each repetition  Standing bent over mid row with 2lb weight, 2 sets x 10 reps, min VCs for straight back and slow eccentric control  Standing extensions with 2lb weight, 2 sets x 10 reps, min VCs to not increase range past shoulder height   Seated thoracic extensions over chair with arms externall rotated by side, 2 sets x 10 reps, min VCs for increased range  Scapular retractions seated with 90 degrees elbow flexion, 2 sets x 10, min tactile cue to pinch shoulder blades and hold each rep for 3 seconds                             PT Education - 01/08/16 1038    Education provided Yes   Education Details icing, posture, thoracic mobs    Person(s) Educated Patient   Methods Explanation;Demonstration;Verbal cues  Comprehension Verbalized understanding;Returned demonstration;Verbal cues required             PT Long Term Goals - 12/25/15 1451      PT LONG TERM GOAL #1   Title Patient will be independent in home exercise program to improve strength/mobility for better functional independence with ADLs.    Baseline 44% disability 05/16/15   Time 4   Period Weeks   Status On-going     PT LONG TERM GOAL #2   Title  Patient will decrease Quick DASH score by > 8 points demonstrating reduced self-reported upper extremity disability.   Time 4   Period Weeks   Status New     PT LONG TERM GOAL #3   Title Patient will improve shoulder AROM to > 150 degrees of flexion, scaption, and abduction for improved ability to perform overhead activities.   Time 4   Period Weeks   Status  Partially Met     PT LONG TERM GOAL #4   Title Patient will report a worst pain of 3/10 on VAS in    right shoulder         to improve tolerance with ADLs and reduced symptoms with activities.    Time 4   Period Weeks   Status On-going     PT LONG TERM GOAL #5   Title Patient will improve RUE shoulder strength to >4/5 for improved strength with lifting and ADLs to return to PLOF.    Time 4   Period Weeks   Status Partially Met               Plan - 01/08/16 1039    Clinical Impression Statement Pt reports 2/10 pain in R shoulder today and that she was unable to sleep well last night due to pain.  Over the weekend she did laundry with minimal use of her RUE and pain increased to 5/10 and remained there the rest of the day.  Pt was able to tolerate thoracic mobs and pt reported they felt good and that her upper back felt looser after.  Pt continues to report pain with all eccentric lowering movements gravity assisted and gravity unassisted.  Pt does not seem to have any AROM that does not increase pain at this point.  Pt progressed to standing rows and extensions with 2lb weight without an excessive increase in pain.  She would continue to beneift from skilled physical therapy to improve her strength, decrease pain and increase functional mobility.     Rehab Potential Good   Clinical Impairments Affecting Rehab Potential positive indicator: young in age, motivated, also has help at home; Negative indicator: chronic pain, smoker; Patient's clinical presentation is stable as her pain is localized to right shoulder and does not vary much;    PT Frequency 3x / week   PT Duration 4 weeks   PT Treatment/Interventions ADLs/Self Care Home Management;Cryotherapy;Electrical Stimulation;Iontophoresis 28m/ml Dexamethasone;Moist Heat;Therapeutic exercise;Therapeutic activities;Functional mobility training;Ultrasound;Neuromuscular re-education;Patient/family education;Manual techniques;Taping;Energy  conservation;Dry needling;Passive range of motion   PT Next Visit Plan thoracic mobs    PT Home Exercise Plan not conitnued per MDs orders   Consulted and Agree with Plan of Care Patient      Patient will benefit from skilled therapeutic intervention in order to improve the following deficits and impairments:  Hypomobility, Decreased activity tolerance, Decreased strength, Pain, Impaired UE functional use, Decreased range of motion, Improper body mechanics, Postural dysfunction, Impaired flexibility  Visit Diagnosis: Pain in right shoulder  Muscle weakness (  generalized)  Abnormal posture     Problem List Patient Active Problem List   Diagnosis Date Noted  . Special screening for malignant neoplasms, colon   . Thrombocytosis ([place]) 12/06/2015  . Leukocytosis 12/06/2015  . Vitamin B12 deficiency anemia 12/01/2014  . Blood glucose elevated 12/01/2014  . Avitaminosis D 12/01/2014  . GU infection, trichomonal 12/01/2014  . Irregular bleeding 12/01/2014  . Anemia, iron deficiency 12/01/2014  . Essential (primary) hypertension 05/03/2009  . Depression, neurotic 02/03/2009   Stacy Gardner, SPT  This entire session was performed under direct supervision and direction of a licensed therapist/therapist assistant . I have personally read, edited and approve of the note as written.  Trotter,Margaret  PT, DPT 01/08/2016, 10:53 AM  [place] MAIN [facility] SERVICES [address] [place], [place], [address] Phone: [phone]   Fax:  [phone]  Name: Anna Bates MRN: [id number] Date of Birth: 26-Dec-1965

## 2016-01-10 ENCOUNTER — Ambulatory Visit: Payer: PRIVATE HEALTH INSURANCE | Attending: Orthopedic Surgery | Admitting: Physical Therapy

## 2016-01-10 ENCOUNTER — Encounter: Payer: Self-pay | Admitting: Physical Therapy

## 2016-01-10 DIAGNOSIS — N920 Excessive and frequent menstruation with regular cycle: Secondary | ICD-10-CM | POA: Diagnosis not present

## 2016-01-10 DIAGNOSIS — M25511 Pain in right shoulder: Secondary | ICD-10-CM | POA: Insufficient documentation

## 2016-01-10 DIAGNOSIS — M6281 Muscle weakness (generalized): Secondary | ICD-10-CM | POA: Diagnosis present

## 2016-01-10 DIAGNOSIS — M25611 Stiffness of right shoulder, not elsewhere classified: Secondary | ICD-10-CM | POA: Insufficient documentation

## 2016-01-10 DIAGNOSIS — R293 Abnormal posture: Secondary | ICD-10-CM | POA: Diagnosis present

## 2016-01-10 NOTE — Therapy (Signed)
[place] MAIN [facility] SERVICES [address] [place], [place], [address] Phone: [phone]   Fax:  [phone]  Physical Therapy Treatment  Patient Details  Name: Anna Bates MRN: [id number] Date of Birth: [birth year] Referring Provider: Dr. Onnie Graham  Encounter Date: 01/10/2016      PT End of Session - 01/10/16 0850    Visit Number 17   Number of Visits 24   Date for PT Re-Evaluation 01/22/16   Authorization Type worker's comp approved 12 visits   PT Start Time 0804   PT Stop Time 0845   PT Time Calculation (min) 41 min   Activity Tolerance Patient limited by pain;Patient tolerated treatment well;No increased pain   Behavior During Therapy WFL for tasks assessed/performed      Past Medical History:  Diagnosis Date  . Anemia     Past Surgical History:  Procedure Laterality Date  . BREAST SURGERY  2002   Breast reduction -both  . CESAREAN SECTION  1990  . CHOLECYSTECTOMY  1991  . COLONOSCOPY WITH PROPOFOL N/A 12/21/2015   Procedure: COLONOSCOPY WITH PROPOFOL;  Surgeon: Lucilla Lame, MD;  Location: [place];  Service: Endoscopy;  Laterality: N/A;  . HERNIA REPAIR  1980  . REDUCTION MAMMAPLASTY Bilateral 2000  . right shoulder surgery Right 11/07/15   shoulder scope;   . SHOULDER SURGERY Right 05/09/2009    There were no vitals filed for this visit.      Subjective Assessment - 01/10/16 0802    Subjective Pt reports that her shoulder felt good monday and tuesday after mondays session but that Tuesday after dinner her shoulder hurt by reaching over the salad bar at a rest[employer]ant.  Pt reports 3/10 pain today.     Pertinent History Personal factors affecting rehab: every day smoker; chronic shoulder pain with multiple injuries;    How long can you sit comfortably? sleeping in a recliner;    How long can you stand comfortably? NA   How long can you walk comfortably? NA   Diagnostic tests no recent scans; goes back to  see MD next Tuesday June 13th   Currently in Pain? Yes   Pain Score 3    Pain Location Shoulder   Pain Orientation Right   Pain Descriptors / Indicators Burning   Pain Type Chronic pain;Surgical pain   Pain Onset More than a month ago       Treatment  Prone central PAs grade 4 to T2-T9, 2 sets x 30 second bouts each segment, pt reported increased looseness in thoracic spine after manual (13 minutes)  Shoulder abduction in supine over half bolster supporting scapular spine, no increase in pain, min VCs to keep thumb positioned out  Thoracic extensions over chair, with arms by side, 2 sets x 10 reps, min VCs for slow controlled movement  Seated scapular retractions with 3 second isometric holds, 2 sets x 10 reps, min VCs to keep thumb up and squeeze shoulder blades together  Seated ER with towel supporting under arm, 2 sets x 10 reps, min VCs to keep elbow next to arm  Standing rows with 2 lb weight,2 sets x 10 reps, min VCs for slow eccentric control Standing extensions with 2 lb weight, 2 sets x 10 reps, min VCs to stop at neutral and not go into shoulder flexion   Pt reports decrease in pain from 3/10 to 2/10 after session.   No HEP per MDs orders  PT Education - 01/10/16 0850    Education provided Yes   Education Details posture, purpose of thoracic mobs, continuing to ice    Person(s) Educated Patient   Methods Explanation;Demonstration;Verbal cues   Comprehension Verbalized understanding;Returned demonstration;Verbal cues required             PT Long Term Goals - 12/25/15 1451      PT LONG TERM GOAL #1   Title Patient will be independent in home exercise program to improve strength/mobility for better functional independence with ADLs.    Baseline 44% disability 05/16/15   Time 4   Period Weeks   Status On-going     PT LONG TERM GOAL #2   Title  Patient will decrease Quick DASH score by > 8 points demonstrating reduced  self-reported upper extremity disability.   Time 4   Period Weeks   Status New     PT LONG TERM GOAL #3   Title Patient will improve shoulder AROM to > 150 degrees of flexion, scaption, and abduction for improved ability to perform overhead activities.   Time 4   Period Weeks   Status Partially Met     PT LONG TERM GOAL #4   Title Patient will report a worst pain of 3/10 on VAS in    right shoulder         to improve tolerance with ADLs and reduced symptoms with activities.    Time 4   Period Weeks   Status On-going     PT LONG TERM GOAL #5   Title Patient will improve RUE shoulder strength to >4/5 for improved strength with lifting and ADLs to return to PLOF.    Time 4   Period Weeks   Status Partially Met               Plan - 01/10/16 9371    Clinical Impression Statement Pt tolerated treatment well today with a decrease in pain from 3/10 to 2/10 in R shoulder.  Pt reported that she had two good days after last therapy session without an increase in pain.  Thoracic mobs and extensions over the back of the chair seemed to provide some relief to thoracic spine with increased looseness.  Pt performed standing rows and extensions with good form and without an increase in pain.  Pt performed seated external rotation supported on the table without pain.  She would continue to benefit from skilled physical therapy to improve scapular strength, and decrease pain for more functional use of her RUE.Marland Kitchen     Rehab Potential Good   Clinical Impairments Affecting Rehab Potential positive indicator: young in age, motivated, also has help at home; Negative indicator: chronic pain, smoker; Patient's clinical presentation is stable as her pain is localized to right shoulder and does not vary much;    PT Frequency 3x / week   PT Duration 4 weeks   PT Treatment/Interventions ADLs/Self Care Home Management;Cryotherapy;Electrical Stimulation;Iontophoresis '4mg'$ /ml Dexamethasone;Moist Heat;Therapeutic  exercise;Therapeutic activities;Functional mobility training;Ultrasound;Neuromuscular re-education;Patient/family education;Manual techniques;Taping;Energy conservation;Dry needling;Passive range of motion   PT Next Visit Plan continue thoracic mobs, no overhead activity or flexion, seated ER on table    PT Home Exercise Plan not conitnued per MDs orders   Consulted and Agree with Plan of Care Patient      Patient will benefit from skilled therapeutic intervention in order to improve the following deficits and impairments:  Hypomobility, Decreased activity tolerance, Decreased strength, Pain, Impaired UE functional use, Decreased range of motion, Improper  body mechanics, Postural dysfunction, Impaired flexibility  Visit Diagnosis: Pain in right shoulder  Muscle weakness (generalized)  Abnormal posture     Problem List Patient Active Problem List   Diagnosis Date Noted  . Special screening for malignant neoplasms, colon   . Thrombocytosis ([place]) 12/06/2015  . Leukocytosis 12/06/2015  . Vitamin B12 deficiency anemia 12/01/2014  . Blood glucose elevated 12/01/2014  . Avitaminosis D 12/01/2014  . GU infection, trichomonal 12/01/2014  . Irregular bleeding 12/01/2014  . Anemia, iron deficiency 12/01/2014  . Essential (primary) hypertension 05/03/2009  . Depression, neurotic 02/03/2009   Stacy Gardner, SPT  This entire session was performed under direct supervision and direction of a licensed therapist/therapist assistant . I have personally read, edited and approve of the note as written.  Trotter,Margaret PT, DPT 01/10/2016, 10:12 AM  [place] MAIN [facility] SERVICES [address] [place], [place], [address] Phone: [phone]   Fax:  [phone]  Name: Anna Bates MRN: [id number] Date of Birth: Mar 03, 1966

## 2016-01-12 ENCOUNTER — Encounter: Payer: Self-pay | Admitting: Physical Therapy

## 2016-01-12 ENCOUNTER — Ambulatory Visit: Payer: PRIVATE HEALTH INSURANCE | Admitting: Physical Therapy

## 2016-01-12 DIAGNOSIS — M25511 Pain in right shoulder: Secondary | ICD-10-CM

## 2016-01-12 DIAGNOSIS — R293 Abnormal posture: Secondary | ICD-10-CM

## 2016-01-12 DIAGNOSIS — M6281 Muscle weakness (generalized): Secondary | ICD-10-CM

## 2016-01-12 NOTE — Therapy (Signed)
[place] MAIN [facility] SERVICES [address] [place], [place], [address] Phone: [phone]   Fax:  [phone]  Physical Therapy Treatment  Patient Details  Name: Anna Bates MRN: [id number] Date of Birth: 08-Apr-1966 Referring Provider: Dr. Onnie Graham  Encounter Date: 01/12/2016      PT End of Session - 01/12/16 0929    Visit Number 18   Number of Visits 24   Date for PT Re-Evaluation 01/22/16   Authorization Type worker's comp approved 12 visits   PT Start Time 0805   PT Stop Time 0845   PT Time Calculation (min) 40 min   Activity Tolerance Patient limited by pain;Patient tolerated treatment well   Behavior During Therapy [facility] for tasks assessed/performed      Past Medical History:  Diagnosis Date  . Anemia     Past Surgical History:  Procedure Laterality Date  . BREAST SURGERY  2002   Breast reduction -both  . CESAREAN SECTION  1990  . CHOLECYSTECTOMY  1991  . COLONOSCOPY WITH PROPOFOL N/A 12/21/2015   Procedure: COLONOSCOPY WITH PROPOFOL;  Surgeon: Lucilla Lame, MD;  Location: [place];  Service: Endoscopy;  Laterality: N/A;  . HERNIA REPAIR  1980  . REDUCTION MAMMAPLASTY Bilateral 2000  . right shoulder surgery Right 11/07/15   shoulder scope;   . SHOULDER SURGERY Right 05/09/2009    There were no vitals filed for this visit.      Subjective Assessment - 01/12/16 0808    Subjective Pt reports that R shoulder has felt ok since last session but that she notices an intermittent sharp pain which is something new.     Pertinent History Personal factors affecting rehab: every day smoker; chronic shoulder pain with multiple injuries;    How long can you sit comfortably? sleeping in a recliner;    How long can you stand comfortably? NA   How long can you walk comfortably? NA   Diagnostic tests no recent scans; goes back to see MD next Tuesday June 13th   Currently in Pain? Yes   Pain Score 2    Pain Location  Shoulder   Pain Orientation Right   Pain Descriptors / Indicators Burning;Sharp   Pain Onset More than a month ago       Treatment Prone central PAs grade 3 to T2-T9, 30 second bouts x 3 sets, pt is hypomobile but reports feeling some looser with thoracic mobs  Prone rows with 2lb weight, 3 sets x 10 reps, manual scapular upward rotation performed with first round,  Prone extensions with 1lb weight with direction to not go past 90 degrees of shoulder flexion, pt reported increase in pain after 10 reps so continuation deferred  Sidelying ER with no weight, 3 sets x 10 reps, min VCs to roll shoulders back and down before each set Seated thoracic extensions over chair, with lumbar roll support with  arms by side, 2 sets x 10 reps  Seated retractions with arms in slight external rotation, 3 sets x 10 reps, min VCs for upright posture and 3 second isometric hold                            PT Education - 01/12/16 0821    Education provided Yes   Education Details being careful with cooking, icing    Person(s) Educated Patient   Methods Explanation;Demonstration;Verbal cues   Comprehension Verbalized understanding;Returned demonstration;Verbal cues required  PT Long Term Goals - 12/25/15 1451      PT LONG TERM GOAL #1   Title Patient will be independent in home exercise program to improve strength/mobility for better functional independence with ADLs.    Baseline 44% disability 05/16/15   Time 4   Period Weeks   Status On-going     PT LONG TERM GOAL #2   Title  Patient will decrease Quick DASH score by > 8 points demonstrating reduced self-reported upper extremity disability.   Time 4   Period Weeks   Status New     PT LONG TERM GOAL #3   Title Patient will improve shoulder AROM to > 150 degrees of flexion, scaption, and abduction for improved ability to perform overhead activities.   Time 4   Period Weeks   Status Partially Met     PT  LONG TERM GOAL #4   Title Patient will report a worst pain of 3/10 on VAS in    right shoulder         to improve tolerance with ADLs and reduced symptoms with activities.    Time 4   Period Weeks   Status On-going     PT LONG TERM GOAL #5   Title Patient will improve RUE shoulder strength to >4/5 for improved strength with lifting and ADLs to return to PLOF.    Time 4   Period Weeks   Status Partially Met               Plan - 01/12/16 0939    Clinical Impression Statement Pt continues to report right shoulder pain at beginning of treatment at 2/10 that increased to 4/10 by end of session.  Pt reports that she has noticed intermittent bouts of sharp pain in R shoulder that is relatively new.  Her thoracic spine continues to be hypomobile with central PAs but does report some looseness after manual.  She reported increase in pain today with prone extensions using 1lb weight. Continue to defer flexion and abduction in seated or standing position due to increase pain with any ROM.  She would benefit from further skilled physical therapy to increase her scapular and RC strength as well as decrease pain relief for more functional mobility.     Rehab Potential Good   Clinical Impairments Affecting Rehab Potential positive indicator: young in age, motivated, also has help at home; Negative indicator: chronic pain, smoker; Patient's clinical presentation is stable as her pain is localized to right shoulder and does not vary much;    PT Frequency 3x / week   PT Duration 4 weeks   PT Treatment/Interventions ADLs/Self Care Home Management;Cryotherapy;Electrical Stimulation;Iontophoresis 70m/ml Dexamethasone;Moist Heat;Therapeutic exercise;Therapeutic activities;Functional mobility training;Ultrasound;Neuromuscular re-education;Patient/family education;Manual techniques;Taping;Energy conservation;Dry needling;Passive range of motion   PT Next Visit Plan continue thoracic mobs, no overhead activity or  flexion, seated ER on table    PT Home Exercise Plan not conitnued per MDs orders   Consulted and Agree with Plan of Care Patient      Patient will benefit from skilled therapeutic intervention in order to improve the following deficits and impairments:  Hypomobility, Decreased activity tolerance, Decreased strength, Pain, Impaired UE functional use, Decreased range of motion, Improper body mechanics, Postural dysfunction, Impaired flexibility  Visit Diagnosis: Pain in right shoulder  Muscle weakness (generalized)  Abnormal posture     Problem List Patient Active Problem List   Diagnosis Date Noted  . Special screening for malignant neoplasms, colon   . Thrombocytosis ([place]  12/06/2015  . Leukocytosis 12/06/2015  . Vitamin B12 deficiency anemia 12/01/2014  . Blood glucose elevated 12/01/2014  . Avitaminosis D 12/01/2014  . GU infection, trichomonal 12/01/2014  . Irregular bleeding 12/01/2014  . Anemia, iron deficiency 12/01/2014  . Essential (primary) hypertension 05/03/2009  . Depression, neurotic 02/03/2009   Stacy Gardner, SPT  This entire session was performed under direct supervision and direction of a licensed therapist/therapist assistant . I have personally read, edited and approve of the note as written.  Hillis Range, PT, DPT, [phone] 01/12/16 9:56 AM   [place] MAIN [facility] SERVICES [address] [place], [place], [address] Phone: [phone]   Fax:  [phone]  Name: Anna Bates MRN: [id number] Date of Birth: 02/09/1966

## 2016-01-15 ENCOUNTER — Encounter: Payer: Self-pay | Admitting: Physical Therapy

## 2016-01-15 ENCOUNTER — Ambulatory Visit: Payer: PRIVATE HEALTH INSURANCE | Admitting: Physical Therapy

## 2016-01-15 DIAGNOSIS — M6281 Muscle weakness (generalized): Secondary | ICD-10-CM

## 2016-01-15 DIAGNOSIS — M25511 Pain in right shoulder: Secondary | ICD-10-CM

## 2016-01-15 DIAGNOSIS — R293 Abnormal posture: Secondary | ICD-10-CM

## 2016-01-15 NOTE — Therapy (Signed)
[place] MAIN [facility] SERVICES [address] [place], [place], [address] Phone: [phone]   Fax:  [phone]  Physical Therapy Treatment  Patient Details  Name: Anna Bates MRN: [id number] Date of Birth: May 03, 1966 Referring Provider: Dr. Onnie Graham  Encounter Date: 01/15/2016      PT End of Session - 01/15/16 1033    Visit Number 19   Number of Visits 24   Date for PT Re-Evaluation 01/22/16   Authorization Type worker's comp approved 12 visits   PT Start Time 0805   PT Stop Time 0845   PT Time Calculation (min) 40 min   Activity Tolerance Patient limited by pain;Patient tolerated treatment well   Behavior During Therapy [facility] for tasks assessed/performed      Past Medical History:  Diagnosis Date  . Anemia     Past Surgical History:  Procedure Laterality Date  . BREAST SURGERY  2002   Breast reduction -both  . CESAREAN SECTION  1990  . CHOLECYSTECTOMY  1991  . COLONOSCOPY WITH PROPOFOL N/A 12/21/2015   Procedure: COLONOSCOPY WITH PROPOFOL;  Surgeon: Lucilla Lame, MD;  Location: [place];  Service: Endoscopy;  Laterality: N/A;  . HERNIA REPAIR  1980  . REDUCTION MAMMAPLASTY Bilateral 2000  . right shoulder surgery Right 11/07/15   shoulder scope;   . SHOULDER SURGERY Right 05/09/2009    There were no vitals filed for this visit.      Subjective Assessment - 01/15/16 0819    Subjective Pt reports that R shoulder was aggravated by shopping on saturday for her grandson's birthday party where she as doing lots of repetitive shoulder flexion and abduction.     Pertinent History Personal factors affecting rehab: every day smoker; chronic shoulder pain with multiple injuries;    How long can you sit comfortably? sleeping in a recliner;    How long can you stand comfortably? NA   How long can you walk comfortably? NA   Diagnostic tests no recent scans; goes back to see MD next Tuesday June 13th   Currently in Pain?  Yes   Pain Score 3    Pain Location Shoulder   Pain Orientation Right   Pain Descriptors / Indicators Burning;Sharp   Pain Type Chronic pain;Surgical pain   Pain Onset More than a month ago      Treatment Central PA's to T2-T8, 2 bouts x 30 seconds each, pt demonstrates less hypomobility in thoracic spine than previous sessions  Shoulder abduction with thumb out supine over half bolster, 2 sets x 10 reps, min VCs to stop movement at 90 degrees shoulder abduction  Shoulder flexion with bent elbow supine over half bolster, 2 sets x 10 reps, min VCs to stop at 90 degrees shoulder flexion  Sidelying ER deferred due to increase in pain  Seated shoulder ER with arm supported on table, min VCs for upright posture, 2 sets x 10 reps  Seated scapular retractions with 3 second isometric hold, 2 sets x 10 reps, min VCs for upright posture  Standing mid rows, 2 sets x 10 reps, 2lb weight, min VCs for slow eccentric control   Standing shoulder extensions with 1lb weight, 2 sets x 10 reps, min VCs to stop movement at neutral shoulder flexion                             PT Education - 01/15/16 0836    Education provided Yes  Education Details icing for preventative inflammation, bent elbow shoulder movements    Person(s) Educated Patient   Methods Explanation;Demonstration;Verbal cues   Comprehension Verbalized understanding;Returned demonstration;Verbal cues required             PT Long Term Goals - 12/25/15 1451      PT LONG TERM GOAL #1   Title Patient will be independent in home exercise program to improve strength/mobility for better functional independence with ADLs.    Baseline 44% disability 05/16/15   Time 4   Period Weeks   Status On-going     PT LONG TERM GOAL #2   Title  Patient will decrease Quick DASH score by > 8 points demonstrating reduced self-reported upper extremity disability.   Time 4   Period Weeks   Status New     PT LONG TERM GOAL #3    Title Patient will improve shoulder AROM to > 150 degrees of flexion, scaption, and abduction for improved ability to perform overhead activities.   Time 4   Period Weeks   Status Partially Met     PT LONG TERM GOAL #4   Title Patient will report a worst pain of 3/10 on VAS in    right shoulder         to improve tolerance with ADLs and reduced symptoms with activities.    Time 4   Period Weeks   Status On-going     PT LONG TERM GOAL #5   Title Patient will improve RUE shoulder strength to >4/5 for improved strength with lifting and ADLs to return to PLOF.    Time 4   Period Weeks   Status Partially Met               Plan - 01/15/16 1033    Clinical Impression Statement Pt reports shoulder aggravated over the weekend after grocery shopping without excessively using her RUE due to repetitive motion.  She continues to report increased pain with shoulder flexion, abduction, in supine, and shoulder ER in sidelying.  Her upper thoracic mobility seems to be less restricted than previous sessions.  Pt was able to perform short lever flexion and abduction in supine over half bolster on thoracic spine without much increase in R shoulder pain.  Continued to work on scapular strengthening with retractions, rows, and extensions.  She would continue to benefit from further skilled physical therapy to decrease pain, and improve scapular strength to improve her overall functional mobilty.     Rehab Potential Good   Clinical Impairments Affecting Rehab Potential positive indicator: young in age, motivated, also has help at home; Negative indicator: chronic pain, smoker; Patient's clinical presentation is stable as her pain is localized to right shoulder and does not vary much;    PT Frequency 3x / week   PT Duration 4 weeks   PT Treatment/Interventions ADLs/Self Care Home Management;Cryotherapy;Electrical Stimulation;Iontophoresis 35m/ml Dexamethasone;Moist Heat;Therapeutic exercise;Therapeutic  activities;Functional mobility training;Ultrasound;Neuromuscular re-education;Patient/family education;Manual techniques;Taping;Energy conservation;Dry needling;Passive range of motion   PT Next Visit Plan continue thoracic mobs, no overhead activity or flexion, seated ER on table    PT Home Exercise Plan not conitnued per MDs orders   Consulted and Agree with Plan of Care Patient      Patient will benefit from skilled therapeutic intervention in order to improve the following deficits and impairments:  Hypomobility, Decreased activity tolerance, Decreased strength, Pain, Impaired UE functional use, Decreased range of motion, Improper body mechanics, Postural dysfunction, Impaired flexibility  Visit Diagnosis: Pain  in right shoulder  Abnormal posture  Muscle weakness (generalized)     Problem List Patient Active Problem List   Diagnosis Date Noted  . Special screening for malignant neoplasms, colon   . Thrombocytosis ([place]) 12/06/2015  . Leukocytosis 12/06/2015  . Vitamin B12 deficiency anemia 12/01/2014  . Blood glucose elevated 12/01/2014  . Avitaminosis D 12/01/2014  . GU infection, trichomonal 12/01/2014  . Irregular bleeding 12/01/2014  . Anemia, iron deficiency 12/01/2014  . Essential (primary) hypertension 05/03/2009  . Depression, neurotic 02/03/2009   Stacy Gardner, SPT  This entire session was performed under direct supervision and direction of a licensed therapist/therapist assistant . I have personally read, edited and approve of the note as written.  Trotter,Margaret PT, DPT 01/15/2016, 10:56 AM  [place] MAIN [facility] SERVICES [address] [place], [place], [address] Phone: [phone]   Fax:  [phone]  Name: Anna Bates MRN: [id number] Date of Birth: 1965/12/22

## 2016-01-17 ENCOUNTER — Ambulatory Visit: Payer: PRIVATE HEALTH INSURANCE | Admitting: Physical Therapy

## 2016-01-17 ENCOUNTER — Encounter: Payer: Self-pay | Admitting: Physical Therapy

## 2016-01-17 DIAGNOSIS — R293 Abnormal posture: Secondary | ICD-10-CM

## 2016-01-17 DIAGNOSIS — M25511 Pain in right shoulder: Secondary | ICD-10-CM

## 2016-01-17 DIAGNOSIS — M25611 Stiffness of right shoulder, not elsewhere classified: Secondary | ICD-10-CM

## 2016-01-17 DIAGNOSIS — M6281 Muscle weakness (generalized): Secondary | ICD-10-CM

## 2016-01-17 NOTE — Therapy (Signed)
[place] MAIN [facility] SERVICES [address] [place], [place], [address] Phone: [phone]   Fax:  [phone]  Physical Therapy Treatment/Progress Note  Patient Details  Name: Anna Bates MRN: [id number] Date of Birth: 09/06/1965 Referring Provider: Dr. Onnie Graham  Encounter Date: 01/17/2016      PT End of Session - 01/17/16 0828    Visit Number 20   Number of Visits 24   Date for PT Re-Evaluation 01/22/16   Authorization Type worker's comp approved 12 visits   PT Start Time 0806   PT Stop Time 0845   PT Time Calculation (min) 39 min   Activity Tolerance Patient limited by pain;Patient tolerated treatment well   Behavior During Therapy [facility] for tasks assessed/performed      Past Medical History:  Diagnosis Date  . Anemia     Past Surgical History:  Procedure Laterality Date  . BREAST SURGERY  2002   Breast reduction -both  . CESAREAN SECTION  1990  . CHOLECYSTECTOMY  1991  . COLONOSCOPY WITH PROPOFOL N/A 12/21/2015   Procedure: COLONOSCOPY WITH PROPOFOL;  Surgeon: Lucilla Lame, MD;  Location: [place];  Service: Endoscopy;  Laterality: N/A;  . HERNIA REPAIR  1980  . REDUCTION MAMMAPLASTY Bilateral 2000  . right shoulder surgery Right 11/07/15   shoulder scope;   . SHOULDER SURGERY Right 05/09/2009    There were no vitals filed for this visit.      Subjective Assessment - 01/17/16 0812    Subjective Pt reports that R shoulder pain today is 2/10 this morning.  She reports that it was aggravated after therapy on 01/15/16 but calms down after several hours.     Pertinent History Personal factors affecting rehab: every day smoker; chronic shoulder pain with multiple injuries;    How long can you sit comfortably? sleeping in a recliner;    How long can you stand comfortably? NA   How long can you walk comfortably? NA   Diagnostic tests no recent scans; goes back to see MD next Tuesday June 13th   Currently in Pain?  Yes   Pain Score 2    Pain Location Shoulder   Pain Orientation Right   Pain Descriptors / Indicators Burning   Pain Onset More than a month ago            [facility] PT Assessment - 01/17/16 0001      Observation/Other Assessments   Quick DASH  43.18, lower the score indicates less disability      AROM   Right Shoulder Extension 65 Degrees   Right Shoulder Flexion 140 Degrees  painful coming down back to neutral    Right Shoulder ABduction 115 Degrees  painful going up and coming back down    Right Shoulder Internal Rotation 85 Degrees  increases pain    Right Shoulder External Rotation 65 Degrees   Left Shoulder Flexion 175 Degrees   Left Shoulder ABduction 172 Degrees     Strength   Right Shoulder Flexion 3+/5   Right Shoulder Extension 4-/5   Right Shoulder ABduction 3+/5   Right Shoulder Internal Rotation 4-/5   Right Shoulder External Rotation 4-/5      Treatment Remeasured R shoulder flexion, ER, IR, extension and abduction for progress, pain increased from 2/10 to 6/10 after shoulder flexion and abduction measurement  Prone PA mobs to C7-T7 to decrease thoracic hypomobility and provide some  pain relief after taking AROM measurements, 3 bouts x 20 seconds  each segment grade IV, some pain relief noted with central PAs Supine abduction lying over half bolster at inferior border of scapula, min VCs to keep thumb positioned out and demonstrate slow eccentric control, 2 sets x 10 reps, pt required several rest breaks due to increase in pain Sidelying ER without weight, 3 sets x 10 reps, min VCs for starting position, pt reports no increase in pain with sidleying ER  Attempted to assess shoulder flexion strength (3+/5) limited due to pain, extension (4-/5) and abduction (3+/5) limited due to pain  Quick DASH administered with score of 43.18% indicating increased percieved disability compared to previous progress check                        PT Education -  01/17/16 0828    Education provided Yes   Education Details icing for prevenetative inflammation, difficulty strengthening with pain    Person(s) Educated Patient   Methods Explanation;Demonstration;Verbal cues   Comprehension Returned demonstration;Verbal cues required;Verbalized understanding             PT Long Term Goals - 01/17/16 0844      PT LONG TERM GOAL #1   Title Patient will be independent in home exercise program to improve strength/mobility for better functional independence with ADLs.    Time 4   Period Weeks   Status On-going     PT LONG TERM GOAL #2   Title  Patient will decrease Quick DASH score by > 8 points demonstrating reduced self-reported upper extremity disability.   Baseline 43.18% percieved disability    Time 4   Period Weeks   Status Not Met     PT LONG TERM GOAL #3   Title Patient will improve shoulder AROM to > 150 degrees of flexion, scaption, and abduction for improved ability to perform overhead activities.   Baseline 140 degrees flexion, 115 degrees abduction    Time 4   Period Weeks   Status Partially Met     PT LONG TERM GOAL #4   Title Patient will report a worst pain of 3/10 on VAS in    right shoulder         to improve tolerance with ADLs and reduced symptoms with activities.    Baseline Worst pain reported 01/17/16 of 7/10   Time 4   Period Weeks   Status On-going     PT LONG TERM GOAL #5   Title Patient will improve RUE shoulder strength to >4/5 for improved strength with lifting and ADLs to return to PLOF.    Baseline 3+/5 grossly R shoulder on 01/17/16    Time 4   Period Weeks   Status Partially Met               Plan - 01/17/16 0839    Clinical Impression Statement Pt continues to be pain limited with shoulder flexion, abduction, extension and internal rotation.  Since previous progress note pt reports worst shoulder pain of 6-7/10 with any shoulder flexion and abduction.  Patient reported to therapy today with 2/10  pain which was aggravated with active shoulder flexion and abduction to 6/10.  Active shoulder flexion has remained about 140 degrees, abduction AROM has decreased to 115 due to pain, and internal rotation of 65 degrees due to increased pain.  Strength testing was attempted and pt has not made signficant progress since previous check.  She continues to grossly demonstrate 3+/5 for all shoulder strength with pain limiting.  She demonstrates an increase in self reported Quick DASH score indicating her percieved UE disability has increased.  Continued to strengthen scapular stabilizers and external rotators without much increase in pain. Limited shoulder flexion and abduction with gravity and gravity elimited positions due to increase in pain.  Continue to emphasis the importance of icing after therapy and throughout the day to decrease potential inflammation.  Pt is limited in her ability to increase shoulder girdle strength in physical therapy due to excessive pain with all flexion and abduction.       Rehab Potential Good   Clinical Impairments Affecting Rehab Potential positive indicator: young in age, motivated, also has help at home; Negative indicator: chronic pain, smoker; Patient's clinical presentation is stable as her pain is localized to right shoulder and does not vary much;    PT Frequency 3x / week   PT Duration 4 weeks   PT Treatment/Interventions ADLs/Self Care Home Management;Cryotherapy;Electrical Stimulation;Iontophoresis 84m/ml Dexamethasone;Moist Heat;Therapeutic exercise;Therapeutic activities;Functional mobility training;Ultrasound;Neuromuscular re-education;Patient/family education;Manual techniques;Taping;Energy conservation;Dry needling;Passive range of motion   PT Next Visit Plan continue thoracic mobs, no overhead activity or flexion, seated ER on table    PT Home Exercise Plan not conitnued per MDs orders   Consulted and Agree with Plan of Care Patient      Patient will benefit  from skilled therapeutic intervention in order to improve the following deficits and impairments:  Hypomobility, Decreased activity tolerance, Decreased strength, Pain, Impaired UE functional use, Decreased range of motion, Improper body mechanics, Postural dysfunction, Impaired flexibility  Visit Diagnosis: Pain in right shoulder  Abnormal posture  Muscle weakness (generalized)  Stiffness of right shoulder, not elsewhere classified     Problem List Patient Active Problem List   Diagnosis Date Noted  . Special screening for malignant neoplasms, colon   . [place]rombocytosis ([place] 12/06/2015  . Leukocytosis 12/06/2015  . Vitamin B12 deficiency anemia 12/01/2014  . Blood glucose elevated 12/01/2014  . Avitaminosis D 12/01/2014  . GU infection, trichomonal 12/01/2014  . Irregular bleeding 12/01/2014  . Anemia, iron deficiency 12/01/2014  . Essential (primary) hypertension 05/03/2009  . Depression, neurotic 02/03/2009   TStacy Gardner SPT  This entire session was performed under direct supervision and direction of a licensed therapist/therapist assistant . I have personally read, edited and approve of the note as written.  Trotter,Margaret PT, DPT 01/17/2016, 8:53 AM  [facility]SERV[facility] [address] [address]Phone: [phone]  Fax:  [phone] Name: Anna KAMAKAMRN: [id number] of Birth: 202/28/1967

## 2016-01-17 NOTE — Patient Instructions (Signed)
Remeasured R shoulder flexion, ER, IR, extension and abduction for progress, pain increased from 2/10 to 6/10 after shoulder flexion and abduction measurement  Prone PA mobs to C7-T7 to decrease thoracic hypomobility and allow for pain relief, 3 bouts x 20 seconds each segment grade IV, some pain relief noted with central PAs Supine abduction lying over half bolster at inferior border of scapula, min VCs to keep thumb positioned out and demonstrate slow eccentric control, 2 sets x 10 reps Sidelying ER without weight, 3 sets x 10 reps, min VCs for starting position, pt reports no increase in pain with sidleying ER  Attempted to assess shoulder flexion strength (3+/5) limited due to pain, extension (4-/5) and abduction (3+/5) limited due to pain  Quick DASH administered with score of 43.18% indicating increased percieved disability compared to previous progress check

## 2016-01-18 DIAGNOSIS — N920 Excessive and frequent menstruation with regular cycle: Secondary | ICD-10-CM | POA: Diagnosis not present

## 2016-01-19 ENCOUNTER — Ambulatory Visit: Payer: PRIVATE HEALTH INSURANCE | Admitting: Physical Therapy

## 2016-01-22 ENCOUNTER — Ambulatory Visit: Payer: PRIVATE HEALTH INSURANCE | Admitting: Physical Therapy

## 2016-01-22 ENCOUNTER — Encounter: Payer: Self-pay | Admitting: Physical Therapy

## 2016-01-22 ENCOUNTER — Encounter: Payer: PRIVATE HEALTH INSURANCE | Admitting: Physical Therapy

## 2016-01-22 DIAGNOSIS — M25511 Pain in right shoulder: Secondary | ICD-10-CM

## 2016-01-22 DIAGNOSIS — M25611 Stiffness of right shoulder, not elsewhere classified: Secondary | ICD-10-CM

## 2016-01-22 DIAGNOSIS — M6281 Muscle weakness (generalized): Secondary | ICD-10-CM

## 2016-01-22 DIAGNOSIS — R293 Abnormal posture: Secondary | ICD-10-CM

## 2016-01-22 NOTE — Therapy (Signed)
[place] [facility] [address] [place], [address] Phone: [phone]   Fax:  [phone]  Physical Therapy Treatment  Patient Details  Name: Anna Bates MRN: [id number] Date of Birth: 11-06-1965 Referring Provider: Dr. Onnie Graham  Encounter Date: 01/22/2016      PT End of Session - 01/22/16 1010    Visit Number 21   Number of Visits 24   Date for PT Re-Evaluation 01/22/16   Authorization Type worker's comp approved 12 visits   PT Start Time 0945   PT Stop Time 1023   PT Time Calculation (min) 38 min   Activity Tolerance Patient limited by pain;Patient tolerated treatment well   Behavior During Therapy [facility] for tasks assessed/performed      Past Medical History:  Diagnosis Date  . Anemia     Past Surgical History:  Procedure Laterality Date  . BREAST SURGERY  2002   Breast reduction -both  . CESAREAN SECTION  1990  . CHOLECYSTECTOMY  1991  . COLONOSCOPY WITH PROPOFOL N/A 12/21/2015   Procedure: COLONOSCOPY WITH PROPOFOL;  Surgeon: Lucilla Lame, MD;  Location: [place];  Service: Endoscopy;  Laterality: N/A;  . HERNIA REPAIR  1980  . REDUCTION MAMMAPLASTY Bilateral 2000  . right shoulder surgery Right 11/07/15   shoulder scope;   . SHOULDER SURGERY Right 05/09/2009    There were no vitals filed for this visit.      Subjective Assessment - 01/22/16 1008    Subjective Pt reports she is doing fair. Continues to have pain but wants to push herself to get better.    Pertinent History Personal factors affecting rehab: every day smoker; chronic shoulder pain with multiple injuries;    How long can you sit comfortably? sleeping in a recliner;    How long can you stand comfortably? NA   How long can you walk comfortably? NA   Diagnostic tests no recent scans; goes back to see MD next Tuesday June 13th   Currently in Pain? Yes   Pain Score 2   R shoulder   Pain Descriptors / Indicators  Aching   Pain Type Chronic pain;Surgical pain   Pain Onset More than a month ago      UBE x5 minutes for shoulder strengthening and ROM, cues for postural correction and decreased shoulder shrug Standing mid rows/scapular retraction with YTB 2x10  Low rows with YTB 2x10 R UE Flexion to ~70 deg with YTB 2x10 R UE ER and IR with towel roll with YTB 2x10 Ball on wall at 90 deg: CW, CCW, Up/down 2x20 with scapular retraction   Cues for proper technique of exercises to target specific muscles, scapular retraction, decreased shoulder shrug and slow eccentric contractions for most effective strengthening.                            PT Education - 01/22/16 1009    Education provided Yes   Education Details decreased shoulder shrug, scapular retraction, continue HEP and icing   Person(s) Educated Patient   Methods Explanation;Demonstration   Comprehension Verbalized understanding;Returned demonstration             PT Long Term Goals - 01/17/16 0844      PT LONG TERM GOAL #1   Title Patient will be independent in home exercise program to improve strength/mobility for better functional independence with ADLs.    Time 4   Period Weeks  Status On-going     PT LONG TERM GOAL #2   Title  Patient will decrease Quick DASH score by > 8 points demonstrating reduced self-reported upper extremity disability.   Baseline 43.18% percieved disability    Time 4   Period Weeks   Status Not Met     PT LONG TERM GOAL #3   Title Patient will improve shoulder AROM to > 150 degrees of flexion, scaption, and abduction for improved ability to perform overhead activities.   Baseline 140 degrees flexion, 115 degrees abduction    Time 4   Period Weeks   Status Partially Met     PT LONG TERM GOAL #4   Title Patient will report a worst pain of 3/10 on VAS in    right shoulder         to improve tolerance with ADLs and reduced symptoms with activities.    Baseline Worst pain  reported 01/17/16 of 7/10   Time 4   Period Weeks   Status On-going     PT LONG TERM GOAL #5   Title Patient will improve RUE shoulder strength to >4/5 for improved strength with lifting and ADLs to return to PLOF.    Baseline 3+/5 grossly R shoulder on 01/17/16    Time 4   Period Weeks   Status Partially Met               Plan - 01/22/16 1050    Clinical Impression Statement Pt arrived 15 minutes late. She was able to perform UE strengthening exercises well with minimal increase in pain. As muscles fatigues, she required occasional therapeutic rest breaks for energy conservation. Mod verbal and tactile cues given for decreased shoulder shrug, scapular retraction and proper technique of exercsies. She will benefit from continued strength and ROM exercises to improve functional use of L UE and ability to perform ADLs.   Rehab Potential Good   Clinical Impairments Affecting Rehab Potential positive indicator: young in age, motivated, also has help at home; Negative indicator: chronic pain, smoker; Patient's clinical presentation is stable as her pain is localized to right shoulder and does not vary much;    PT Frequency 3x / week   PT Duration 4 weeks   PT Treatment/Interventions ADLs/Self Care Home Management;Cryotherapy;Electrical Stimulation;Iontophoresis 33m/ml Dexamethasone;Moist Heat;Therapeutic exercise;Therapeutic activities;Functional mobility training;Ultrasound;Neuromuscular re-education;Patient/family education;Manual techniques;Taping;Energy conservation;Dry needling;Passive range of motion   PT Next Visit Plan continue thoracic mobs, no overhead activity or flexion, seated ER on table    PT Home Exercise Plan not conitnued per MDs orders   Consulted and Agree with Plan of Care Patient      Patient will benefit from skilled therapeutic intervention in order to improve the following deficits and impairments:  Hypomobility, Decreased activity tolerance, Decreased strength,  Pain, Impaired UE functional use, Decreased range of motion, Improper body mechanics, Postural dysfunction, Impaired flexibility  Visit Diagnosis: Pain in right shoulder  Abnormal posture  Muscle weakness (generalized)  Stiffness of right shoulder, not elsewhere classified  Pain in joint of right shoulder     Problem List Patient Active Problem List   Diagnosis Date Noted  . Special screening for malignant neoplasms, colon   . Thrombocytosis ([place] 12/06/2015  . Leukocytosis 12/06/2015  . Vitamin B12 deficiency anemia 12/01/2014  . Blood glucose elevated 12/01/2014  . Avitaminosis D 12/01/2014  . GU infection, trichomonal 12/01/2014  . Irregular bleeding 12/01/2014  . Anemia, iron deficiency 12/01/2014  . Essential (primary) hypertension 05/03/2009  . Depression, neurotic  02/03/2009    Ethelbert Thain Shiela Mayer, PT, DPT  01/22/16, 11:08 AM [place] [facility] [address] [place], [address] Phone: [phone]   Fax:  [phone]  Name: Anna Bates MRN: [id number] Date of Birth: [birth year]

## 2016-01-23 ENCOUNTER — Inpatient Hospital Stay: Payer: 59 | Attending: Hematology and Oncology | Admitting: Hematology and Oncology

## 2016-01-23 ENCOUNTER — Inpatient Hospital Stay: Payer: 59

## 2016-01-23 ENCOUNTER — Encounter: Payer: Self-pay | Admitting: Hematology and Oncology

## 2016-01-23 ENCOUNTER — Other Ambulatory Visit: Payer: Self-pay | Admitting: *Deleted

## 2016-01-23 VITALS — BP 137/97 | HR 114 | Temp 97.0°F | Resp 18 | Wt 240.3 lb

## 2016-01-23 DIAGNOSIS — K5730 Diverticulosis of large intestine without perforation or abscess without bleeding: Secondary | ICD-10-CM | POA: Insufficient documentation

## 2016-01-23 DIAGNOSIS — K648 Other hemorrhoids: Secondary | ICD-10-CM | POA: Diagnosis not present

## 2016-01-23 DIAGNOSIS — D500 Iron deficiency anemia secondary to blood loss (chronic): Secondary | ICD-10-CM

## 2016-01-23 DIAGNOSIS — F17210 Nicotine dependence, cigarettes, uncomplicated: Secondary | ICD-10-CM | POA: Diagnosis not present

## 2016-01-23 DIAGNOSIS — D75839 Thrombocytosis, unspecified: Secondary | ICD-10-CM

## 2016-01-23 DIAGNOSIS — D509 Iron deficiency anemia, unspecified: Secondary | ICD-10-CM

## 2016-01-23 DIAGNOSIS — Z79899 Other long term (current) drug therapy: Secondary | ICD-10-CM | POA: Insufficient documentation

## 2016-01-23 DIAGNOSIS — D473 Essential (hemorrhagic) thrombocythemia: Secondary | ICD-10-CM | POA: Diagnosis not present

## 2016-01-23 DIAGNOSIS — N920 Excessive and frequent menstruation with regular cycle: Secondary | ICD-10-CM | POA: Insufficient documentation

## 2016-01-23 DIAGNOSIS — D519 Vitamin B12 deficiency anemia, unspecified: Secondary | ICD-10-CM

## 2016-01-23 DIAGNOSIS — E538 Deficiency of other specified B group vitamins: Secondary | ICD-10-CM | POA: Diagnosis not present

## 2016-01-23 LAB — CBC WITH DIFFERENTIAL/PLATELET
Basophils Absolute: 0.2 10*3/uL — ABNORMAL HIGH (ref 0–0.1)
Basophils Relative: 1 %
Eosinophils Absolute: 0.3 10*3/uL (ref 0–0.7)
Eosinophils Relative: 3 %
HCT: 36.3 % (ref 35.0–47.0)
Hemoglobin: 11.8 g/dL — ABNORMAL LOW (ref 12.0–16.0)
Lymphocytes Relative: 27 %
Lymphs Abs: 3.3 10*3/uL (ref 1.0–3.6)
MCH: 24.8 pg — ABNORMAL LOW (ref 26.0–34.0)
MCHC: 32.4 g/dL (ref 32.0–36.0)
MCV: 76.4 fL — ABNORMAL LOW (ref 80.0–100.0)
Monocytes Absolute: 0.9 10*3/uL (ref 0.2–0.9)
Monocytes Relative: 7 %
Neutro Abs: 7.7 10*3/uL — ABNORMAL HIGH (ref 1.4–6.5)
Neutrophils Relative %: 62 %
Platelets: 904 10*3/uL (ref 150–440)
RBC: 4.75 MIL/uL (ref 3.80–5.20)
RDW: 17.8 % — ABNORMAL HIGH (ref 11.5–14.5)
WBC: 12.4 10*3/uL — ABNORMAL HIGH (ref 3.6–11.0)

## 2016-01-23 LAB — IRON AND TIBC
Iron: 27 ug/dL — ABNORMAL LOW (ref 28–170)
Saturation Ratios: 5 % — ABNORMAL LOW (ref 10.4–31.8)
TIBC: 529 ug/dL — ABNORMAL HIGH (ref 250–450)
UIBC: 502 ug/dL

## 2016-01-23 LAB — FERRITIN: Ferritin: 5 ng/mL — ABNORMAL LOW (ref 11–307)

## 2016-01-23 LAB — VITAMIN B12: Vitamin B-12: 436 pg/mL (ref 180–914)

## 2016-01-23 NOTE — Progress Notes (Signed)
Patient is here for a follow up, she is doing well no complaints

## 2016-01-23 NOTE — Progress Notes (Signed)
[facility] day:  01/23/16  Chief Complaint: Anna Bates is a 50 y.o. female with iron deficiency anemia and reactive thrombocytosis who is seen for review of initial testing and 1 month assessment on oral iron.  HPI:  The patient was last seen in the hematology clinic on 12/26/2015.  She had heavy menses causing iron deficiency anemia.  She was referred to gynecology.  We discussed oral and IV iron.  She had recently restarted oral B12 for documented B12 deficiency.  Labs on 12/26/2015 revealed a hematocrit of 34.5, hemoglobin 11.1, MCV 76, platelets 923,000, WBC 14,200 with an ANC of 9200.  Ferritin was 4.  Retic was 2.1%.  Folate was 7.3.  She states that she was seen by gynecology.  She was felt to have fibroids. Ultrasound was negative.  She had a biopsy. Options were discussed with the patient.  Symptomatically, she denies any melena or hematochezia.  She notes outpatient rehab for her right shoulder.   Past Medical History:  Diagnosis Date  . Anemia     Past Surgical History:  Procedure Laterality Date  . BREAST SURGERY  2002   Breast reduction -both  . CESAREAN SECTION  1990  . CHOLECYSTECTOMY  1991  . COLONOSCOPY WITH PROPOFOL N/A 12/21/2015   Procedure: COLONOSCOPY WITH PROPOFOL;  Surgeon: Lucilla Lame, MD;  Location: [place];  Service: Endoscopy;  Laterality: N/A;  . HERNIA REPAIR  1980  . REDUCTION MAMMAPLASTY Bilateral 2000  . right shoulder surgery Right 11/07/15   shoulder scope;   . SHOULDER SURGERY Right 05/09/2009    Family History  Problem Relation Age of Onset  . Hypertension Other   . Diabetes Other     Diabetes Mellitus type 2  . Diabetes Mother     Social History:  reports that she has been smoking.  She has a 17.50 pack-year smoking history. She does not have any smokeless tobacco history on file. She reports that she drinks about 1.2 oz of alcohol per week . She reports that she does not  use drugs.  She works as a Corporate treasurer.  She has 4 children.  Her daughter has heavy menses.  The patient is alone today.  Allergies:  Allergies  Allergen Reactions  . Scallops [Shellfish Allergy] Nausea And Vomiting    Current Medications: Current Outpatient Prescriptions  Medication Sig Dispense Refill  . acetaminophen (TYLENOL) 500 MG tablet Take 500 mg by mouth every 6 (six) hours as needed.    . cyanocobalamin 2000 MCG tablet Take 2,000 mcg by mouth daily.    . Vitamin D, Ergocalciferol, (DRISDOL) 50000 units CAPS capsule Take 1 capsule (50,000 Units total) by mouth every 7 (seven) days. 12 capsule 1  . diazepam (VALIUM) 5 MG tablet Take 5 mg by mouth every 6 (six) hours as needed for anxiety (1/2- 1 tab).     No current facility-administered medications for this visit.     Review of Systems:  GENERAL:  Feels "ok".  No fevers or sweats.  Weight loss of 7 pounds. PERFORMANCE STATUS (ECOG):  0 HEENT:  No visual changes, runny nose, sore throat, mouth sores or tenderness. Lungs: No shortness of breath or cough.  No hemoptysis. Cardiac:  No chest pain, palpitations, orthopnea, or PND. GI:  No nausea, vomiting, diarrhea, constipation, melena or hematochezia. GU:  Heavy menses (seeing gynecologyI).  No urgency, frequency, dysuria, or hematuria. Musculoskeletal:  No back pain.  Right shoulder rehab.  No muscle  tenderness. Extremities:  No pain or swelling. Skin:  No rashes or skin changes. Neuro:  No headache, numbness or weakness, balance or coordination issues. Endocrine:  No diabetes, thyroid issues, hot flashes or night sweats. Psych:  No mood changes, depression or anxiety. Pain:  No focal pain. Review of systems:  All other systems reviewed and found to be negative.  Physical Exam: Blood pressure (!) 137/97, pulse (!) 114, temperature 97 F (36.1 C), temperature source Tympanic, resp. rate 18, weight 240 lb 4.8 oz (109 kg). GENERAL:  Well developed, well nourished, woman sitting  comfortably in the exam room in no acute distress. MENTAL STATUS:  Alert and oriented to person, place and time. HEAD:  Brown hair with slight graying.  Normocephalic, atraumatic, face symmetric, no Cushingoid features. EYES:  Glasses.  Brown eyes.  Pupils equal round and reactive to light and accomodation.  No conjunctivitis or scleral icterus. ENT:  Oropharynx clear without lesion.  Tongue normal. Mucous membranes moist.  RESPIRATORY:  Clear to auscultation without rales, wheezes or rhonchi. CARDIOVASCULAR:  Regular rate and rhythm without murmur, rub or gallop. ABDOMEN:  Soft, non-tender, with active bowel sounds, and no appreciable hepatosplenomegaly.  No masses. SKIN:  No rashes, ulcers or lesions. EXTREMITIES: No edema, no skin discoloration or tenderness.  No palpable cords. LYMPH NODES: No palpable cervical, supraclavicular, axillary or inguinal adenopathy  NEUROLOGICAL: Unremarkable. PSYCH:  Appropriate.   Appointment on 01/23/2016  Component Date Value Ref Range Status  . WBC 01/23/2016 12.4* 3.6 - 11.0 K/uL Final  . RBC 01/23/2016 4.75  3.80 - 5.20 MIL/uL Final  . Hemoglobin 01/23/2016 11.8* 12.0 - 16.0 g/dL Final  . HCT 01/23/2016 36.3  35.0 - 47.0 % Final  . MCV 01/23/2016 76.4* 80.0 - 100.0 fL Final  . MCH 01/23/2016 24.8* 26.0 - 34.0 pg Final  . MCHC 01/23/2016 32.4  32.0 - 36.0 g/dL Final  . RDW 01/23/2016 17.8* 11.5 - 14.5 % Final  . Platelets 01/23/2016 904* 150 - 440 K/uL Final   Comment: CRITICAL RESULT CALLED TO, READ BACK BY AND VERIFIED WITH: CALLED BRITTANY PEARCE@1024  ON 01/23/16 BY HKP   . Neutrophils Relative % 01/23/2016 62%  % Final  . Neutro Abs 01/23/2016 7.7* 1.4 - 6.5 K/uL Final  . Lymphocytes Relative 01/23/2016 27%  % Final  . Lymphs Abs 01/23/2016 3.3  1.0 - 3.6 K/uL Final  . Monocytes Relative 01/23/2016 7%  % Final  . Monocytes Absolute 01/23/2016 0.9  0.2 - 0.9 K/uL Final  . Eosinophils Relative 01/23/2016 3%  % Final  . Eosinophils Absolute  01/23/2016 0.3  0 - 0.7 K/uL Final  . Basophils Relative 01/23/2016 1%  % Final  . Basophils Absolute 01/23/2016 0.2* 0 - 0.1 K/uL Final    Assessment:  Anna Bates is a 50 y.o. female with iron deficiency anemia and reactive thrombocytosis.  Platelet count has ranged between 435,000 - 640,000.  Platelet count was 985,000 on 12/05/2015.  JAK2 and BCR-ABL were negative in 04/2009.  She has heavy menses.  She is being evaluated by gynecology.  Colonoscopy on 12/21/2015 revealed diverticulosis in the entire colon and non-bleeding internal hemorrhoids.    Diet is modest.  She has ice pica.  She has B12 deficiency initially documented in 2007.  She was previously on B12 injections (stopped on 04/26/2009).  B12 was 159 (low) on 12/05/2015.  She started oral B12 on 12/08/2015.  Symptomatically, she denies any complaint.  Exam is unremarkable.  Hematocrit is 36.3.  Ferritin is 5.  Plan: 1.  Labs today:  CBC with diff, ferritin, iron studies, B12. 2.  Continue oral iron.  Discuss IV iron if unable to replete iron stores.  Ferritin goal is 100. 3.  Follow-up as scheduled with gynecology. 4.  RTC in 1 month for labs (CBC with diff, ferritin, von Willeband panel) 5.  RTC in 2 months for MD assessment and labs (CBC with diff, ferritin) +/- Venofer   Lequita Asal, MD  01/23/2016, 10:33 AM

## 2016-01-24 ENCOUNTER — Encounter: Payer: PRIVATE HEALTH INSURANCE | Admitting: Physical Therapy

## 2016-01-24 ENCOUNTER — Telehealth: Payer: Self-pay | Admitting: *Deleted

## 2016-01-24 NOTE — Telephone Encounter (Signed)
Pt notified of results and MD recommendations. Pt instructed to continue taking b12. Pt verbalized understanding.

## 2016-01-24 NOTE — Telephone Encounter (Signed)
-----   Message from Lequita Asal, MD sent at 01/24/2016  9:10 AM EDT ----- Regarding: Please call patient  B12 level good on oral B12.  M  ----- Message ----- From: Interface, Lab In [place] Sent: 01/23/2016  10:25 AM To: Lequita Asal, MD

## 2016-01-25 ENCOUNTER — Ambulatory Visit: Payer: PRIVATE HEALTH INSURANCE | Admitting: Physical Therapy

## 2016-01-25 DIAGNOSIS — M25611 Stiffness of right shoulder, not elsewhere classified: Secondary | ICD-10-CM

## 2016-01-25 DIAGNOSIS — M25511 Pain in right shoulder: Secondary | ICD-10-CM | POA: Diagnosis not present

## 2016-01-25 NOTE — Therapy (Addendum)
[place] [facility] [address], [place], [address] Phone: [phone]   Fax:  [phone]  Physical Therapy Treatment  Patient Details  Name: Anna Bates MRN: [id number] Date of Birth: 06-26-1965 Referring Provider: Dr. Onnie Graham  Encounter Date: 01/25/2016      PT End of Session - 01/25/16 1626    Visit Number 22   Number of Visits 30   Date for PT Re-Evaluation 02/22/16   PT Start Time 1430   PT Stop Time 1509   PT Time Calculation (min) 39 min   Activity Tolerance Patient tolerated treatment well   Behavior During Therapy [facility] for tasks assessed/performed      Past Medical History:  Diagnosis Date  . Anemia     Past Surgical History:  Procedure Laterality Date  . BREAST SURGERY  2002   Breast reduction -both  . CESAREAN SECTION  1990  . CHOLECYSTECTOMY  1991  . COLONOSCOPY WITH PROPOFOL N/A 12/21/2015   Procedure: COLONOSCOPY WITH PROPOFOL;  Surgeon: Lucilla Lame, MD;  Location: [place];  Service: Endoscopy;  Laterality: N/A;  . HERNIA REPAIR  1980  . REDUCTION MAMMAPLASTY Bilateral 2000  . right shoulder surgery Right 11/07/15   shoulder scope;   . SHOULDER SURGERY Right 05/09/2009    There were no vitals filed for this visit.      Subjective Assessment - 01/25/16 1630    Subjective Patient reports she has reached a plateau with therapy and wants to progress but felt like she needed a change of venue. She reports tx recently has been directed at her spine which has not been working for her. She is unable to complete recreational activities such as bowling or playing with grand-kids due to pain.    Pertinent History Personal factors affecting rehab: every day smoker; chronic shoulder pain with multiple injuries;    How long can you sit comfortably? sleeping in a recliner;    How long can you stand comfortably? NA   How long can you walk comfortably? NA   Diagnostic tests no  recent scans; goes back to see MD next Tuesday June 13th   Patient Stated Goals "To be able to have full shoulder ROM of RUE; be able to go back to work without limitations;  be able to swim, hang out with family;    Currently in Pain? Yes   Pain Score --  Mild resting pain, increases sharply at end ranges of movement   Pain Location Shoulder   Pain Orientation Right   Pain Descriptors / Indicators Aching   Pain Type Chronic pain;Surgical pain   Pain Onset More than a month ago   Pain Frequency Constant         AROM to 140-145 degrees  PROM ER - quite limited ~ 35-45 degrees before onset of pain.   PROM ER stretching x 3 bouts x 30" initially, painful with oscillations, performed STM which relieved further symptoms with 2 added bouts of ER   Seated OMEGA rows x 10# for 10, 8 repetitions (felt more pain on eccentric control of weight)  Gait assessed, patient has decreased R arm swing due to pain, provided half/full water bottle which provides biofeedback, patient reports she is aware of shoulder but more comfortable with completing.   Suitcase carry with 2# DB x 47m(not challenging) 2 bouts with 4# DB, reported challenging but able to complete.   Patient reported her shoulder was irritated again, provided STM  which reduced her resting pain, patient reported no need for ice after this session.                         PT Education - 01/25/16 1629    Education provided Yes   Education Details Will assess her response to tx in follow up session. Possibly start HEP after next session.    Person(s) Educated Patient   Methods Explanation   Comprehension Verbalized understanding             PT Long Term Goals - 01/17/16 0844      PT LONG TERM GOAL #1   Title Patient will be independent in home exercise program to improve strength/mobility for better functional independence with ADLs.    Time 4   Period Weeks   Status On-going     PT LONG TERM GOAL #2    Title  Patient will decrease Quick DASH score by > 8 points demonstrating reduced self-reported upper extremity disability.   Baseline 43.18% percieved disability    Time 4   Period Weeks   Status Not Met     PT LONG TERM GOAL #3   Title Patient will improve shoulder AROM to > 150 degrees of flexion, scaption, and abduction for improved ability to perform overhead activities.   Baseline 140 degrees flexion, 115 degrees abduction    Time 4   Period Weeks   Status Partially Met     PT LONG TERM GOAL #4   Title Patient will report a worst pain of 3/10 on VAS in    right shoulder         to improve tolerance with ADLs and reduced symptoms with activities.    Baseline Worst pain reported 01/17/16 of 7/10   Time 4   Period Weeks   Status On-going     PT LONG TERM GOAL #5   Title Patient will improve RUE shoulder strength to >4/5 for improved strength with lifting and ADLs to return to PLOF.    Baseline 3+/5 grossly R shoulder on 01/17/16    Time 4   Period Weeks   Status Partially Met               Plan - 01/25/16 1627    Clinical Impression Statement Patient responded well to soft tissue mobilization over tender areas, afterwards she reported less pain with active elevation and PROM to ER. She has significant deficit in ER PROM, likely limiting her ability to flex shoulder and contributing to resting pain. She is able to tolerate increased strengthening exercises, though requires additional bout of STM after to reduce pain.    Rehab Potential Good   Clinical Impairments Affecting Rehab Potential positive indicator: young in age, motivated, also has help at home; Negative indicator: chronic pain, smoker; Patient's clinical presentation is stable as her pain is localized to right shoulder and does not vary much;    PT Frequency 3x / week   PT Duration 4 weeks   PT Treatment/Interventions ADLs/Self Care Home Management;Cryotherapy;Electrical Stimulation;Iontophoresis '4mg'$ /ml  Dexamethasone;Moist Heat;Therapeutic exercise;Therapeutic activities;Functional mobility training;Ultrasound;Neuromuscular re-education;Patient/family education;Manual techniques;Taping;Energy conservation;Dry needling;Passive range of motion   PT Next Visit Plan continue thoracic mobs, no overhead activity or flexion, seated ER on table    PT Home Exercise Plan not conitnued per MDs orders   Consulted and Agree with Plan of Care Patient      Patient will benefit from skilled therapeutic intervention in order to improve  the following deficits and impairments:  Hypomobility, Decreased activity tolerance, Decreased strength, Pain, Impaired UE functional use, Decreased range of motion, Improper body mechanics, Postural dysfunction, Impaired flexibility  Visit Diagnosis: Pain in right shoulder - Plan: PT plan of care cert/re-cert  Stiffness of right shoulder, not elsewhere classified - Plan: PT plan of care cert/re-cert     Problem List Patient Active Problem List   Diagnosis Date Noted  . Menorrhagia with regular cycle 01/23/2016  . Special screening for malignant neoplasms, colon   . Thrombocytosis ([place]) 12/06/2015  . Leukocytosis 12/06/2015  . Vitamin B12 deficiency anemia 12/01/2014  . Blood glucose elevated 12/01/2014  . Avitaminosis D 12/01/2014  . GU infection, trichomonal 12/01/2014  . Irregular bleeding 12/01/2014  . Anemia, iron deficiency 12/01/2014  . Essential (primary) hypertension 05/03/2009  . Depression, neurotic 02/03/2009   Kerman Passey, PT, DPT    01/25/2016, 4:38 PM  [place] [facility] [address] [place], [address] Phone: [phone]   Fax:  [phone]  Name: Anna Bates MRN: [id number] Date of Birth: 1965-11-01   Addendum: added subjective  Kerman Passey, PT, DPT

## 2016-01-25 NOTE — Addendum Note (Signed)
Addended byRoyce Macadamia A on: 01/25/2016 04:39 PM   Modules accepted: Orders

## 2016-01-26 ENCOUNTER — Encounter: Payer: PRIVATE HEALTH INSURANCE | Admitting: Physical Therapy

## 2016-01-29 ENCOUNTER — Encounter: Payer: PRIVATE HEALTH INSURANCE | Admitting: Physical Therapy

## 2016-01-29 ENCOUNTER — Ambulatory Visit: Payer: PRIVATE HEALTH INSURANCE | Admitting: Physical Therapy

## 2016-01-29 DIAGNOSIS — M25611 Stiffness of right shoulder, not elsewhere classified: Secondary | ICD-10-CM

## 2016-01-29 DIAGNOSIS — M25511 Pain in right shoulder: Secondary | ICD-10-CM

## 2016-01-29 DIAGNOSIS — M6281 Muscle weakness (generalized): Secondary | ICD-10-CM

## 2016-01-29 NOTE — Therapy (Signed)
[place] [facility] [address] [place], [address] Phone: [phone]   Fax:  [phone]  Physical Therapy Treatment  Patient Details  Name: Anna Bates MRN: [id number] Date of Birth: 02/01/1966 Referring Provider: Dr. Onnie Graham  Encounter Date: 01/29/2016      PT End of Session - 01/29/16 0901    Visit Number 23   Number of Visits 30   Date for PT Re-Evaluation 02/22/16   PT Start Time 0800   PT Stop Time 0843   PT Time Calculation (min) 43 min   Activity Tolerance Patient tolerated treatment well   Behavior During Therapy [facility] for tasks assessed/performed      Past Medical History:  Diagnosis Date  . Anemia     Past Surgical History:  Procedure Laterality Date  . BREAST SURGERY  2002   Breast reduction -both  . CESAREAN SECTION  1990  . CHOLECYSTECTOMY  1991  . COLONOSCOPY WITH PROPOFOL N/A 12/21/2015   Procedure: COLONOSCOPY WITH PROPOFOL;  Surgeon: Lucilla Lame, MD;  Location: [place];  Service: Endoscopy;  Laterality: N/A;  . HERNIA REPAIR  1980  . REDUCTION MAMMAPLASTY Bilateral 2000  . right shoulder surgery Right 11/07/15   shoulder scope;   . SHOULDER SURGERY Right 05/09/2009    There were no vitals filed for this visit.      Subjective Assessment - 01/29/16 0803    Subjective Patient reports last Friday she had intense pain throughout the day, she reports on Saturday and Sunday this eased back to close to baseline. She reports that she expected this.    Pertinent History Personal factors affecting rehab: every day smoker; chronic shoulder pain with multiple injuries;    How long can you sit comfortably? sleeping in a recliner;    How long can you stand comfortably? NA   How long can you walk comfortably? NA   Diagnostic tests no recent scans; goes back to see MD next Tuesday June 13th   Patient Stated Goals "To be able to have full shoulder ROM of RUE; be able to go back  to work without limitations;  be able to swim, hang out with family;    Currently in Pain? Yes   Pain Score --  Reports she is back to her baseline pain level.    Pain Onset More than a month ago        AROM to 155 degrees (didn't feel bad)    PROM to ER ROM 5 bouts x 30"   Soft tissue mobilization for pain control afterwards (reduced to resting 2/10)   AROM to 168 degrees ROM after soft tissue mobilization.   Low rows with green t-band x 10 for 2 sets (no pain)   Single arm cable pulldowns -- 2 sets x 10, x 8 repetitions at 5#.   AROM to 158-160 degrees after session                          PT Education - 01/29/16 0900    Education provided Yes   Education Details WIll contact MD about HEP, increase in ROM.    Person(s) Educated Patient   Methods Explanation   Comprehension Verbalized understanding             PT Long Term Goals - 01/17/16 0844      PT LONG TERM GOAL #1   Title Patient will be independent in home exercise  program to improve strength/mobility for better functional independence with ADLs.    Time 4   Period Weeks   Status On-going     PT LONG TERM GOAL #2   Title  Patient will decrease Quick DASH score by > 8 points demonstrating reduced self-reported upper extremity disability.   Baseline 43.18% percieved disability    Time 4   Period Weeks   Status Not Met     PT LONG TERM GOAL #3   Title Patient will improve shoulder AROM to > 150 degrees of flexion, scaption, and abduction for improved ability to perform overhead activities.   Baseline 140 degrees flexion, 115 degrees abduction    Time 4   Period Weeks   Status Partially Met     PT LONG TERM GOAL #4   Title Patient will report a worst pain of 3/10 on VAS in    right shoulder         to improve tolerance with ADLs and reduced symptoms with activities.    Baseline Worst pain reported 01/17/16 of 7/10   Time 4   Period Weeks   Status On-going     PT LONG TERM GOAL  #5   Title Patient will improve RUE shoulder strength to >4/5 for improved strength with lifting and ADLs to return to PLOF.    Baseline 3+/5 grossly R shoulder on 01/17/16    Time 4   Period Weeks   Status Partially Met               Plan - 01/29/16 0901    Clinical Impression Statement Patient demonstrates signifcant increase in PROM into ER and active ROM with flexion indicative of return to ability to perform ADLs. Patient reports mild pain at the end of the session, mostly in anterior shoulder, likely a result of tightness of anterior structures such as the pec minor.    Rehab Potential Good   Clinical Impairments Affecting Rehab Potential positive indicator: young in age, motivated, also has help at home; Negative indicator: chronic pain, smoker; Patient's clinical presentation is stable as her pain is localized to right shoulder and does not vary much;    PT Frequency 3x / week   PT Duration 4 weeks   PT Treatment/Interventions ADLs/Self Care Home Management;Cryotherapy;Electrical Stimulation;Iontophoresis 38m/ml Dexamethasone;Moist Heat;Therapeutic exercise;Therapeutic activities;Functional mobility training;Ultrasound;Neuromuscular re-education;Patient/family education;Manual techniques;Taping;Energy conservation;Dry needling;Passive range of motion   PT Next Visit Plan Passive ER, Low rows/single arm pull downs, soft tissue mobilization as indicated.    PT Home Exercise Plan not conitnued per MDs orders   Consulted and Agree with Plan of Care Patient      Patient will benefit from skilled therapeutic intervention in order to improve the following deficits and impairments:  Hypomobility, Decreased activity tolerance, Decreased strength, Pain, Impaired UE functional use, Decreased range of motion, Improper body mechanics, Postural dysfunction, Impaired flexibility  Visit Diagnosis: Pain in right shoulder  Stiffness of right shoulder, not elsewhere classified  Muscle weakness  (generalized)     Problem List Patient Active Problem List   Diagnosis Date Noted  . Menorrhagia with regular cycle 01/23/2016  . Special screening for malignant neoplasms, colon   . Thrombocytosis ([place] 12/06/2015  . Leukocytosis 12/06/2015  . Vitamin B12 deficiency anemia 12/01/2014  . Blood glucose elevated 12/01/2014  . Avitaminosis D 12/01/2014  . GU infection, trichomonal 12/01/2014  . Irregular bleeding 12/01/2014  . Anemia, iron deficiency 12/01/2014  . Essential (primary) hypertension 05/03/2009  . Depression, neurotic 02/03/2009  Kerman Passey, PT, DPT    01/29/2016, 9:06 AM  [place] [facility] [address] [place], [address] Phone: [phone]   Fax:  [phone]  Name: ADDYSON TRAUB MRN: [id number] Date of Birth: 07/23/1965

## 2016-01-29 NOTE — Patient Instructions (Signed)
AROM to 155 degrees (didn't feel bad)    PROM to ER ROM 5 bouts x 30"   Soft tissue mobilization for pain control afterwards (reduced to resting 2/10)   AROM to 168 degrees ROM after soft tissue mobilization.   Low rows with green t-band x 10 for 2 sets (no pain)   Single arm cable pulldowns -- 2 sets x 10, x 8 repetitions at 5#.   AROM to 158-160 degrees after session.

## 2016-01-31 ENCOUNTER — Ambulatory Visit: Payer: PRIVATE HEALTH INSURANCE | Admitting: Physical Therapy

## 2016-01-31 DIAGNOSIS — M25511 Pain in right shoulder: Secondary | ICD-10-CM

## 2016-01-31 DIAGNOSIS — M6281 Muscle weakness (generalized): Secondary | ICD-10-CM

## 2016-01-31 DIAGNOSIS — M25611 Stiffness of right shoulder, not elsewhere classified: Secondary | ICD-10-CM

## 2016-01-31 NOTE — Therapy (Signed)
[place] [facility] [address] [place], [address] Phone: [phone]   Fax:  [phone]  Physical Therapy Treatment  Patient Details  Name: Anna Bates MRN: [id number] Date of Birth: 11-19-1965 Referring Provider: Dr. Onnie Graham  Encounter Date: 01/31/2016      PT End of Session - 01/31/16 1035    Visit Number 24   Number of Visits 30   Date for PT Re-Evaluation 02/22/16   PT Start Time 0806   PT Stop Time 0845   PT Time Calculation (min) 39 min   Activity Tolerance Patient tolerated treatment well   Behavior During Therapy [facility] for tasks assessed/performed      Past Medical History:  Diagnosis Date  . Anemia     Past Surgical History:  Procedure Laterality Date  . BREAST SURGERY  2002   Breast reduction -both  . CESAREAN SECTION  1990  . CHOLECYSTECTOMY  1991  . COLONOSCOPY WITH PROPOFOL N/A 12/21/2015   Procedure: COLONOSCOPY WITH PROPOFOL;  Surgeon: Lucilla Lame, MD;  Location: [place];  Service: Endoscopy;  Laterality: N/A;  . HERNIA REPAIR  1980  . REDUCTION MAMMAPLASTY Bilateral 2000  . right shoulder surgery Right 11/07/15   shoulder scope;   . SHOULDER SURGERY Right 05/09/2009    There were no vitals filed for this visit.      Subjective Assessment - 01/31/16 0807    Subjective Patient reports her pain levels are coming down, she is able to sleep on her L side and recliner without much pain, sleeping on her stomach and on her R side she wakes up in pain. She was sore yesterday, better today.    Pertinent History Personal factors affecting rehab: every day smoker; chronic shoulder pain with multiple injuries;    How long can you sit comfortably? sleeping in a recliner;    How long can you stand comfortably? NA   How long can you walk comfortably? NA   Diagnostic tests no recent scans; goes back to see MD next Tuesday June 13th   Patient Stated Goals "To be able to have full  shoulder ROM of RUE; be able to go back to work without limitations;  be able to swim, hang out with family;    Currently in Pain? Yes   Pain Score 1    Pain Location Shoulder   Pain Orientation Right   Pain Descriptors / Indicators Aching   Pain Type Chronic pain;Surgical pain   Pain Onset More than a month ago   Pain Frequency Intermittent      AROM to 158 degrees   Soft tissue mobilization to tender points in anterior shoulder, appear to have reduced in intensity as well as diameter since initial tx by this therapist.   PROM - ER with end range oscillations 5 bouts x 30" well tolerated until end range, roughly 45-50 degrees today before firm end feel.   AROM to 174 degrees   Prone horizontal abduction with ER at 90 degrees (too intense) at 45 degrees x 12, x 10, x 8  Theraband rows with red x 10   For 2 sets   Attempted passive self seated ER with wand unable to feel stretch, opted for doorway ER stretch which she was able to feel stretch with.                            PT Education - 01/31/16  74    Education provided Yes   Education Details Will contact MD about HEP, gain in ROM today and doorway ER stretch   Person(s) Educated Patient   Methods Explanation;Verbal cues   Comprehension Verbalized understanding;Returned demonstration             PT Long Term Goals - 01/17/16 0844      PT LONG TERM GOAL #1   Title Patient will be independent in home exercise program to improve strength/mobility for better functional independence with ADLs.    Time 4   Period Weeks   Status On-going     PT LONG TERM GOAL #2   Title  Patient will decrease Quick DASH score by > 8 points demonstrating reduced self-reported upper extremity disability.   Baseline 43.18% percieved disability    Time 4   Period Weeks   Status Not Met     PT LONG TERM GOAL #3   Title Patient will improve shoulder AROM to > 150 degrees of flexion, scaption, and abduction for  improved ability to perform overhead activities.   Baseline 140 degrees flexion, 115 degrees abduction    Time 4   Period Weeks   Status Partially Met     PT LONG TERM GOAL #4   Title Patient will report a worst pain of 3/10 on VAS in    right shoulder         to improve tolerance with ADLs and reduced symptoms with activities.    Baseline Worst pain reported 01/17/16 of 7/10   Time 4   Period Weeks   Status On-going     PT LONG TERM GOAL #5   Title Patient will improve RUE shoulder strength to >4/5 for improved strength with lifting and ADLs to return to PLOF.    Baseline 3+/5 grossly R shoulder on 01/17/16    Time 4   Period Weeks   Status Partially Met               Plan - 01/31/16 1035    Clinical Impression Statement Patient demonstrates near full AROM of forward elevation after this session though PROM into ER in testing position remains limited. She reports pain has largely subsided and is now appropriate for stretching/strengthening as well as assessment of IR and extension to restore function.    Rehab Potential Good   Clinical Impairments Affecting Rehab Potential positive indicator: young in age, motivated, also has help at home; Negative indicator: chronic pain, smoker; Patient's clinical presentation is stable as her pain is localized to right shoulder and does not vary much;    PT Frequency 3x / week   PT Duration 4 weeks   PT Treatment/Interventions ADLs/Self Care Home Management;Cryotherapy;Electrical Stimulation;Iontophoresis 62m/ml Dexamethasone;Moist Heat;Therapeutic exercise;Therapeutic activities;Functional mobility training;Ultrasound;Neuromuscular re-education;Patient/family education;Manual techniques;Taping;Energy conservation;Dry needling;Passive range of motion   PT Next Visit Plan Passive ER, Low rows/single arm pull downs, soft tissue mobilization as indicated.    PT Home Exercise Plan not conitnued per MDs orders   Consulted and Agree with Plan of Care  Patient      Patient will benefit from skilled therapeutic intervention in order to improve the following deficits and impairments:  Hypomobility, Decreased activity tolerance, Decreased strength, Pain, Impaired UE functional use, Decreased range of motion, Improper body mechanics, Postural dysfunction, Impaired flexibility  Visit Diagnosis: Pain in right shoulder  Stiffness of right shoulder, not elsewhere classified  Muscle weakness (generalized)     Problem List Patient Active Problem List   Diagnosis  Date Noted  . Menorrhagia with regular cycle 01/23/2016  . Special screening for malignant neoplasms, colon   . Thrombocytosis ([place]) 12/06/2015  . Leukocytosis 12/06/2015  . Vitamin B12 deficiency anemia 12/01/2014  . Blood glucose elevated 12/01/2014  . Avitaminosis D 12/01/2014  . GU infection, trichomonal 12/01/2014  . Irregular bleeding 12/01/2014  . Anemia, iron deficiency 12/01/2014  . Essential (primary) hypertension 05/03/2009  . Depression, neurotic 02/03/2009    Kerman Passey, PT, DPT    01/31/2016, 12:59 PM  [place] [address] [place], [address] Phone: [phone]   Fax:  [phone]  Name: CHANNELL QUATTRONE MRN: [id number] Date of Birth: 05-17-1966

## 2016-01-31 NOTE — Patient Instructions (Addendum)
AROM to 158 degrees   Soft tissue mobilization to tender points in anterior shoulder, appear to have reduced in intensity as well as diameter since initial tx by this therapist.   PROM - ER with end range oscillations 5 bouts x 30" well tolerated until end range, roughly 45-50 degrees today before firm end feel.   AROM to 174 degrees   Prone horizontal abduction with ER at 90 degrees (too intense) at 45 degrees x 12, x 10, x 8  Theraband rows with red x 10   For 2 sets   Attempted passive self seated ER with wand unable to feel stretch, opted for doorway ER stretch which she was able to feel stretch with.

## 2016-02-02 ENCOUNTER — Encounter: Payer: PRIVATE HEALTH INSURANCE | Admitting: Physical Therapy

## 2016-02-05 ENCOUNTER — Encounter: Payer: PRIVATE HEALTH INSURANCE | Admitting: Physical Therapy

## 2016-02-05 ENCOUNTER — Ambulatory Visit: Payer: PRIVATE HEALTH INSURANCE | Admitting: Physical Therapy

## 2016-02-05 DIAGNOSIS — M25511 Pain in right shoulder: Secondary | ICD-10-CM

## 2016-02-05 DIAGNOSIS — M6281 Muscle weakness (generalized): Secondary | ICD-10-CM

## 2016-02-05 DIAGNOSIS — M25611 Stiffness of right shoulder, not elsewhere classified: Secondary | ICD-10-CM

## 2016-02-05 NOTE — Patient Instructions (Addendum)
AROM to 160 degrees, stiffness not pain limited.   PROM with oscillations at end range x 5 bouts of 20-30"   PROM - ER -- 63 degrees   After AROM to 165 degrees (though painful around 90 degrees)   LT rows with yellow t-band x 15, with GTB x 10 for 2 sets   Standing cable rows with 1 arm x 10# for 8 repetitions x 6 repetitions (started to burn/increase in pain)    Soft tissue mobilization to middle deltoid/anterior deltoid and biceps long head attachment -- patient able to flex shoulder in supine with minimal symptoms   Combined adduction/IR/extension to 1 vertebral level below bra line (more stiffness with this than true pain).   Sidelying ER -- 15 repetitions with 1#, 2# x 10 repetitions

## 2016-02-05 NOTE — Therapy (Signed)
[facility] [address], [place], [address] Phone: [phone]   Fax:  [phone]  Physical Therapy Treatment  Patient Details  Name: Anna Bates MRN: [id number] Date of Birth: [birth year] Referring Provider: Dr. Onnie Graham  Encounter Date: 02/05/2016      PT End of Session - 02/05/16 0855    Visit Number 25   Number of Visits 30   Date for PT Re-Evaluation 02/22/16   PT Start Time 0800   PT Stop Time 0841   PT Time Calculation (min) 41 min   Activity Tolerance Patient tolerated treatment well   Behavior During Therapy [facility] for tasks assessed/performed      Past Medical History:  Diagnosis Date  . Anemia     Past Surgical History:  Procedure Laterality Date  . BREAST SURGERY  2002   Breast reduction -both  . CESAREAN SECTION  1990  . CHOLECYSTECTOMY  1991  . COLONOSCOPY WITH PROPOFOL N/A 12/21/2015   Procedure: COLONOSCOPY WITH PROPOFOL;  Surgeon: Lucilla Lame, MD;  Location: [place];  Service: Endoscopy;  Laterality: N/A;  . HERNIA REPAIR  1980  . REDUCTION MAMMAPLASTY Bilateral 2000  . right shoulder surgery Right 11/07/15   shoulder scope;   . SHOULDER SURGERY Right 05/09/2009    There were no vitals filed for this visit.      Subjective Assessment - 02/05/16 0803    Subjective Patient reports she is noticing a decrease in pain, more soreness now. Reports she continues to have stiffness in the morning and through the night, she is sleeping 2-3 hours at a time.    Pertinent History Personal factors affecting rehab: every day smoker; chronic shoulder pain with multiple injuries;    How long can you sit comfortably? sleeping in a recliner;    How long can you stand comfortably? NA   How long can you walk comfortably? NA   Diagnostic tests no recent scans; goes back to see MD next Tuesday June 13th   Patient Stated Goals "To be able to have full shoulder ROM of RUE; be able to  go back to work without limitations;  be able to swim, hang out with family;    Currently in Pain? --  Patient reports she is sore, but a "good sore" not a bad sore.    Pain Location Shoulder   Pain Descriptors / Indicators Aching   Pain Type Chronic pain;Surgical pain   Pain Onset More than a month ago   Pain Frequency Intermittent      AROM to 160 degrees, stiffness not pain limited.   PROM with oscillations at end range x 5 bouts of 20-30"   PROM - ER -- 63 degrees   After AROM to 165 degrees (though painful around 90 degrees)   LT rows with yellow t-band x 15, with GTB x 10 for 2 sets   Standing cable rows with 1 arm x 10# for 8 repetitions x 6 repetitions (started to burn/increase in pain)    Soft tissue mobilization to middle deltoid/anterior deltoid and biceps long head attachment -- patient able to flex shoulder in supine with minimal symptoms   Combined adduction/IR/extension to 1 vertebral level below bra line (more stiffness with this than true pain).   Sidelying ER -- 15 repetitions with 1#, 2# x 10 repetitions  PT Education - 02/05/16 0854    Education provided Yes   Education Details Progress thus far in ROM.   Person(s) Educated Patient   Methods Explanation   Comprehension Verbalized understanding             PT Long Term Goals - 01/17/16 0844      PT LONG TERM GOAL #1   Title Patient will be independent in home exercise program to improve strength/mobility for better functional independence with ADLs.    Time 4   Period Weeks   Status On-going     PT LONG TERM GOAL #2   Title  Patient will decrease Quick DASH score by > 8 points demonstrating reduced self-reported upper extremity disability.   Baseline 43.18% percieved disability    Time 4   Period Weeks   Status Not Met     PT LONG TERM GOAL #3   Title Patient will improve shoulder AROM to > 150 degrees of flexion, scaption, and abduction  for improved ability to perform overhead activities.   Baseline 140 degrees flexion, 115 degrees abduction    Time 4   Period Weeks   Status Partially Met     PT LONG TERM GOAL #4   Title Patient will report a worst pain of 3/10 on VAS in    right shoulder         to improve tolerance with ADLs and reduced symptoms with activities.    Baseline Worst pain reported 01/17/16 of 7/10   Time 4   Period Weeks   Status On-going     PT LONG TERM GOAL #5   Title Patient will improve RUE shoulder strength to >4/5 for improved strength with lifting and ADLs to return to PLOF.    Baseline 3+/5 grossly R shoulder on 01/17/16    Time 4   Period Weeks   Status Partially Met               Plan - 02/05/16 0855    Clinical Impression Statement Patient continues to demonstrate improved AROM in forward elevation as well as significant improvement in ER PROM (to 63 degrees). She reports more soreness than pain now and is appropriate for beginning strength development phase. She demonstrates good IR/adduction/extension combined motion, though reports feeling stiff.    Rehab Potential Good   Clinical Impairments Affecting Rehab Potential positive indicator: young in age, motivated, also has help at home; Negative indicator: chronic pain, smoker; Patient's clinical presentation is stable as her pain is localized to right shoulder and does not vary much;    PT Frequency 3x / week   PT Duration 4 weeks   PT Treatment/Interventions ADLs/Self Care Home Management;Cryotherapy;Electrical Stimulation;Iontophoresis 30m/ml Dexamethasone;Moist Heat;Therapeutic exercise;Therapeutic activities;Functional mobility training;Ultrasound;Neuromuscular re-education;Patient/family education;Manual techniques;Taping;Energy conservation;Dry needling;Passive range of motion   PT Next Visit Plan Passive ER, Low rows/single arm pull downs, soft tissue mobilization as indicated.    PT Home Exercise Plan Doorway ER stretch     Consulted and Agree with Plan of Care Patient      Patient will benefit from skilled therapeutic intervention in order to improve the following deficits and impairments:  Hypomobility, Decreased activity tolerance, Decreased strength, Pain, Impaired UE functional use, Decreased range of motion, Improper body mechanics, Postural dysfunction, Impaired flexibility  Visit Diagnosis: Pain in right shoulder  Stiffness of right shoulder, not elsewhere classified  Muscle weakness (generalized)     Problem List Patient Active Problem List   Diagnosis Date Noted  . Menorrhagia  with regular cycle 01/23/2016  . Special screening for malignant neoplasms, colon   . Thrombocytosis ([place]) 12/06/2015  . Leukocytosis 12/06/2015  . Vitamin B12 deficiency anemia 12/01/2014  . Blood glucose elevated 12/01/2014  . Avitaminosis D 12/01/2014  . GU infection, trichomonal 12/01/2014  . Irregular bleeding 12/01/2014  . Anemia, iron deficiency 12/01/2014  . Essential (primary) hypertension 05/03/2009  . Depression, neurotic 02/03/2009   Kerman Passey, PT, DPT    02/05/2016, 9:00 AM  [facility] [address] [place], [address] Phone: [phone]   Fax:  [phone]  Name: Anna Bates MRN: [id number] Date of Birth: 1966/04/05

## 2016-02-07 ENCOUNTER — Encounter: Payer: PRIVATE HEALTH INSURANCE | Admitting: Physical Therapy

## 2016-02-07 ENCOUNTER — Ambulatory Visit: Payer: PRIVATE HEALTH INSURANCE | Admitting: Physical Therapy

## 2016-02-07 DIAGNOSIS — M6281 Muscle weakness (generalized): Secondary | ICD-10-CM

## 2016-02-07 DIAGNOSIS — M25511 Pain in right shoulder: Secondary | ICD-10-CM | POA: Diagnosis not present

## 2016-02-07 DIAGNOSIS — M25611 Stiffness of right shoulder, not elsewhere classified: Secondary | ICD-10-CM

## 2016-02-08 NOTE — Therapy (Signed)
[facility] [address] [place], [address] Phone: [phone]   Fax:  [phone]  Physical Therapy Treatment  Patient Details  Name: Anna Bates MRN: [id number] Date of Birth: 1965/09/06 Referring Provider: Dr. Onnie Graham  Encounter Date: 02/07/2016      PT End of Session - 02/08/16 1101    Visit Number 26   Number of Visits 30   Date for PT Re-Evaluation 02/22/16   PT Start Time 0815   PT Stop Time 0845   PT Time Calculation (min) 30 min   Activity Tolerance Patient tolerated treatment well   Behavior During Therapy [facility] for tasks assessed/performed      Past Medical History:  Diagnosis Date  . Anemia     Past Surgical History:  Procedure Laterality Date  . BREAST SURGERY  2002   Breast reduction -both  . CESAREAN SECTION  1990  . CHOLECYSTECTOMY  1991  . COLONOSCOPY WITH PROPOFOL N/A 12/21/2015   Procedure: COLONOSCOPY WITH PROPOFOL;  Surgeon: Lucilla Lame, MD;  Location: [place];  Service: Endoscopy;  Laterality: N/A;  . HERNIA REPAIR  1980  . REDUCTION MAMMAPLASTY Bilateral 2000  . right shoulder surgery Right 11/07/15   shoulder scope;   . SHOULDER SURGERY Right 05/09/2009    There were no vitals filed for this visit.      Subjective Assessment - 02/07/16 0813    Subjective Patient reports she was sore in the shoulder joint after last session, she reports she believes it was due to the standing single arm cable rows. She reports she has noticed reduction in her symptoms and improvement in function over the last 2-3 weeks.    Pertinent History Personal factors affecting rehab: every day smoker; chronic shoulder pain with multiple injuries;    How long can you sit comfortably? sleeping in a recliner;    How long can you stand comfortably? NA   How long can you walk comfortably? NA   Diagnostic tests no recent scans; goes back to see MD next Tuesday June 13th   Patient  Stated Goals "To be able to have full shoulder ROM of RUE; be able to go back to work without limitations;  be able to swim, hang out with family;    Currently in Pain? Yes   Pain Score 4    Pain Location Shoulder   Pain Orientation Right   Pain Descriptors / Indicators Aching;Sore   Pain Type Surgical pain;Chronic pain   Pain Radiating Towards Anterior shoulder    Pain Onset More than a month ago   Pain Frequency Constant     Soft tissue mobilization to anterior deltoid, superior aspect of scapular and long head of biceps regions, patient reported more comfort at rest afterwards.   Isometric ERs at roughly 30 degrees of abduction in supine x 5" holds for 3 sets of 5 repetitions    PROM ER mobilizations to end range, mild decrease from previous attempts x 5 bouts for 30 seconds.                            PT Education - 02/08/16 1100    Education provided Yes   Education Details PT will continue to work on matching exercises and dosage to her response.    Person(s) Educated Patient   Methods Explanation   Comprehension Verbalized understanding  PT Long Term Goals - 01/17/16 0844      PT LONG TERM GOAL #1   Title Patient will be independent in home exercise program to improve strength/mobility for better functional independence with ADLs.    Time 4   Period Weeks   Status On-going     PT LONG TERM GOAL #2   Title  Patient will decrease Quick DASH score by > 8 points demonstrating reduced self-reported upper extremity disability.   Baseline 43.18% percieved disability    Time 4   Period Weeks   Status Not Met     PT LONG TERM GOAL #3   Title Patient will improve shoulder AROM to > 150 degrees of flexion, scaption, and abduction for improved ability to perform overhead activities.   Baseline 140 degrees flexion, 115 degrees abduction    Time 4   Period Weeks   Status Partially Met     PT LONG TERM GOAL #4   Title Patient will  report a worst pain of 3/10 on VAS in    right shoulder         to improve tolerance with ADLs and reduced symptoms with activities.    Baseline Worst pain reported 01/17/16 of 7/10   Time 4   Period Weeks   Status On-going     PT LONG TERM GOAL #5   Title Patient will improve RUE shoulder strength to >4/5 for improved strength with lifting and ADLs to return to PLOF.    Baseline 3+/5 grossly R shoulder on 01/17/16    Time 4   Period Weeks   Status Partially Met               Plan - 02/08/16 1101    Clinical Impression Statement Patient continues to struggle with loading tolerance at this time, though ROM, pain symptoms throughout the day all appear improved. She is subjectively reporting more function as well. She would benefit from progressive strengthening program to tolerance to address weakness still noted.    Rehab Potential Good   Clinical Impairments Affecting Rehab Potential positive indicator: young in age, motivated, also has help at home; Negative indicator: chronic pain, smoker; Patient's clinical presentation is stable as her pain is localized to right shoulder and does not vary much;    PT Frequency 3x / week   PT Duration 4 weeks   PT Treatment/Interventions ADLs/Self Care Home Management;Cryotherapy;Electrical Stimulation;Iontophoresis 36m/ml Dexamethasone;Moist Heat;Therapeutic exercise;Therapeutic activities;Functional mobility training;Ultrasound;Neuromuscular re-education;Patient/family education;Manual techniques;Taping;Energy conservation;Dry needling;Passive range of motion   PT Next Visit Plan Passive ER, Low rows/single arm pull downs, soft tissue mobilization as indicated.    PT Home Exercise Plan Doorway ER stretch    Consulted and Agree with Plan of Care Patient      Patient will benefit from skilled therapeutic intervention in order to improve the following deficits and impairments:  Hypomobility, Decreased activity tolerance, Decreased strength, Pain,  Impaired UE functional use, Decreased range of motion, Improper body mechanics, Postural dysfunction, Impaired flexibility  Visit Diagnosis: Pain in right shoulder  Stiffness of right shoulder, not elsewhere classified  Muscle weakness (generalized)     Problem List Patient Active Problem List   Diagnosis Date Noted  . Menorrhagia with regular cycle 01/23/2016  . Special screening for malignant neoplasms, colon   . Thrombocytosis ([place] 12/06/2015  . Leukocytosis 12/06/2015  . Vitamin B12 deficiency anemia 12/01/2014  . Blood glucose elevated 12/01/2014  . Avitaminosis D 12/01/2014  . GU infection, trichomonal 12/01/2014  . Irregular  bleeding 12/01/2014  . Anemia, iron deficiency 12/01/2014  . Essential (primary) hypertension 05/03/2009  . Depression, neurotic 02/03/2009   Kerman Passey, PT, DPT    02/08/2016, 11:09 AM  [facility] [address], [place], [address] Phone: [phone]   Fax:  [phone]  Name: AUSTIN PONGRATZ MRN: [id number] Date of Birth: July 08, 1965

## 2016-02-09 ENCOUNTER — Encounter: Payer: Self-pay | Admitting: Physical Therapy

## 2016-02-13 ENCOUNTER — Ambulatory Visit: Payer: PRIVATE HEALTH INSURANCE | Attending: Orthopedic Surgery | Admitting: Physical Therapy

## 2016-02-13 DIAGNOSIS — M6281 Muscle weakness (generalized): Secondary | ICD-10-CM | POA: Diagnosis present

## 2016-02-13 DIAGNOSIS — M25611 Stiffness of right shoulder, not elsewhere classified: Secondary | ICD-10-CM | POA: Diagnosis present

## 2016-02-13 DIAGNOSIS — M25511 Pain in right shoulder: Secondary | ICD-10-CM | POA: Insufficient documentation

## 2016-02-13 NOTE — Patient Instructions (Signed)
AROM to 167 degrees forward elevation   PROM ER  PROM flexion in supine   Banded wall slides  (regressed to wall slides)   Soft tissue mobilization

## 2016-02-13 NOTE — Therapy (Signed)
[facility] [address] [place], [address] Phone: [phone]   Fax:  [phone]  Physical Therapy Treatment  Patient Details  Name: Anna Bates MRN: [id number] Date of Birth: February 17, 1966 Referring Provider: Dr. Onnie Graham  Encounter Date: 02/13/2016      PT End of Session - 02/13/16 0934    Visit Number 27   Number of Visits 30   Date for PT Re-Evaluation 02/22/16   PT Start Time 0851   PT Stop Time 0929   PT Time Calculation (min) 38 min   Activity Tolerance Patient tolerated treatment well  Limited by tolerance to exercise.    Behavior During Therapy [facility] for tasks assessed/performed      Past Medical History:  Diagnosis Date  . Anemia     Past Surgical History:  Procedure Laterality Date  . BREAST SURGERY  2002   Breast reduction -both  . CESAREAN SECTION  1990  . CHOLECYSTECTOMY  1991  . COLONOSCOPY WITH PROPOFOL N/A 12/21/2015   Procedure: COLONOSCOPY WITH PROPOFOL;  Surgeon: Lucilla Lame, MD;  Location: [facility];  Service: Endoscopy;  Laterality: N/A;  . HERNIA REPAIR  1980  . REDUCTION MAMMAPLASTY Bilateral 2000  . right shoulder surgery Right 11/07/15   shoulder scope;   . SHOULDER SURGERY Right 05/09/2009    There were no vitals filed for this visit.      Subjective Assessment - 02/13/16 0853    Subjective Patient reports on Thursday and Friday of last week she had 2 good days. She reports on the weekend she did more physical activity involving use of her RUE and had increased pain in her shoulder (grocery shopping, attempting to pour coffee and cut food).    Pertinent History Personal factors affecting rehab: every day smoker; chronic shoulder pain with multiple injuries;    How long can you sit comfortably? sleeping in a recliner;    How long can you stand comfortably? NA   How long can you walk comfortably? NA   Diagnostic tests no recent scans; goes back to see  MD next Tuesday June 13th   Patient Stated Goals "To be able to have full shoulder ROM of RUE; be able to go back to work without limitations;  be able to swim, hang out with family;    Currently in Pain? Yes   Pain Score 1    Pain Location Shoulder   Pain Orientation Right   Pain Descriptors / Indicators Aching   Pain Type Chronic pain;Surgical pain   Pain Onset More than a month ago   Pain Frequency Constant      AROM to 167 degrees forward elevation   PROM ER -- 6 bouts x 15" of oscillations -- roughly 70 degrees of motion in this session with musculotendinous end feel, pain/discomfort noted at end range.   PROM flexion in supine -- unable to achieve full range relative to LUE, lacking 10-15 degrees, musculotendinous end feel.  Banded wall slides  (regressed to wall slides) - yellow t-band x 6 with band with cuing to pull band apart which was painful for her, regressed to wall slides which were not as tender.  Soft tissue mobilization on 3 bouts, to pectoral insertion, bicipital head, and upper trapezius all of which were initially tender, however relieved somewhat with bouts of STM (2-5 minutes per bout).  PT Education - 02/13/16 0934    Education provided Yes   Education Details Will provide strengthening and UT stretching program during next follow up session.    Person(s) Educated Patient   Methods Explanation   Comprehension Verbalized understanding             PT Long Term Goals - 01/17/16 0844      PT LONG TERM GOAL #1   Title Patient will be independent in home exercise program to improve strength/mobility for better functional independence with ADLs.    Time 4   Period Weeks   Status On-going     PT LONG TERM GOAL #2   Title  Patient will decrease Quick DASH score by > 8 points demonstrating reduced self-reported upper extremity disability.   Baseline 43.18% percieved disability    Time 4   Period Weeks    Status Not Met     PT LONG TERM GOAL #3   Title Patient will improve shoulder AROM to > 150 degrees of flexion, scaption, and abduction for improved ability to perform overhead activities.   Baseline 140 degrees flexion, 115 degrees abduction    Time 4   Period Weeks   Status Partially Met     PT LONG TERM GOAL #4   Title Patient will report a worst pain of 3/10 on VAS in    right shoulder         to improve tolerance with ADLs and reduced symptoms with activities.    Baseline Worst pain reported 01/17/16 of 7/10   Time 4   Period Weeks   Status On-going     PT LONG TERM GOAL #5   Title Patient will improve RUE shoulder strength to >4/5 for improved strength with lifting and ADLs to return to PLOF.    Baseline 3+/5 grossly R shoulder on 01/17/16    Time 4   Period Weeks   Status Partially Met               Plan - 02/13/16 0935    Clinical Impression Statement  Patient reports 2 good days last week, continues to have pain with increased loading of shoulder (even during ADLs). She appears to have excessive upper trapezius activity (noted by sensitivity in UT on palpation and visual observation of kinematics). Continued work on facilitating upper trapezius and LT activity throughout the ROM is necessary to increase her tolerance for ADLs. She has made good progress with AROM and PROM into flexion and ER, though she likely continues to have symptoms due to loss of ROM, particularly in ER.    Rehab Potential Good   Clinical Impairments Affecting Rehab Potential positive indicator: young in age, motivated, also has help at home; Negative indicator: chronic pain, smoker; Patient's clinical presentation is stable as her pain is localized to right shoulder and does not vary much;    PT Frequency 3x / week   PT Duration 4 weeks   PT Treatment/Interventions ADLs/Self Care Home Management;Cryotherapy;Electrical Stimulation;Iontophoresis 4m/ml Dexamethasone;Moist Heat;Therapeutic  exercise;Therapeutic activities;Functional mobility training;Ultrasound;Neuromuscular re-education;Patient/family education;Manual techniques;Taping;Energy conservation;Dry needling;Passive range of motion   PT Next Visit Plan Passive ER, Low rows/single arm pull downs, soft tissue mobilization as indicated.    PT Home Exercise Plan Doorway ER stretch    Consulted and Agree with Plan of Care Patient      Patient will benefit from skilled therapeutic intervention in order to improve the following deficits and impairments:  Hypomobility, Decreased activity tolerance, Decreased strength, Pain, Impaired  UE functional use, Decreased range of motion, Improper body mechanics, Postural dysfunction, Impaired flexibility  Visit Diagnosis: Pain in right shoulder  Stiffness of right shoulder, not elsewhere classified  Muscle weakness (generalized)     Problem List Patient Active Problem List   Diagnosis Date Noted  . Menorrhagia with regular cycle 01/23/2016  . Special screening for malignant neoplasms, colon   . Thrombocytosis ([place]) 12/06/2015  . Leukocytosis 12/06/2015  . Vitamin B12 deficiency anemia 12/01/2014  . Blood glucose elevated 12/01/2014  . Avitaminosis D 12/01/2014  . GU infection, trichomonal 12/01/2014  . Irregular bleeding 12/01/2014  . Anemia, iron deficiency 12/01/2014  . Essential (primary) hypertension 05/03/2009  . Depression, neurotic 02/03/2009    Kerman Passey, PT, DPT    02/13/2016, 9:44 AM  [facility] [address], [place], [address] Phone: [phone]   Fax:  [phone]  Name: SHARNESE HEATH MRN: [id number] Date of Birth: Apr 23, 1966

## 2016-02-14 ENCOUNTER — Other Ambulatory Visit: Payer: Self-pay

## 2016-02-14 ENCOUNTER — Other Ambulatory Visit: Payer: Self-pay | Admitting: *Deleted

## 2016-02-14 ENCOUNTER — Telehealth: Payer: Self-pay | Admitting: *Deleted

## 2016-02-14 ENCOUNTER — Inpatient Hospital Stay: Payer: 59 | Attending: Hematology and Oncology

## 2016-02-14 DIAGNOSIS — D509 Iron deficiency anemia, unspecified: Secondary | ICD-10-CM

## 2016-02-14 DIAGNOSIS — D473 Essential (hemorrhagic) thrombocythemia: Secondary | ICD-10-CM | POA: Diagnosis not present

## 2016-02-14 DIAGNOSIS — N920 Excessive and frequent menstruation with regular cycle: Secondary | ICD-10-CM

## 2016-02-14 DIAGNOSIS — D75839 Thrombocytosis, unspecified: Secondary | ICD-10-CM

## 2016-02-14 LAB — CBC WITH DIFFERENTIAL/PLATELET
Basophils Absolute: 0.3 10*3/uL — ABNORMAL HIGH (ref 0–0.1)
Basophils Relative: 4 %
Eosinophils Absolute: 0.4 10*3/uL (ref 0–0.7)
Eosinophils Relative: 4 %
HCT: 39.2 % (ref 35.0–47.0)
Hemoglobin: 12.9 g/dL (ref 12.0–16.0)
Lymphocytes Relative: 26 %
Lymphs Abs: 2.4 10*3/uL (ref 1.0–3.6)
MCH: 26.5 pg (ref 26.0–34.0)
MCHC: 32.9 g/dL (ref 32.0–36.0)
MCV: 80.6 fL (ref 80.0–100.0)
Monocytes Absolute: 0.4 10*3/uL (ref 0.2–0.9)
Monocytes Relative: 4 %
Neutro Abs: 5.8 10*3/uL (ref 1.4–6.5)
Neutrophils Relative %: 62 %
Platelets: 664 10*3/uL — ABNORMAL HIGH (ref 150–440)
RBC: 4.87 MIL/uL (ref 3.80–5.20)
RDW: 23.1 % — ABNORMAL HIGH (ref 11.5–14.5)
WBC: 9.3 10*3/uL (ref 3.6–11.0)

## 2016-02-14 LAB — FERRITIN: Ferritin: 30 ng/mL (ref 11–307)

## 2016-02-14 NOTE — Telephone Encounter (Signed)
Was told to call when she has her period and we will do testing for Von Willebrand's, she is having her heaviest period ever right now and wants to come in today.  She will come in at 1 PM today for labs

## 2016-02-14 NOTE — Progress Notes (Unsigned)
vo

## 2016-02-15 ENCOUNTER — Ambulatory Visit: Payer: PRIVATE HEALTH INSURANCE | Admitting: Physical Therapy

## 2016-02-15 DIAGNOSIS — M25511 Pain in right shoulder: Secondary | ICD-10-CM | POA: Diagnosis not present

## 2016-02-15 DIAGNOSIS — M6281 Muscle weakness (generalized): Secondary | ICD-10-CM

## 2016-02-15 DIAGNOSIS — M25611 Stiffness of right shoulder, not elsewhere classified: Secondary | ICD-10-CM

## 2016-02-15 LAB — COAG STUDIES INTERP REPORT: PDF Image: 0

## 2016-02-15 LAB — VON WILLEBRAND PANEL
Coagulation Factor VIII: 47 % — ABNORMAL LOW (ref 57–163)
Ristocetin Co-factor, Plasma: 51 % (ref 50–200)
Von Willebrand Antigen, Plasma: 68 % (ref 50–200)

## 2016-02-15 NOTE — Patient Instructions (Addendum)
PROM - ER lacking 20-25 degrees initially 15-20 after stretching performed  AROM in FE to 170 degrees   Bilateral ER at sides x 10 with yellow t-band, x 10 red for 2 sets (challenging)   Seated rows on OMEGA - x10 repetitions in standing at 5#, x 10 repetitions at 10#, x 15# for x8 repetitions through controlled ROM    Attempted Scaption raises with 4#, 2#, 1# (eccentrics)  Soft tissue mobilization

## 2016-02-15 NOTE — Therapy (Signed)
[facility] [address], [place], [address] Phone: [phone]   Fax:  [phone]  Physical Therapy Treatment  Patient Details  Name: Anna Bates MRN: [id number] Date of Birth: [birth year] Referring Provider: Dr. Onnie Graham  Encounter Date: 02/15/2016      PT End of Session - 02/15/16 0905    Visit Number 28   Number of Visits 30   Date for PT Re-Evaluation 02/22/16   PT Start Time 0854   PT Stop Time 0938   PT Time Calculation (min) 44 min   Activity Tolerance Patient tolerated treatment well  Limited by tolerance to exercise   Behavior During Therapy [facility] for tasks assessed/performed      Past Medical History:  Diagnosis Date  . Anemia     Past Surgical History:  Procedure Laterality Date  . BREAST SURGERY  2002   Breast reduction -both  . CESAREAN SECTION  1990  . CHOLECYSTECTOMY  1991  . COLONOSCOPY WITH PROPOFOL N/A 12/21/2015   Procedure: COLONOSCOPY WITH PROPOFOL;  Surgeon: Lucilla Lame, MD;  Location: [facility];  Service: Endoscopy;  Laterality: N/A;  . HERNIA REPAIR  1980  . REDUCTION MAMMAPLASTY Bilateral 2000  . right shoulder surgery Right 11/07/15   shoulder scope;   . SHOULDER SURGERY Right 05/09/2009    There were no vitals filed for this visit.      Subjective Assessment - 02/15/16 0856    Subjective Patient reports she has felt roughly the same as her recent baseline since the past session. She has been doing her ER door stretch.   Pertinent History Personal factors affecting rehab: every day smoker; chronic shoulder pain with multiple injuries;    How long can you sit comfortably? sleeping in a recliner;    How long can you stand comfortably? NA   How long can you walk comfortably? NA   Diagnostic tests no recent scans; goes back to see MD next Tuesday June 13th   Patient Stated Goals "To be able to have full shoulder ROM of RUE; be able to go back to work  without limitations;  be able to swim, hang out with family;    Currently in Pain? Yes   Pain Score --  She has been at her recent baseline in pain, mild it appears.   Pain Location Shoulder   Pain Orientation Right   Pain Descriptors / Indicators Aching   Pain Type Chronic pain;Surgical pain   Pain Onset More than a month ago   Pain Frequency Constant      PROM - ER lacking 20-25 degrees initially 15-20 after stretching performed  AROM in FE to 170 degrees   Bilateral ER at sides x 10 with yellow t-band, x 10 red for 2 sets (challenging)   Seated rows on OMEGA - x10 repetitions in standing at 5#, x 10 repetitions at 10#, x 15# for x8 repetitions through controlled ROM -- cuing to retract scapula.    Attempted Scaption raises with 4#, 2#, 1# (eccentrics) -- unable to tolerate 2#, 4#, 1# was appropriate, completed 2 sets of 6 repetitions.   Soft tissue mobilization to conclude session as patient reported some increase in pain from baseline at beginning of session.                            PT Education - 02/15/16 2318    Education provided Yes  Education Details Pulling mechanics for UE rows, progress HEP to include bilateral ERs with band, pain should reduce as ER and AROM have returned.    Person(s) Educated Patient   Methods Explanation;Demonstration   Comprehension Returned demonstration;Verbalized understanding             PT Long Term Goals - 01/17/16 0844      PT LONG TERM GOAL #1   Title Patient will be independent in home exercise program to improve strength/mobility for better functional independence with ADLs.    Time 4   Period Weeks   Status On-going     PT LONG TERM GOAL #2   Title  Patient will decrease Quick DASH score by > 8 points demonstrating reduced self-reported upper extremity disability.   Baseline 43.18% percieved disability    Time 4   Period Weeks   Status Not Met     PT LONG TERM GOAL #3   Title Patient will  improve shoulder AROM to > 150 degrees of flexion, scaption, and abduction for improved ability to perform overhead activities.   Baseline 140 degrees flexion, 115 degrees abduction    Time 4   Period Weeks   Status Partially Met     PT LONG TERM GOAL #4   Title Patient will report a worst pain of 3/10 on VAS in    right shoulder         to improve tolerance with ADLs and reduced symptoms with activities.    Baseline Worst pain reported 01/17/16 of 7/10   Time 4   Period Weeks   Status On-going     PT LONG TERM GOAL #5   Title Patient will improve RUE shoulder strength to >4/5 for improved strength with lifting and ADLs to return to PLOF.    Baseline 3+/5 grossly R shoulder on 01/17/16    Time 4   Period Weeks   Status Partially Met               Plan - 02/15/16 2319    Clinical Impression Statement Patient continues to have pain and weakness symptoms in RUE, though notable increase in AROM and ER PROM, which in this author's experience typically correlate with decreased pain symptoms. She is able to tolerate low loading levels today, though appears to struggle with pain as forward elevation reaches ~ 90 degrees. Continued strengthening, particularly eccentric loading may be of some benefit to address the listed deficits.    Rehab Potential Good   Clinical Impairments Affecting Rehab Potential positive indicator: young in age, motivated, also has help at home; Negative indicator: chronic pain, smoker; Patient's clinical presentation is stable as her pain is localized to right shoulder and does not vary much;    PT Frequency 3x / week   PT Duration 4 weeks   PT Treatment/Interventions ADLs/Self Care Home Management;Cryotherapy;Electrical Stimulation;Iontophoresis 24m/ml Dexamethasone;Moist Heat;Therapeutic exercise;Therapeutic activities;Functional mobility training;Ultrasound;Neuromuscular re-education;Patient/family education;Manual techniques;Taping;Energy conservation;Dry  needling;Passive range of motion   PT Next Visit Plan Passive ER, Low rows/single arm pull downs, soft tissue mobilization as indicated.    PT Home Exercise Plan Doorway ER stretch    Consulted and Agree with Plan of Care Patient      Patient will benefit from skilled therapeutic intervention in order to improve the following deficits and impairments:  Hypomobility, Decreased activity tolerance, Decreased strength, Pain, Impaired UE functional use, Decreased range of motion, Improper body mechanics, Postural dysfunction, Impaired flexibility  Visit Diagnosis: Pain in right shoulder  Stiffness of  right shoulder, not elsewhere classified  Muscle weakness (generalized)     Problem List Patient Active Problem List   Diagnosis Date Noted  . Menorrhagia with regular cycle 01/23/2016  . Special screening for malignant neoplasms, colon   . Thrombocytosis ([place]) 12/06/2015  . Leukocytosis 12/06/2015  . Vitamin B12 deficiency anemia 12/01/2014  . Blood glucose elevated 12/01/2014  . Avitaminosis D 12/01/2014  . GU infection, trichomonal 12/01/2014  . Irregular bleeding 12/01/2014  . Anemia, iron deficiency 12/01/2014  . Essential (primary) hypertension 05/03/2009  . Depression, neurotic 02/03/2009   Kerman Passey, PT, DPT    02/15/2016, 11:22 PM  [facility] [address] [place], [address] Phone: [phone]   Fax:  [phone]  Name: Anna Bates MRN: [id number] Date of Birth: [birth year]

## 2016-02-19 ENCOUNTER — Ambulatory Visit: Payer: PRIVATE HEALTH INSURANCE | Admitting: Physical Therapy

## 2016-02-19 DIAGNOSIS — M25611 Stiffness of right shoulder, not elsewhere classified: Secondary | ICD-10-CM

## 2016-02-19 DIAGNOSIS — M6281 Muscle weakness (generalized): Secondary | ICD-10-CM

## 2016-02-19 DIAGNOSIS — M25511 Pain in right shoulder: Secondary | ICD-10-CM | POA: Diagnosis not present

## 2016-02-19 NOTE — Therapy (Signed)
[facility] [address] [place], [address] Phone: [phone]   Fax:  [phone]  Physical Therapy Treatment  Patient Details  Name: Anna Bates MRN: [id number] Date of Birth: [birth year] Referring Provider: Dr. Onnie Graham  Encounter Date: 02/19/2016      PT End of Session - 02/19/16 1016    Visit Number 29   Number of Visits 30   Date for PT Re-Evaluation 02/22/16   PT Start Time 0801   PT Stop Time 0845   PT Time Calculation (min) 44 min   Activity Tolerance Patient tolerated treatment well  Fatigues and has pain increase with several exercises   Behavior During Therapy [facility] for tasks assessed/performed      Past Medical History:  Diagnosis Date  . Anemia     Past Surgical History:  Procedure Laterality Date  . BREAST SURGERY  2002   Breast reduction -both  . CESAREAN SECTION  1990  . CHOLECYSTECTOMY  1991  . COLONOSCOPY WITH PROPOFOL N/A 12/21/2015   Procedure: COLONOSCOPY WITH PROPOFOL;  Surgeon: Lucilla Lame, MD;  Location: [place];  Service: Endoscopy;  Laterality: N/A;  . HERNIA REPAIR  1980  . REDUCTION MAMMAPLASTY Bilateral 2000  . right shoulder surgery Right 11/07/15   shoulder scope;   . SHOULDER SURGERY Right 05/09/2009    There were no vitals filed for this visit.      Subjective Assessment - 02/19/16 0803    Subjective Patient reports she had some intermittent sharp pains after using the elastic band over the weekend. She had no problem with wand stretching or passive ER stretching. She has had soreness over the weekend otherwise, she describes as pulsating.    Pertinent History Personal factors affecting rehab: every day smoker; chronic shoulder pain with multiple injuries;    How long can you sit comfortably? sleeping in a recliner;    How long can you stand comfortably? NA   How long can you walk comfortably? NA   Diagnostic tests no recent scans; goes back  to see MD next Tuesday June 13th   Patient Stated Goals "To be able to have full shoulder ROM of RUE; be able to go back to work without limitations;  be able to swim, hang out with family;    Currently in Pain? Yes   Pain Score --  Patient reports moderate pulsating like pain.    Pain Location Shoulder   Pain Orientation Right   Pain Descriptors / Indicators Aching   Pain Type Chronic pain;Surgical pain   Pain Onset More than a month ago   Pain Frequency Intermittent      169 degrees AROM in FE   PROM - ER up to 68 degrees, provided repetitive oscillations x 10 minutes, still sore after though notable increase in ROM   Push ups through 1/2 ROM to TM x 8, x6 (no significant increase in pain, though challenging for her)   Low Rows with red t-band x 12, x 10 with green t-band (able to complete though required cuing not to fire UTs and shoulder shrug as patient likely continues to "impinge" due to excessive UT involvement.   Eccentric x8 with 2# DB scaptions -- noted to have fatigue/pain though patient insisted on completing (used 2 hands to lift concentrically) RUE only for eccentric.   Carrying/lifting practice -- while lifting box with 5#, pillow with 5# weight on, noted to have excessive UT, long lever arm from weight  to her COM, and active biceps involvement during entire activity mimicking her work demands. Educated patient to keep elbows extended, object as near to her COM as possible, and avoid shrugging.                             PT Education - 02/19/16 1015    Education provided Yes   Education Details Educated patient on mechanics to lift trays as this has been a complaint of hers at work, her technique noted to have biceps active involvement and shoulder hiking, educated to keep arms extended and objects on her COM to reduce lever arm.    Person(s) Educated Patient   Methods Explanation;Demonstration   Comprehension Returned demonstration;Verbalized  understanding;Need further instruction             PT Long Term Goals - 02/19/16 1017      PT LONG TERM GOAL #1   Title Patient will be independent in home exercise program to improve strength/mobility for better functional independence with ADLs.    Baseline 44% disability 05/16/15   Time 4   Period Weeks   Status On-going     PT LONG TERM GOAL #2   Title  Patient will decrease Quick DASH score by > 8 points demonstrating reduced self-reported upper extremity disability.   Baseline 43.18% percieved disability    Time 4   Period Weeks   Status On-going     PT LONG TERM GOAL #3   Title Patient will improve shoulder AROM to > 150 degrees of flexion, scaption, and abduction for improved ability to perform overhead activities.   Baseline 140 degrees flexion, 115 degrees abduction on 01/17/16, able to flex to 168 on 9/11   Time 4   Period Weeks   Status Achieved     PT LONG TERM GOAL #4   Title Patient will report a worst pain of 3/10 on VAS in    right shoulder         to improve tolerance with ADLs and reduced symptoms with activities.    Baseline Worst pain reported 01/17/16 of 7/10. -- Patient reports instances of sharp pain, mostly mild-moderate on 9/11   Time 4   Period Weeks   Status On-going     PT LONG TERM GOAL #5   Title Patient will improve RUE shoulder strength to >4/5 for improved strength with lifting and ADLs to return to PLOF.    Baseline 3+/5 grossly R shoulder on 01/17/16 -- not formally tested on 9/11   Time 4   Period Weeks   Status On-going               Plan - 02/19/16 1016    Clinical Impression Statement Patient presenting with decreased ER PROM though improved from first session with this author. She continues to report pain with activities, however noted to have shoulder shrugging with work related tasks, educated to complete with less biceps active involvement and decreased lever arm for reduced shoulder torque. Patient continues to be  progressively loaded with activities to increase shoulder tolerance, though would benefit from additional skilled therapy services to address strength, PROM into ER to improve function of RUE.    Rehab Potential Good   Clinical Impairments Affecting Rehab Potential positive indicator: young in age, motivated, also has help at home; Negative indicator: chronic pain, smoker; Patient's clinical presentation is stable as her pain is localized to right shoulder and does not  vary much;    PT Frequency 3x / week   PT Duration 4 weeks   PT Treatment/Interventions ADLs/Self Care Home Management;Cryotherapy;Electrical Stimulation;Iontophoresis 4mg /ml Dexamethasone;Moist Heat;Therapeutic exercise;Therapeutic activities;Functional mobility training;Ultrasound;Neuromuscular re-education;Patient/family education;Manual techniques;Taping;Energy conservation;Dry needling;Passive range of motion   PT Next Visit Plan Passive ER, Low rows/single arm pull downs, soft tissue mobilization as indicated.    PT Home Exercise Plan Doorway ER stretch    Consulted and Agree with Plan of Care Patient      Patient will benefit from skilled therapeutic intervention in order to improve the following deficits and impairments:  Hypomobility, Decreased activity tolerance, Decreased strength, Pain, Impaired UE functional use, Decreased range of motion, Improper body mechanics, Postural dysfunction, Impaired flexibility  Visit Diagnosis: Pain in right shoulder  Stiffness of right shoulder, not elsewhere classified  Muscle weakness (generalized)     Problem List Patient Active Problem List   Diagnosis Date Noted  . Menorrhagia with regular cycle 01/23/2016  . Special screening for malignant neoplasms, colon   . Thrombocytosis ([place]) 12/06/2015  . Leukocytosis 12/06/2015  . Vitamin B12 deficiency anemia 12/01/2014  . Blood glucose elevated 12/01/2014  . Avitaminosis D 12/01/2014  . GU infection, trichomonal 12/01/2014  .  Irregular bleeding 12/01/2014  . Anemia, iron deficiency 12/01/2014  . Essential (primary) hypertension 05/03/2009  . Depression, neurotic 02/03/2009   Kerman Passey, PT, DPT    02/19/2016, 10:40 AM  [facility] [address] [place], [address] Phone: [phone]   Fax:  [phone]  Name: Anna Bates MRN: [id number] Date of Birth: May 13, 1966

## 2016-02-19 NOTE — Patient Instructions (Addendum)
169 degrees AROM in FE   PROM - ER up to 68 degrees, provided repetitive oscillations x 10 minutes, still sore after though notable increase in ROM   Push ups through 1/2 ROM to TM x 8, x6  Low Rows with red t-band x 12  Eccentric x8 with 2# DB scaptions   Carrying/lifting practice

## 2016-02-20 ENCOUNTER — Inpatient Hospital Stay: Payer: 59

## 2016-02-21 ENCOUNTER — Ambulatory Visit: Payer: 59 | Attending: Orthopedic Surgery | Admitting: Physical Therapy

## 2016-02-21 DIAGNOSIS — M6281 Muscle weakness (generalized): Secondary | ICD-10-CM | POA: Diagnosis not present

## 2016-02-21 DIAGNOSIS — M25611 Stiffness of right shoulder, not elsewhere classified: Secondary | ICD-10-CM | POA: Diagnosis not present

## 2016-02-21 DIAGNOSIS — M25511 Pain in right shoulder: Secondary | ICD-10-CM | POA: Insufficient documentation

## 2016-02-21 NOTE — Patient Instructions (Addendum)
Prone rows with BW x 12 not challenging, x 12   With 2# DB, 8 repetiitons with 2# DB  Prone horizontal abductions with body weight x 6, x  X 6 (too painful, dc'd and had patient perform at lower abduction angle.) At roughly 45 degrees perform x 6, x 8 repetitions with BW (more comfortable) x8 repetitions with 1#    Chest press with 2# DB x 8, x 6 (dc'd as she was beginning to fatigue/become painful) -- targeting serratus anterior with this exercise   PROM to ER x 5 minutes through roughly 50-65 degrees,well tolerated but continues to present with resistance earlier in ROM than desired for decreased symptoms with OH motion.

## 2016-02-21 NOTE — Therapy (Signed)
[facility] [address] [place], [address] Phone: [phone]   Fax:  [phone]  Physical Therapy Treatment  Patient Details  Name: Anna Bates MRN: [id number] Date of Birth: 1966/01/05 Referring Provider: Dr. Onnie Graham  Encounter Date: 02/21/2016      PT End of Session - 02/21/16 0918    Visit Number 30   Number of Visits 42   Date for PT Re-Evaluation 04/03/16   PT Start Time 0808   PT Stop Time 0845   PT Time Calculation (min) 37 min   Activity Tolerance Patient tolerated treatment well;Patient limited by pain  Had to dc 2 exercises due to increase in pain   Behavior During Therapy [facility] for tasks assessed/performed      Past Medical History:  Diagnosis Date  . Anemia     Past Surgical History:  Procedure Laterality Date  . BREAST SURGERY  2002   Breast reduction -both  . CESAREAN SECTION  1990  . CHOLECYSTECTOMY  1991  . COLONOSCOPY WITH PROPOFOL N/A 12/21/2015   Procedure: COLONOSCOPY WITH PROPOFOL;  Surgeon: Lucilla Lame, MD;  Location: [place];  Service: Endoscopy;  Laterality: N/A;  . HERNIA REPAIR  1980  . REDUCTION MAMMAPLASTY Bilateral 2000  . right shoulder surgery Right 11/07/15   shoulder scope;   . SHOULDER SURGERY Right 05/09/2009    There were no vitals filed for this visit.      Subjective Assessment - 02/21/16 0810    Subjective Patient reports she was very sore Monday after being seen by her orthopedic surgeon and having therapy that day as well. She reports Tuesday was a much better day, she reports more soreness today from use of muscle forces. She has been cleared for light OH lifting, no more than 10#.   Pertinent History Personal factors affecting rehab: every day smoker; chronic shoulder pain with multiple injuries;    How long can you sit comfortably? sleeping in a recliner;    How long can you stand comfortably? NA   How long can you walk  comfortably? NA   Diagnostic tests no recent scans; goes back to see MD next Tuesday June 13th   Patient Stated Goals "To be able to have full shoulder ROM of RUE; be able to go back to work without limitations;  be able to swim, hang out with family;    Currently in Pain? Yes  Patient reports it is a little sore today but more like a post-workout soreness.   Pain Location Shoulder   Pain Orientation Right   Pain Descriptors / Indicators Sore   Pain Type Chronic pain;Surgical pain   Pain Radiating Towards Anterior and lateral shoulder.    Pain Onset More than a month ago      Prone rows with BW x 12 not challenging, x 12   With 2# DB, 8 repetiitons with 2# DB  Prone horizontal abductions with body weight x 6, x  X 6 (too painful, dc'd and had patient perform at lower abduction angle.) At roughly 45 degrees perform x 6, x 8 repetitions with BW (more comfortable) x8 repetitions with 1#    Chest press with 2# DB x 8, x 6 (dc'd as she was beginning to fatigue/become painful) -- targeting serratus anterior with this exercise   PROM to ER x 5 minutes through roughly 50-65 degrees,well tolerated but continues to present with resistance earlier in ROM than desired for decreased symptoms with  OH motion.                             PT Education - 02/21/16 0914    Education provided Yes   Education Details Educated patient on progress with tolerance for ther-ex, will continue to consider ways for her to complete bathroom tasks at work.    Person(s) Educated Patient   Methods Explanation;Demonstration   Comprehension Verbalized understanding;Returned demonstration             PT Long Term Goals - 02/19/16 1017      PT LONG TERM GOAL #1   Title Patient will be independent in home exercise program to improve strength/mobility for better functional independence with ADLs.    Baseline 44% disability 05/16/15   Time 4   Period Weeks   Status On-going     PT LONG TERM  GOAL #2   Title  Patient will decrease Quick DASH score by > 8 points demonstrating reduced self-reported upper extremity disability.   Baseline 43.18% percieved disability    Time 4   Period Weeks   Status On-going     PT LONG TERM GOAL #3   Title Patient will improve shoulder AROM to > 150 degrees of flexion, scaption, and abduction for improved ability to perform overhead activities.   Baseline 140 degrees flexion, 115 degrees abduction on 01/17/16, able to flex to 168 on 9/11   Time 4   Period Weeks   Status Achieved     PT LONG TERM GOAL #4   Title Patient will report a worst pain of 3/10 on VAS in    right shoulder         to improve tolerance with ADLs and reduced symptoms with activities.    Baseline Worst pain reported 01/17/16 of 7/10. -- Patient reports instances of sharp pain, mostly mild-moderate on 9/11   Time 4   Period Weeks   Status On-going     PT LONG TERM GOAL #5   Title Patient will improve RUE shoulder strength to >4/5 for improved strength with lifting and ADLs to return to PLOF.    Baseline 3+/5 grossly R shoulder on 01/17/16 -- not formally tested on 9/11   Time 4   Period Weeks   Status On-going               Plan - 02/21/16 0919    Clinical Impression Statement Patient continues to present with decreased ER PROM, have provided home exercise for this and PT continuing to stretch each session. She is gradually tolerating increased loads at low angles of forward elevation/abduction however she is still limited by pain/soreness likely due to decreased ER PROM and decreased strength around shoulder joint. Patient would continue to benefit from skilled PT services to address her functional limitations with her RUE.    Rehab Potential Good   Clinical Impairments Affecting Rehab Potential positive indicator: young in age, motivated, also has help at home; Negative indicator: chronic pain, smoker; Patient's clinical presentation is stable as her pain is localized  to right shoulder and does not vary much;    PT Frequency 2x / week   PT Duration 6 weeks   PT Treatment/Interventions ADLs/Self Care Home Management;Cryotherapy;Electrical Stimulation;Iontophoresis 4mg /ml Dexamethasone;Moist Heat;Therapeutic exercise;Therapeutic activities;Functional mobility training;Ultrasound;Neuromuscular re-education;Patient/family education;Manual techniques;Taping;Energy conservation;Dry needling;Passive range of motion   PT Next Visit Plan Passive ER, Low rows/single arm pull downs, soft tissue mobilization as indicated.  PT Home Exercise Plan Doorway ER stretch    Consulted and Agree with Plan of Care Patient      Patient will benefit from skilled therapeutic intervention in order to improve the following deficits and impairments:  Hypomobility, Decreased activity tolerance, Decreased strength, Pain, Impaired UE functional use, Decreased range of motion, Improper body mechanics, Postural dysfunction, Impaired flexibility  Visit Diagnosis: Pain in right shoulder  Stiffness of right shoulder, not elsewhere classified  Muscle weakness (generalized)     Problem List Patient Active Problem List   Diagnosis Date Noted  . Menorrhagia with regular cycle 01/23/2016  . Special screening for malignant neoplasms, colon   . Thrombocytosis ([place]) 12/06/2015  . Leukocytosis 12/06/2015  . Vitamin B12 deficiency anemia 12/01/2014  . Blood glucose elevated 12/01/2014  . Avitaminosis D 12/01/2014  . GU infection, trichomonal 12/01/2014  . Irregular bleeding 12/01/2014  . Anemia, iron deficiency 12/01/2014  . Essential (primary) hypertension 05/03/2009  . Depression, neurotic 02/03/2009   Kerman Passey, PT, DPT    02/21/2016, 9:24 AM  [facility] [address], [place], [address] Phone: [phone]   Fax:  [phone]  Name: Anna Bates MRN: [id number] Date of Birth:  07-23-1965

## 2016-02-26 ENCOUNTER — Ambulatory Visit: Payer: PRIVATE HEALTH INSURANCE | Admitting: Physical Therapy

## 2016-02-26 DIAGNOSIS — M25511 Pain in right shoulder: Secondary | ICD-10-CM | POA: Diagnosis not present

## 2016-02-26 DIAGNOSIS — M6281 Muscle weakness (generalized): Secondary | ICD-10-CM

## 2016-02-26 DIAGNOSIS — M25611 Stiffness of right shoulder, not elsewhere classified: Secondary | ICD-10-CM

## 2016-02-26 NOTE — Patient Instructions (Signed)
Educated patient on scapular mechanics   Scapular punches with 2# DB x 12, with 3# x 8 repetitions (noted for pain), with 2# x 10 repetitions   Manually facilitated scaption to 90 degrees   PROM to ER    Soft tissue mobilization

## 2016-02-26 NOTE — Therapy (Signed)
[facility] [address], [place], [address] Phone: [phone]   Fax:  [phone]  Physical Therapy Treatment  Patient Details  Name: Anna Bates MRN: [id number] Date of Birth: [birth year] Referring Provider: Dr. Onnie Graham  Encounter Date: 02/26/2016      PT End of Session - 02/26/16 1501    Visit Number 31   Number of Visits 42   Date for PT Re-Evaluation 04/03/16   PT Start Time 0800   PT Stop Time 0844   PT Time Calculation (min) 44 min   Activity Tolerance Patient tolerated treatment well  Had to dc 2 exercises due to increase in pain   Behavior During Therapy [facility] for tasks assessed/performed      Past Medical History:  Diagnosis Date  . Anemia     Past Surgical History:  Procedure Laterality Date  . BREAST SURGERY  2002   Breast reduction -both  . CESAREAN SECTION  1990  . CHOLECYSTECTOMY  1991  . COLONOSCOPY WITH PROPOFOL N/A 12/21/2015   Procedure: COLONOSCOPY WITH PROPOFOL;  Surgeon: Lucilla Lame, MD;  Location: [facility];  Service: Endoscopy;  Laterality: N/A;  . HERNIA REPAIR  1980  . REDUCTION MAMMAPLASTY Bilateral 2000  . right shoulder surgery Right 11/07/15   shoulder scope;   . SHOULDER SURGERY Right 05/09/2009    There were no vitals filed for this visit.      Subjective Assessment - 02/26/16 0804    Subjective Patient reports she reported to work on Friday on light duty. 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 Reports she had a good day Saturday and was even able to lift a 2L bottle of pop with no issue. On Sunday she carried grocery bags, which she thinks over-did it and had more pain.    Pertinent History Personal factors affecting rehab: every day smoker; chronic shoulder pain with multiple injuries;    How long can you sit comfortably? sleeping in a recliner;    How long can you stand comfortably? NA   How long can you walk comfortably? NA   Diagnostic tests no recent scans; goes back to see MD next Tuesday June 13th   Patient Stated Goals "To be able to have full shoulder ROM of RUE; be able to go back to work without limitations;  be able to swim, hang out with family;    Currently in Pain? Yes   Pain Score --  Patient does not rate pain, but reports it is there but that she is feeling pretty good.    Pain Location Shoulder   Pain Orientation Right   Pain Descriptors / Indicators Sore   Pain Type Chronic pain;Surgical pain   Pain Onset More than a month ago   Pain Frequency Intermittent   Aggravating Factors  Repetitive OH movements, quick movements    Pain Relieving Factors Pain medication, ice        Educated patient on scapular mechanics including congruence between humeral head and glenoid fossa throughout the upward rotation and elevation phase.   Scapular punches with 2# DB x 12, with 3# x 8 repetitions (noted for pain), with 2# x 10 repetitions   Manually facilitated scaption to 90 degrees 3 bouts x 12 repetitions patient reported much less pain than typical for this ROM (PT assisted in upward rotation, ER of the scapula with concurrent tactile cuing for decreased UT activation).  PROM to ER  5 bouts x 30-60" as patient continues to lack 20-30 degrees from desired ER ROM, educated to complete additional ER stretching at home.   Soft tissue mobilization -- to UT, supraspinatus areas with reported decline, though not dissipation of symptoms.                           PT Education - 02/26/16 1510    Education provided Yes   Education Details Complete additional ER stretching and NM re-training at home with emphasis on decreased UT activity.    Person(s) Educated Patient   Methods Explanation;Demonstration;Verbal cues;Tactile cues   Comprehension Verbalized understanding;Returned demonstration;Verbal cues required;Tactile cues required;Need further  instruction             PT Long Term Goals - 02/19/16 1017      PT LONG TERM GOAL #1   Title Patient will be independent in home exercise program to improve strength/mobility for better functional independence with ADLs.    Baseline 44% disability 05/16/15   Time 4   Period Weeks   Status On-going     PT LONG TERM GOAL #2   Title  Patient will decrease Quick DASH score by > 8 points demonstrating reduced self-reported upper extremity disability.   Baseline 43.18% percieved disability    Time 4   Period Weeks   Status On-going     PT LONG TERM GOAL #3   Title Patient will improve shoulder AROM to > 150 degrees of flexion, scaption, and abduction for improved ability to perform overhead activities.   Baseline 140 degrees flexion, 115 degrees abduction on 01/17/16, able to flex to 168 on 9/11   Time 4   Period Weeks   Status Achieved     PT LONG TERM GOAL #4   Title Patient will report a worst pain of 3/10 on VAS in    right shoulder         to improve tolerance with ADLs and reduced symptoms with activities.    Baseline Worst pain reported 01/17/16 of 7/10. -- Patient reports instances of sharp pain, mostly mild-moderate on 9/11   Time 4   Period Weeks   Status On-going     PT LONG TERM GOAL #5   Title Patient will improve RUE shoulder strength to >4/5 for improved strength with lifting and ADLs to return to PLOF.    Baseline 3+/5 grossly R shoulder on 01/17/16 -- not formally tested on 9/11   Time 4   Period Weeks   Status On-going               Plan - 02/26/16 1502    Clinical Impression Statement Patient reports decreased pain through limited ROM with manual facilitation of upward rotation and ER of scapula with cuing for decreased activity of UT. Provided this for HEP to begin NM re-education for kinematic/muscular activity timing changes commonly seen in impingement like pain. Patient reports she was able to pick up a 2L pop bottle over the weekend, which she had  not been able to do in some time. Patient continues to benefit from skilled therapy, though additional therapy recommended to maximize function.    Rehab Potential Good   Clinical Impairments Affecting Rehab Potential positive indicator: young in age, motivated, also has help at home; Negative indicator: chronic pain, smoker; Patient's clinical presentation is stable as her pain is localized to right shoulder and does not vary much;  PT Frequency 2x / week   PT Duration 6 weeks   PT Treatment/Interventions ADLs/Self Care Home Management;Cryotherapy;Electrical Stimulation;Iontophoresis 4mg /ml Dexamethasone;Moist Heat;Therapeutic exercise;Therapeutic activities;Functional mobility training;Ultrasound;Neuromuscular re-education;Patient/family education;Manual techniques;Taping;Energy conservation;Dry needling;Passive range of motion   PT Next Visit Plan Passive ER, Low rows/single arm pull downs, soft tissue mobilization as indicated.    PT Home Exercise Plan Doorway ER stretch    Consulted and Agree with Plan of Care Patient      Patient will benefit from skilled therapeutic intervention in order to improve the following deficits and impairments:  Hypomobility, Decreased activity tolerance, Decreased strength, Pain, Impaired UE functional use, Decreased range of motion, Improper body mechanics, Postural dysfunction, Impaired flexibility  Visit Diagnosis: Pain in right shoulder  Stiffness of right shoulder, not elsewhere classified  Muscle weakness (generalized)     Problem List Patient Active Problem List   Diagnosis Date Noted  . Menorrhagia with regular cycle 01/23/2016  . Special screening for malignant neoplasms, colon   . Thrombocytosis ([place]) 12/06/2015  . Leukocytosis 12/06/2015  . Vitamin B12 deficiency anemia 12/01/2014  . Blood glucose elevated 12/01/2014  . Avitaminosis D 12/01/2014  . GU infection, trichomonal 12/01/2014  . Irregular bleeding 12/01/2014  . Anemia, iron  deficiency 12/01/2014  . Essential (primary) hypertension 05/03/2009  . Depression, neurotic 02/03/2009   Kerman Passey, PT, DPT    02/27/2016, 10:48 AM  [facility] [address], [place], [address] Phone: [phone]   Fax:  [phone]  Name: Anna Bates MRN: [id number] Date of Birth: 12-06-1965

## 2016-02-29 ENCOUNTER — Ambulatory Visit: Payer: PRIVATE HEALTH INSURANCE | Admitting: Physical Therapy

## 2016-02-29 DIAGNOSIS — M25611 Stiffness of right shoulder, not elsewhere classified: Secondary | ICD-10-CM

## 2016-02-29 DIAGNOSIS — M6281 Muscle weakness (generalized): Secondary | ICD-10-CM

## 2016-02-29 DIAGNOSIS — M25511 Pain in right shoulder: Secondary | ICD-10-CM

## 2016-03-04 ENCOUNTER — Ambulatory Visit: Payer: PRIVATE HEALTH INSURANCE | Admitting: Physical Therapy

## 2016-03-04 DIAGNOSIS — M25511 Pain in right shoulder: Secondary | ICD-10-CM

## 2016-03-04 DIAGNOSIS — M6281 Muscle weakness (generalized): Secondary | ICD-10-CM

## 2016-03-04 DIAGNOSIS — M25611 Stiffness of right shoulder, not elsewhere classified: Secondary | ICD-10-CM

## 2016-03-04 NOTE — Therapy (Signed)
[facility] [address] [place], [address] Phone: [phone]   Fax:  [phone]  Physical Therapy Treatment  Patient Details  Name: Anna Bates MRN: [id number] Date of Birth: Mar 01, 1966 Referring Provider: Dr. Onnie Graham  Encounter Date: 02/29/2016      PT End of Session - 03/04/16 0849    Visit Number 32   Number of Visits 42   Date for PT Re-Evaluation 04/03/16   PT Start Time 1600   PT Stop Time 1645   PT Time Calculation (min) 45 min   Activity Tolerance Patient tolerated treatment well   Behavior During Therapy [facility] for tasks assessed/performed      Past Medical History:  Diagnosis Date  . Anemia     Past Surgical History:  Procedure Laterality Date  . BREAST SURGERY  2002   Breast reduction -both  . CESAREAN SECTION  1990  . CHOLECYSTECTOMY  1991  . COLONOSCOPY WITH PROPOFOL N/A 12/21/2015   Procedure: COLONOSCOPY WITH PROPOFOL;  Surgeon: Lucilla Lame, MD;  Location: [facility];  Service: Endoscopy;  Laterality: N/A;  . HERNIA REPAIR  1980  . REDUCTION MAMMAPLASTY Bilateral 2000  . right shoulder surgery Right 11/07/15   shoulder scope;   . SHOULDER SURGERY Right 05/09/2009    There were no vitals filed for this visit.      Subjective Assessment - 03/04/16 0849    Subjective Patient reports she was able to complete work related tasks yesterday, most pain with repetitive movements, primarily moving pill boxes/capsules.     Pertinent History Personal factors affecting rehab: every day smoker; chronic shoulder pain with multiple injuries;    How long can you sit comfortably? sleeping in a recliner;    How long can you stand comfortably? NA   How long can you walk comfortably? NA   Diagnostic tests no recent scans; goes back to see MD next Tuesday June 13th   Patient Stated Goals "To be able to have full shoulder ROM of RUE; be able to go back to work without limitations;   be able to swim, hang out with family;    Currently in Pain? Yes   Pain Score --  Mild to moderate soreness in shoulder, increases with OH movements.    Pain Location Shoulder   Pain Orientation Right   Pain Descriptors / Indicators Sore   Pain Type Chronic pain;Surgical pain   Pain Onset More than a month ago   Pain Frequency Constant      Soft tissue mobilization provided to anterior shoulder (around where pec-minor inserts) with mild reduction in symptoms reported afterwards   Bilateral ER with green t-band x 12 for 2 sets to increase stamina/strength of peri-scapular musculature.   Assisted patient with scapular upward rotation, ER of scapula during forward elevation with reported relief of symptoms during movement (she had been having pain at roughly 90 degrees forward elevation, pain with abduction as well, which reduced with facilitation) 2 sets x 12 repetitions   Observed patient's mechanics during work related mock activities. Gathering and administering pills, transfers from commode both simulated. Patient continues to favor LUE in transfers, with pill administration, noted to rely excessively on biceps involvement, educated patient that she could rest her elbows on a surface to decrease demand on elbow flexors. She adjusts her body position appropriately for use of computes (uses sit down desk for any prolonged charting).  PT Education - 03/04/16 0849    Education provided Yes   Education Details Modify work station such that she relies on biceps (elbow flexion) as little as possible.    Person(s) Educated Patient   Methods Explanation   Comprehension Verbalized understanding;Returned demonstration             PT Long Term Goals - 02/19/16 1017      PT LONG TERM GOAL #1   Title Patient will be independent in home exercise program to improve strength/mobility for better functional independence with ADLs.    Baseline 44%  disability 05/16/15   Time 4   Period Weeks   Status On-going     PT LONG TERM GOAL #2   Title  Patient will decrease Quick DASH score by > 8 points demonstrating reduced self-reported upper extremity disability.   Baseline 43.18% percieved disability    Time 4   Period Weeks   Status On-going     PT LONG TERM GOAL #3   Title Patient will improve shoulder AROM to > 150 degrees of flexion, scaption, and abduction for improved ability to perform overhead activities.   Baseline 140 degrees flexion, 115 degrees abduction on 01/17/16, able to flex to 168 on 9/11   Time 4   Period Weeks   Status Achieved     PT LONG TERM GOAL #4   Title Patient will report a worst pain of 3/10 on VAS in    right shoulder         to improve tolerance with ADLs and reduced symptoms with activities.    Baseline Worst pain reported 01/17/16 of 7/10. -- Patient reports instances of sharp pain, mostly mild-moderate on 9/11   Time 4   Period Weeks   Status On-going     PT LONG TERM GOAL #5   Title Patient will improve RUE shoulder strength to >4/5 for improved strength with lifting and ADLs to return to PLOF.    Baseline 3+/5 grossly R shoulder on 01/17/16 -- not formally tested on 9/11   Time 4   Period Weeks   Status On-going               Plan - 03/04/16 [id number]    Clinical Impression Statement Patient shows more appropriate ROM in active forward elevation. She continues to be limited with ER in passive testing position. She reports she continues to have pain through ROM above shoulder level, which appears to reduce with PT facilitation of scapula through appropriate mechanics. Assessed patient's body mechanics at work, which appear largely appropriate, though perhaps excessive reliance on biceps in "loaded" positions. Patient continues to demonstrate improvements in ROM, will benefit from additional PT services to address scapular mechanics and peri-scapular strengthening.    Rehab Potential Good   Clinical  Impairments Affecting Rehab Potential positive indicator: young in age, motivated, also has help at home; Negative indicator: chronic pain, smoker; Patient's clinical presentation is stable as her pain is localized to right shoulder and does not vary much;    PT Frequency 2x / week   PT Duration 6 weeks   PT Treatment/Interventions ADLs/Self Care Home Management;Cryotherapy;Electrical Stimulation;Iontophoresis 4mg /ml Dexamethasone;Moist Heat;Therapeutic exercise;Therapeutic activities;Functional mobility training;Ultrasound;Neuromuscular re-education;Patient/family education;Manual techniques;Taping;Energy conservation;Dry needling;Passive range of motion   PT Next Visit Plan Passive ER, Low rows/single arm pull downs, soft tissue mobilization as indicated.    PT Home Exercise Plan Doorway ER stretch    Consulted and Agree with Plan of Care Patient  Patient will benefit from skilled therapeutic intervention in order to improve the following deficits and impairments:  Hypomobility, Decreased activity tolerance, Decreased strength, Pain, Impaired UE functional use, Decreased range of motion, Improper body mechanics, Postural dysfunction, Impaired flexibility  Visit Diagnosis: Pain in right shoulder  Stiffness of right shoulder, not elsewhere classified  Muscle weakness (generalized)     Problem List Patient Active Problem List   Diagnosis Date Noted  . Menorrhagia with regular cycle 01/23/2016  . Special screening for malignant neoplasms, colon   . Thrombocytosis ([place]) 12/06/2015  . Leukocytosis 12/06/2015  . Vitamin B12 deficiency anemia 12/01/2014  . Blood glucose elevated 12/01/2014  . Avitaminosis D 12/01/2014  . GU infection, trichomonal 12/01/2014  . Irregular bleeding 12/01/2014  . Anemia, iron deficiency 12/01/2014  . Essential (primary) hypertension 05/03/2009  . Depression, neurotic 02/03/2009   Kerman Passey, PT, DPT    03/04/2016, 8:50 AM  [facility] [address] [place], [address] Phone: [phone]   Fax:  [phone]  Name: Anna Bates MRN: [id number] Date of Birth: 09/30/1965

## 2016-03-04 NOTE — Therapy (Signed)
[facility] [address] [place], [address] Phone: [phone]   Fax:  [phone]  Physical Therapy Treatment  Patient Details  Name: Anna Bates MRN: [id number] Date of Birth: 08-22-1965 Referring Provider: Dr. Onnie Graham  Encounter Date: 03/04/2016      PT End of Session - 03/04/16 1247    Visit Number 33   Number of Visits 42   Date for PT Re-Evaluation 04/03/16   Authorization Type worker's comp approved 12 visits   PT Start Time 1114   PT Stop Time 1154   PT Time Calculation (min) 40 min   Activity Tolerance Patient tolerated treatment well   Behavior During Therapy [facility] for tasks assessed/performed      Past Medical History:  Diagnosis Date  . Anemia     Past Surgical History:  Procedure Laterality Date  . BREAST SURGERY  2002   Breast reduction -both  . CESAREAN SECTION  1990  . CHOLECYSTECTOMY  1991  . COLONOSCOPY WITH PROPOFOL N/A 12/21/2015   Procedure: COLONOSCOPY WITH PROPOFOL;  Surgeon: Lucilla Lame, MD;  Location: [facility];  Service: Endoscopy;  Laterality: N/A;  . HERNIA REPAIR  1980  . REDUCTION MAMMAPLASTY Bilateral 2000  . right shoulder surgery Right 11/07/15   shoulder scope;   . SHOULDER SURGERY Right 05/09/2009    There were no vitals filed for this visit.      Subjective Assessment - 03/04/16 1117    Subjective Patient reports she continues to have trouble with sleeping on her R arm, repetitive reaching tasks like at the store. She reports overall her pain at rest is much lower than a month ago, reports improving strength and tolerance for OH activities.    Pertinent History Personal factors affecting rehab: every day smoker; chronic shoulder pain with multiple injuries;    How long can you sit comfortably? sleeping in a recliner;    How long can you stand comfortably? NA   How long can you walk comfortably? NA   Diagnostic tests no recent scans; goes  back to see MD next Tuesday June 13th   Patient Stated Goals "To be able to have full shoulder ROM of RUE; be able to go back to work without limitations;  be able to swim, hang out with family;    Currently in Pain? --  Patient reports she wakes up with soreness/stiffness which wears off throughout the day.    Pain Location Shoulder   Pain Orientation Right   Pain Descriptors / Indicators Sore   Pain Type Chronic pain;Surgical pain   Pain Onset More than a month ago   Pain Frequency Constant   Aggravating Factors  Forward repetitive lifting motions.       Standing scapular hugs/punches with red t-band x 15 for 2 sets (noted soreness/stiffness afterwards)   Standing ER stretch in doorway x 15 per set for 2 sets   PNF D1 flexion with red t-band x 12 (PT assisting in upward rotation/ER of the scapula)  X 2 sets -- noted that scapula did not fire initially, appeared delayed. Progressed to PT assisting through early ROM x 10 for 2 sets   PROM into external rotation x 5 minutes for pain control, able to complete through roughly 70 degrees ROM, still lacking 5-10 degrees roughly though more tolerable than previous sessions for her.  PT Education - 03/04/16 1247    Education provided Yes   Education Details HEP to address ER ROM, repetitive reaching stamina/strength    Person(s) Educated Patient   Methods Explanation;Handout   Comprehension Verbalized understanding             PT Long Term Goals - 02/19/16 1017      PT LONG TERM GOAL #1   Title Patient will be independent in home exercise program to improve strength/mobility for better functional independence with ADLs.    Baseline 44% disability 05/16/15   Time 4   Period Weeks   Status On-going     PT LONG TERM GOAL #2   Title  Patient will decrease Quick DASH score by > 8 points demonstrating reduced self-reported upper extremity disability.   Baseline 43.18% percieved disability     Time 4   Period Weeks   Status On-going     PT LONG TERM GOAL #3   Title Patient will improve shoulder AROM to > 150 degrees of flexion, scaption, and abduction for improved ability to perform overhead activities.   Baseline 140 degrees flexion, 115 degrees abduction on 01/17/16, able to flex to 168 on 9/11   Time 4   Period Weeks   Status Achieved     PT LONG TERM GOAL #4   Title Patient will report a worst pain of 3/10 on VAS in    right shoulder         to improve tolerance with ADLs and reduced symptoms with activities.    Baseline Worst pain reported 01/17/16 of 7/10. -- Patient reports instances of sharp pain, mostly mild-moderate on 9/11   Time 4   Period Weeks   Status On-going     PT LONG TERM GOAL #5   Title Patient will improve RUE shoulder strength to >4/5 for improved strength with lifting and ADLs to return to PLOF.    Baseline 3+/5 grossly R shoulder on 01/17/16 -- not formally tested on 9/11   Time 4   Period Weeks   Status On-going               Plan - 03/04/16 1248    Clinical Impression Statement Patient demonstrates improving tolerance for most activities with RUE. She continues to have pain while sleeping on it, or repetitive reaching tasks. She has significantly improved her resting pain levels and ER PROM (roughly 5 degrees from desired range now). She is likely limited now by muscular weakness/fatigue of peri-scapular musculature as well as poor scapular movement initially in the range.    Rehab Potential Good   Clinical Impairments Affecting Rehab Potential positive indicator: young in age, motivated, also has help at home; Negative indicator: chronic pain, smoker; Patient's clinical presentation is stable as her pain is localized to right shoulder and does not vary much;    PT Frequency 2x / week   PT Duration 6 weeks   PT Treatment/Interventions ADLs/Self Care Home Management;Cryotherapy;Electrical Stimulation;Iontophoresis 4mg /ml Dexamethasone;Moist  Heat;Therapeutic exercise;Therapeutic activities;Functional mobility training;Ultrasound;Neuromuscular re-education;Patient/family education;Manual techniques;Taping;Energy conservation;Dry needling;Passive range of motion   PT Next Visit Plan Passive ER, Low rows/single arm pull downs, soft tissue mobilization as indicated.    PT Home Exercise Plan Doorway ER stretch    Consulted and Agree with Plan of Care Patient      Patient will benefit from skilled therapeutic intervention in order to improve the following deficits and impairments:  Hypomobility, Decreased activity tolerance, Decreased strength, Pain, Impaired UE functional use, Decreased  range of motion, Improper body mechanics, Postural dysfunction, Impaired flexibility  Visit Diagnosis: Stiffness of right shoulder, not elsewhere classified  Pain in right shoulder  Muscle weakness (generalized)     Problem List Patient Active Problem List   Diagnosis Date Noted  . Menorrhagia with regular cycle 01/23/2016  . Special screening for malignant neoplasms, colon   . Thrombocytosis ([place]) 12/06/2015  . Leukocytosis 12/06/2015  . Vitamin B12 deficiency anemia 12/01/2014  . Blood glucose elevated 12/01/2014  . Avitaminosis D 12/01/2014  . GU infection, trichomonal 12/01/2014  . Irregular bleeding 12/01/2014  . Anemia, iron deficiency 12/01/2014  . Essential (primary) hypertension 05/03/2009  . Depression, neurotic 02/03/2009   Kerman Passey, PT, DPT    03/04/2016, 12:50 PM  [facility] [address] [place], [address] Phone: [phone]   Fax:  [phone]  Name: Anna Bates MRN: [id number] Date of Birth: [birth year]

## 2016-03-04 NOTE — Patient Instructions (Addendum)
Standing scapular hugs/punches with red t-band x 15 for 2 sets (noted soreness/stiffness afterwards)   Standing ER stretch in doorway x 15 per set for 2 sets   PNF D1 flexion with red t-band x 12 (PT assisting in upward rotation/ER of the scapula)  X 2 sets -- noted that scapula did not fire initially, appeared delayed. Progressed to PT assisting through early ROM x 10 for 2 sets

## 2016-03-06 ENCOUNTER — Ambulatory Visit: Payer: PRIVATE HEALTH INSURANCE | Admitting: Physical Therapy

## 2016-03-06 DIAGNOSIS — M6281 Muscle weakness (generalized): Secondary | ICD-10-CM

## 2016-03-06 DIAGNOSIS — M25511 Pain in right shoulder: Secondary | ICD-10-CM

## 2016-03-06 DIAGNOSIS — M25611 Stiffness of right shoulder, not elsewhere classified: Secondary | ICD-10-CM

## 2016-03-06 NOTE — Therapy (Signed)
[facility] [address] [place], [address] Phone: [phone]   Fax:  [phone]  Physical Therapy Treatment  Patient Details  Name: Anna Bates MRN: [id number] Date of Birth: Oct 02, 1965 Referring Provider: Dr. Onnie Graham  Encounter Date: 03/06/2016      PT End of Session - 03/06/16 1038    Visit Number 34   Number of Visits 42   Date for PT Re-Evaluation 04/03/16   Authorization Type worker's comp approved 12 visits   PT Start Time 0935   PT Stop Time 1017   PT Time Calculation (min) 42 min   Activity Tolerance Patient tolerated treatment well   Behavior During Therapy [facility] for tasks assessed/performed      Past Medical History:  Diagnosis Date  . Anemia     Past Surgical History:  Procedure Laterality Date  . BREAST SURGERY  2002   Breast reduction -both  . CESAREAN SECTION  1990  . CHOLECYSTECTOMY  1991  . COLONOSCOPY WITH PROPOFOL N/A 12/21/2015   Procedure: COLONOSCOPY WITH PROPOFOL;  Surgeon: Lucilla Lame, MD;  Location: [facility];  Service: Endoscopy;  Laterality: N/A;  . HERNIA REPAIR  1980  . REDUCTION MAMMAPLASTY Bilateral 2000  . right shoulder surgery Right 11/07/15   shoulder scope;   . SHOULDER SURGERY Right 05/09/2009    There were no vitals filed for this visit.      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 She reports that this lasted for several hours, but reduced to baseline within roughly 24 hours. She reports this is a much faster recovery time than she had been experiencing.    Pertinent History Personal factors affecting rehab: every day smoker; chronic shoulder pain with multiple injuries;    How long can you sit comfortably? sleeping in a  recliner;    How long can you stand comfortably? NA   How long can you walk comfortably? NA   Diagnostic tests no recent scans; goes back to see MD next Tuesday June 13th   Patient Stated Goals "To be able to have full shoulder ROM of RUE; be able to go back to work without limitations;  be able to swim, hang out with family;    Currently in Pain? Yes   Pain Score --  Again reports mild to moderate soreness, exacerbated by active biceps movement.   Pain Orientation Right   Pain Descriptors / Indicators Sore   Pain Type Chronic pain;Surgical pain   Pain Onset More than a month ago   Pain Frequency Constant   Aggravating Factors  Lifting movements    Pain Relieving Factors Rest, pain medication, ice       Observed patient with lifting task with box (~5#) x 4, added weight to 10#, added weight to 15#. Educated to bend through knees, keep elbows extended which was not painful for her, but she "could feel it" once 15# total. Unilateral carries with 2# DB, 5#, 10# DBs x 30' (with 10# became overtly painful for her), regressed to 7# DB for same distance with mild pulling reported, cued to squeeze shoulder blades which reduced symptoms.     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 Used towels to mimic sliding surface.  Patient educated to put pillows behind patient in sidelying to stabilize, patient to hold pillow when she is working to reduce patient's tendency to return to supine from sidelying. Patient instructed to first turn through knees (which she does) and finally move her body position down towards areas to be wiped. 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                          PT Education - 03/06/16 1038    Education provided Yes   Education Details Use of pillows and sheets for  positioning, rolling patient to reduce demand on R biceps.    Person(s) Educated Patient   Methods Explanation;Demonstration;Tactile cues;Verbal cues   Comprehension Verbal cues required;Tactile cues required;Returned demonstration;Verbalized understanding             PT Long Term Goals - 02/19/16 1017      PT LONG TERM GOAL #1   Title Patient will be independent in home exercise program to improve strength/mobility for better functional independence with ADLs.    Baseline 44% disability 05/16/15   Time 4   Period Weeks   Status On-going     PT LONG TERM GOAL #2   Title  Patient will decrease Quick DASH score by > 8 points demonstrating reduced self-reported upper extremity disability.   Baseline 43.18% percieved disability    Time 4   Period Weeks   Status On-going     PT LONG TERM GOAL #3   Title Patient will improve shoulder AROM to > 150 degrees of flexion, scaption, and abduction for improved ability to perform overhead activities.   Baseline 140 degrees flexion, 115 degrees abduction on 01/17/16, able to flex to 168 on 9/11   Time 4   Period Weeks   Status Achieved     PT LONG TERM GOAL #4   Title Patient will report a worst pain of 3/10 on VAS in    right shoulder         to improve tolerance with ADLs and reduced symptoms with activities.    Baseline Worst pain reported 01/17/16 of 7/10. -- Patient reports instances of sharp pain, mostly mild-moderate on 9/11   Time 4   Period Weeks   Status On-going     PT LONG TERM GOAL #5   Title Patient will improve RUE shoulder strength to >4/5 for improved strength with lifting and ADLs to return to PLOF.    Baseline 3+/5 grossly R shoulder on 01/17/16 -- not formally tested on 9/11   Time 4   Period Weeks   Status On-going               Plan - 03/06/16 1039    Clinical Impression Statement Patient observed to have increased pain in RUE with activation/utilization of R biceps with job related activities. Educated to  lift with elbows extended, objects close to her COM to reduce lever arm of gravity and utilization of anterior deltoid/biceps. Patient also educated to use pillows for positioning in bed and bed mobility to reduce load on R biceps. Patient reports she is going to finish therapy next week, will provide final HEP to maintain progress.    Rehab Potential Good   Clinical Impairments Affecting Rehab Potential positive indicator: young in age, motivated, also has help at home; Negative indicator: chronic pain,  smoker; Patient's clinical presentation is stable as her pain is localized to right shoulder and does not vary much;    PT Frequency 2x / week   PT Duration 6 weeks   PT Treatment/Interventions ADLs/Self Care Home Management;Cryotherapy;Electrical Stimulation;Iontophoresis 4mg /ml Dexamethasone;Moist Heat;Therapeutic exercise;Therapeutic activities;Functional mobility training;Ultrasound;Neuromuscular re-education;Patient/family education;Manual techniques;Taping;Energy conservation;Dry needling;Passive range of motion   PT Next Visit Plan Provide HEP for maintenance.    PT Home Exercise Plan Doorway ER stretch    Consulted and Agree with Plan of Care Patient      Patient will benefit from skilled therapeutic intervention in order to improve the following deficits and impairments:  Hypomobility, Decreased activity tolerance, Decreased strength, Pain, Impaired UE functional use, Decreased range of motion, Improper body mechanics, Postural dysfunction, Impaired flexibility  Visit Diagnosis: Stiffness of right shoulder, not elsewhere classified  Pain in right shoulder  Muscle weakness (generalized)     Problem List Patient Active Problem List   Diagnosis Date Noted  . Menorrhagia with regular cycle 01/23/2016  . Special screening for malignant neoplasms, colon   . Thrombocytosis ([place]) 12/06/2015  . Leukocytosis 12/06/2015  . Vitamin B12 deficiency anemia 12/01/2014  . Blood glucose  elevated 12/01/2014  . Avitaminosis D 12/01/2014  . GU infection, trichomonal 12/01/2014  . Irregular bleeding 12/01/2014  . Anemia, iron deficiency 12/01/2014  . Essential (primary) hypertension 05/03/2009  . Depression, neurotic 02/03/2009   Kerman Passey, PT, DPT    03/06/2016, 1:18 PM  [facility] [address], [place], [address] Phone: [phone]   Fax:  [phone]  Name: Anna Bates MRN: [id number] Date of Birth: 04-17-1966

## 2016-03-11 ENCOUNTER — Ambulatory Visit: Payer: PRIVATE HEALTH INSURANCE | Attending: Orthopedic Surgery

## 2016-03-11 ENCOUNTER — Encounter: Payer: Self-pay | Admitting: Physical Therapy

## 2016-03-11 DIAGNOSIS — M25511 Pain in right shoulder: Secondary | ICD-10-CM | POA: Insufficient documentation

## 2016-03-11 DIAGNOSIS — M6281 Muscle weakness (generalized): Secondary | ICD-10-CM | POA: Diagnosis present

## 2016-03-11 DIAGNOSIS — M25611 Stiffness of right shoulder, not elsewhere classified: Secondary | ICD-10-CM

## 2016-03-11 DIAGNOSIS — G8929 Other chronic pain: Secondary | ICD-10-CM | POA: Insufficient documentation

## 2016-03-11 NOTE — Therapy (Signed)
[facility] [address] [place], [address] Phone: [phone]   Fax:  [phone]  Physical Therapy Treatment/Discharge  Patient Details  Name: NEERA TENG MRN: [id number] Date of Birth: [birth year] Referring Provider: Dr. Onnie Graham  Encounter Date: 03/11/2016      PT End of Session - 03/11/16 0850    Visit Number 35   Number of Visits 42   Date for PT Re-Evaluation 04/03/16   Authorization Type worker's comp approved 12 visits   PT Start Time 0850   PT Stop Time 0920   PT Time Calculation (min) 30 min   Activity Tolerance Patient tolerated treatment well   Behavior During Therapy [facility] for tasks assessed/performed      Past Medical History:  Diagnosis Date  . Anemia     Past Surgical History:  Procedure Laterality Date  . BREAST SURGERY  2002   Breast reduction -both  . CESAREAN SECTION  1990  . CHOLECYSTECTOMY  1991  . COLONOSCOPY WITH PROPOFOL N/A 12/21/2015   Procedure: COLONOSCOPY WITH PROPOFOL;  Surgeon: Lucilla Lame, MD;  Location: [facility];  Service: Endoscopy;  Laterality: N/A;  . HERNIA REPAIR  1980  . REDUCTION MAMMAPLASTY Bilateral 2000  . right shoulder surgery Right 11/07/15   shoulder scope;   . SHOULDER SURGERY Right 05/09/2009    There were no vitals filed for this visit.      Subjective Assessment - 03/11/16 0849    Subjective Pt reports she is doing well at this time. She is having some R shoulder pain at this time which is normal when she wakes up. HEP is going well without questions or concerns. 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 This aggravated her R shoulder some. She has an appointment with Dr. Onnie Graham next week. Pt reports she is ready to be finished with therapy at this time.    Pertinent History Personal factors affecting rehab: every day smoker; chronic shoulder pain with multiple  injuries;    How long can you sit comfortably? sleeping in a recliner;    How long can you stand comfortably? NA   How long can you walk comfortably? NA   Diagnostic tests no recent scans; goes back to see MD next Tuesday June 13th   Patient Stated Goals "To be able to have full shoulder ROM of RUE; be able to go back to work without limitations;  be able to swim, hang out with family;    Currently in Pain? Yes   Pain Score 2    Pain Location Shoulder   Pain Orientation Right   Pain Descriptors / Indicators Sore   Pain Type Chronic pain;Surgical pain   Pain Radiating Towards Anterior shoulder   Pain Onset More than a month ago   Pain Frequency Constant   Aggravating Factors  Lifting movements   Pain Relieving Factors Rest, pain medication, ice   Effect of Pain on Daily Activities decreased mobility   Multiple Pain Sites No       TREATMENT  Ther-ex Scored and discussed results of quick DASH with patient; Reviewed and updated goals with patient; AROM/PROM measurements obtained from patient for flexion and abduction, AROM=PROM flexion: 155 degrees, abduction: 155 degrees, pain reported at end range; MMT performed with patient: 4-/5 R shoulder flexion and abduction, 4-/5 R shoulder ER, doesn't tolerate MMT for ER due to pain, strength  is limited due to pain. Pt denies weakness as primary limitation; PROM R shoulder ER stretch 5 second hold x 3; Gentle PROM R shoulder abduction with oscillation; Reviewed HEP with patient to ensure that pt is able to perform correctly; Education regarding patient care and protecting R shoulder; Pt defers further exercise or manual therapy on this date; Cold pack applied to R shoulder (unbilled);                    PT Education - 03/11/16 0849    Education provided Yes   Education Details HEP reinforced, plan of care and discharge   Person(s) Educated Patient   Methods Explanation   Comprehension Verbalized understanding;Returned  demonstration             PT Long Term Goals - 03/11/16 0850      PT LONG TERM GOAL #1   Title Patient will be independent in home exercise program to improve strength/mobility for better functional independence with ADLs.    Baseline 03/11/16: reviewed and confirmed with patient   Time 4   Period Weeks   Status Achieved     PT LONG TERM GOAL #2   Title  Patient will decrease Quick DASH score by > 8 points demonstrating reduced self-reported upper extremity disability.   Baseline 43.18% percieved disability, 03/11/16: 31.81%   Time 4   Period Weeks   Status Achieved     PT LONG TERM GOAL #3   Title Patient will improve shoulder AROM to > 150 degrees of flexion, scaption, and abduction for improved ability to perform overhead activities.   Baseline 140 degrees flexion, 115 degrees abduction on 01/17/16, able to flex to 168 on 9/11; 03/11/16: active=passive 155 degrees flexion, 155 degrees abduction   Time 4   Period Weeks   Status Achieved     PT LONG TERM GOAL #4   Title Patient will report a worst pain of 3/10 on VAS in right shoulder to improve tolerance with ADLs and reduced symptoms with activities.    Baseline Worst pain reported 01/17/16 of 7/10; instances of sharp pain, mostly mild-moderate on 9/11; 03/11/16: 6/10 worst pain   Time 4   Period Weeks   Status Not Met     PT LONG TERM GOAL #5   Title Patient will improve RUE shoulder strength to >4/5 for improved strength with lifting and ADLs to return to PLOF.    Baseline 3+/5 grossly R shoulder on 01/17/16 -- not formally tested on 9/11; 03/11/16: 4-/5 R shoulder flexion/abduction, 4-/5 R shoulder IR, doesn't tolerate ER MMT;    Time 4   Period Weeks   Status On-going               Plan - 03/11/16 0850    Clinical Impression Statement Pt would like for today to be her last physical therapy session. She believes that she has made as much progress as possible. She continues to report end range pain with R shoulder  flexion, abduction, and ER however AROM if functional for all household and work related responsibilities. Her R shoulder strength is still limited secondary to pain currently. 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 Pt will be discharged at this time. She has a follow-up appt next week with her orthopedic surgeon.    Rehab Potential Good   Clinical Impairments Affecting Rehab Potential positive indicator: young in  age, motivated, also has help at home; Negative indicator: chronic pain, smoker; Patient's clinical presentation is stable as her pain is localized to right shoulder and does not vary much;    PT Frequency 2x / week   PT Duration 6 weeks   PT Treatment/Interventions ADLs/Self Care Home Management;Cryotherapy;Electrical Stimulation;Iontophoresis 87m/ml Dexamethasone;Moist Heat;Therapeutic exercise;Therapeutic activities;Functional mobility training;Ultrasound;Neuromuscular re-education;Patient/family education;Manual techniques;Taping;Energy conservation;Dry needling;Passive range of motion   PT Next Visit Plan Discharge   PT Home Exercise Plan Doorway ER stretch    Consulted and Agree with Plan of Care Patient      Patient will benefit from skilled therapeutic intervention in order to improve the following deficits and impairments:  Hypomobility, Decreased activity tolerance, Decreased strength, Pain, Impaired UE functional use, Decreased range of motion, Improper body mechanics, Postural dysfunction, Impaired flexibility  Visit Diagnosis: Stiffness of right shoulder, not elsewhere classified  Muscle weakness (generalized)     Problem List Patient Active Problem List   Diagnosis Date Noted  . Menorrhagia with regular cycle 01/23/2016  . Special screening for malignant neoplasms, colon   . [place] 12/06/2015  . Leukocytosis 12/06/2015  . Vitamin B12  deficiency anemia 12/01/2014  . Blood glucose elevated 12/01/2014  . Avitaminosis D 12/01/2014  . GU infection, trichomonal 12/01/2014  . Irregular bleeding 12/01/2014  . Anemia, iron deficiency 12/01/2014  . Essential (primary) hypertension 05/03/2009  . Depression, neurotic 02/03/2009   JPhillips GroutPT, DPT   Huprich,Jason 03/11/2016, 10:53 AM  [facility] [address] [place] [address]Phone: [phone]  Fax:  [phone] Name: KBRENNAN KARAMMRN: [id number] of Birth: 2Apr 20, 1967

## 2016-03-18 ENCOUNTER — Encounter: Payer: Self-pay | Admitting: Hematology and Oncology

## 2016-03-19 ENCOUNTER — Ambulatory Visit: Payer: Self-pay

## 2016-03-19 ENCOUNTER — Other Ambulatory Visit: Payer: Self-pay

## 2016-03-19 ENCOUNTER — Ambulatory Visit: Payer: Self-pay | Admitting: Hematology and Oncology

## 2016-04-08 ENCOUNTER — Telehealth: Payer: Self-pay | Admitting: *Deleted

## 2016-04-08 ENCOUNTER — Inpatient Hospital Stay: Payer: 59 | Attending: Hematology and Oncology

## 2016-04-08 ENCOUNTER — Encounter: Payer: Self-pay | Admitting: Hematology and Oncology

## 2016-04-08 ENCOUNTER — Inpatient Hospital Stay: Payer: 59

## 2016-04-08 ENCOUNTER — Inpatient Hospital Stay (HOSPITAL_BASED_OUTPATIENT_CLINIC_OR_DEPARTMENT_OTHER): Payer: 59 | Admitting: Hematology and Oncology

## 2016-04-08 ENCOUNTER — Other Ambulatory Visit: Payer: Self-pay | Admitting: *Deleted

## 2016-04-08 VITALS — BP 138/88 | HR 79 | Temp 97.1°F | Resp 18 | Wt 235.0 lb

## 2016-04-08 DIAGNOSIS — K5730 Diverticulosis of large intestine without perforation or abscess without bleeding: Secondary | ICD-10-CM | POA: Insufficient documentation

## 2016-04-08 DIAGNOSIS — N920 Excessive and frequent menstruation with regular cycle: Secondary | ICD-10-CM

## 2016-04-08 DIAGNOSIS — D500 Iron deficiency anemia secondary to blood loss (chronic): Secondary | ICD-10-CM | POA: Insufficient documentation

## 2016-04-08 DIAGNOSIS — Z87891 Personal history of nicotine dependence: Secondary | ICD-10-CM

## 2016-04-08 DIAGNOSIS — Z9049 Acquired absence of other specified parts of digestive tract: Secondary | ICD-10-CM | POA: Insufficient documentation

## 2016-04-08 DIAGNOSIS — Z79899 Other long term (current) drug therapy: Secondary | ICD-10-CM | POA: Diagnosis not present

## 2016-04-08 DIAGNOSIS — D75839 Thrombocytosis, unspecified: Secondary | ICD-10-CM

## 2016-04-08 DIAGNOSIS — E538 Deficiency of other specified B group vitamins: Secondary | ICD-10-CM

## 2016-04-08 DIAGNOSIS — K648 Other hemorrhoids: Secondary | ICD-10-CM | POA: Insufficient documentation

## 2016-04-08 DIAGNOSIS — D519 Vitamin B12 deficiency anemia, unspecified: Secondary | ICD-10-CM

## 2016-04-08 DIAGNOSIS — D509 Iron deficiency anemia, unspecified: Secondary | ICD-10-CM | POA: Diagnosis not present

## 2016-04-08 DIAGNOSIS — D473 Essential (hemorrhagic) thrombocythemia: Secondary | ICD-10-CM

## 2016-04-08 LAB — FERRITIN: Ferritin: 5 ng/mL — ABNORMAL LOW (ref 11–307)

## 2016-04-08 LAB — CBC WITH DIFFERENTIAL/PLATELET
Basophils Absolute: 0.1 10*3/uL (ref 0–0.1)
Basophils Relative: 1 %
Eosinophils Absolute: 0.3 10*3/uL (ref 0–0.7)
Eosinophils Relative: 4 %
HCT: 39.3 % (ref 35.0–47.0)
Hemoglobin: 13.2 g/dL (ref 12.0–16.0)
Lymphocytes Relative: 35 %
Lymphs Abs: 2.6 10*3/uL (ref 1.0–3.6)
MCH: 28.9 pg (ref 26.0–34.0)
MCHC: 33.7 g/dL (ref 32.0–36.0)
MCV: 85.9 fL (ref 80.0–100.0)
Monocytes Absolute: 0.5 10*3/uL (ref 0.2–0.9)
Monocytes Relative: 7 %
Neutro Abs: 3.8 10*3/uL (ref 1.4–6.5)
Neutrophils Relative %: 53 %
Platelets: 602 10*3/uL — ABNORMAL HIGH (ref 150–440)
RBC: 4.57 MIL/uL (ref 3.80–5.20)
RDW: 22.6 % — ABNORMAL HIGH (ref 11.5–14.5)
WBC: 7.3 10*3/uL (ref 3.6–11.0)

## 2016-04-08 NOTE — Progress Notes (Signed)
Patient states she has slacked off her iron and B-12.  States she is having a lot of stomach issues especially early in the morning.  Otherwise, no complaints.

## 2016-04-08 NOTE — Telephone Encounter (Signed)
-----   Message from Lequita Asal, MD sent at 04/08/2016  2:28 PM EDT ----- Regarding: Please call patient  Ferritin 5!  Continue oral iron with OJ or vitamin C. Follow-up with GYN  M ----- Message ----- From: Interface, Lab In [place] Sent: 04/08/2016  11:44 AM To: Lequita Asal, MD

## 2016-04-08 NOTE — Telephone Encounter (Signed)
Called patient to inform her Ferritin is 5.  MD recommends she continue oral iron with orange juice or vitamin C.  She should follow up with GYN.  Verbalized understanding.

## 2016-04-08 NOTE — Progress Notes (Signed)
[facility] day:  04/08/16  Chief Complaint: Anna Bates is a 50 y.o. female with iron deficiency anemia and reactive thrombocytosis who is seen for 2 month assessment on oral iron.  HPI:  The patient was last seen in the hematology clinic on 01/23/2016.  At that time, work-up was reviewed.  She had heavy menses causing iron deficiency anemia.  She was on oral iron.  She denied any complaint.  Exam was unremarkable.  Hematocrit was 36.3, hemoglobin 11.8, MCV 76.4, and platelets 904,000.  Ferritin was 5.   We discussed continuation of oral iron until her ferritin was 100.  We also discussed IV iron.  CBC on 02/14/2016 included a hematocrit of 39.2, hemoglobin 12.9, MCV 80.6, platelets 664,000, white count 9300 with an [place] of 5800.  Ferritin was 30.  Von Willebrand panel appeared normal with a Factor VIII level of 47% (57-163%), ristocetin cofactor 51% (50%-200%) and von Willebrand's antigen 68% (50%-200%).  During the interim, there has been some controversy whether she has fibroids. She slacked off her oral iron. She is back to work 3 days per week. Menses are back on schedule.  Today is day 3.  She is having heavy flow.   Past Medical History:  Diagnosis Date  . Anemia     Past Surgical History:  Procedure Laterality Date  . BREAST SURGERY  2002   Breast reduction -both  . CESAREAN SECTION  1990  . CHOLECYSTECTOMY  1991  . COLONOSCOPY WITH PROPOFOL N/A 12/21/2015   Procedure: COLONOSCOPY WITH PROPOFOL;  Surgeon: Lucilla Lame, MD;  Location: [facility];  Service: Endoscopy;  Laterality: N/A;  . HERNIA REPAIR  1980  . REDUCTION MAMMAPLASTY Bilateral 2000  . right shoulder surgery Right 11/07/15   shoulder scope;   . SHOULDER SURGERY Right 05/09/2009    Family History  Problem Relation Age of Onset  . Hypertension Other   . Diabetes Other     Diabetes Mellitus type 2  . Diabetes Mother     Social History:  reports that  she has been smoking.  She has a 17.50 pack-year smoking history. She does not have any smokeless tobacco history on file. She reports that she drinks about 1.2 oz of alcohol per week . She reports that she does not use drugs. She lives in [place].  She works as a Corporate treasurer.  She has 4 children.  Her daughter has heavy menses.  The patient is alone today.  Allergies:  Allergies  Allergen Reactions  . Scallops [Shellfish Allergy] Nausea And Vomiting    Current Medications: Current Outpatient Prescriptions  Medication Sig Dispense Refill  . acetaminophen (TYLENOL) 500 MG tablet Take 500 mg by mouth every 6 (six) hours as needed.    . cyanocobalamin 2000 MCG tablet Take 2,000 mcg by mouth daily.    . ferrous sulfate 325 (65 FE) MG tablet Take 325 mg by mouth daily with breakfast.    . Vitamin D, Ergocalciferol, (DRISDOL) 50000 units CAPS capsule Take 1 capsule (50,000 Units total) by mouth every 7 (seven) days. 12 capsule 1  . diazepam (VALIUM) 5 MG tablet Take 5 mg by mouth every 6 (six) hours as needed for anxiety (1/2- 1 tab).     No current facility-administered medications for this visit.     Review of Systems:  GENERAL:  Feels "ok".  No fevers or sweats.  Weight loss of 5 pounds. PERFORMANCE STATUS (ECOG):  0 HEENT:  No  visual changes, runny nose, sore throat, mouth sores or tenderness. Lungs: No shortness of breath or cough.  No hemoptysis. Cardiac:  No chest pain, palpitations, orthopnea, or PND. GI:  No nausea, vomiting, diarrhea, constipation, melena or hematochezia. GU:  Heavy menses (seeing gynecologyI).  No urgency, frequency, dysuria, or hematuria. Musculoskeletal:  No back pain.  Right shoulder rehab.  No muscle tenderness. Extremities:  No pain or swelling. Skin:  No rashes or skin changes. Neuro:  No headache, numbness or weakness, balance or coordination issues. Endocrine:  No diabetes, thyroid issues, hot flashes or night sweats. Psych:  No mood changes, depression or  anxiety. Pain:  No focal pain. Review of systems:  All other systems reviewed and found to be negative.  Physical Exam: 240 Blood pressure 138/88, pulse 79, temperature 97.1 F (36.2 C), temperature source Tympanic, resp. rate 18, weight 235 lb 0.2 oz (106.6 kg). GENERAL:  Well developed, well nourished, woman sitting comfortably in the exam room in no acute distress. MENTAL STATUS:  Alert and oriented to person, place and time. HEAD:  Brown hair with slight graying.  Normocephalic, atraumatic, face symmetric, no Cushingoid features. EYES:  Glasses.  Brown eyes.  Pupils equal round and reactive to light and accomodation.  No conjunctivitis or scleral icterus. ENT:  Oropharynx clear without lesion.  Tongue normal. Mucous membranes moist.  RESPIRATORY:  Clear to auscultation without rales, wheezes or rhonchi. CARDIOVASCULAR:  Regular rate and rhythm without murmur, rub or gallop. ABDOMEN:  Soft, non-tender, with active bowel sounds, and no appreciable hepatosplenomegaly.  No masses. SKIN:  No rashes, ulcers or lesions. EXTREMITIES: No edema, no skin discoloration or tenderness.  No palpable cords. LYMPH NODES: No palpable cervical, supraclavicular, axillary or inguinal adenopathy  NEUROLOGICAL: Unremarkable. PSYCH:  Appropriate.   Appointment on 04/08/2016  Component Date Value Ref Range Status  . WBC 04/08/2016 7.3  3.6 - 11.0 K/uL Final  . RBC 04/08/2016 4.57  3.80 - 5.20 MIL/uL Final  . Hemoglobin 04/08/2016 13.2  12.0 - 16.0 g/dL Final  . HCT 04/08/2016 39.3  35.0 - 47.0 % Final  . MCV 04/08/2016 85.9  80.0 - 100.0 fL Final  . MCH 04/08/2016 28.9  26.0 - 34.0 pg Final  . MCHC 04/08/2016 33.7  32.0 - 36.0 g/dL Final  . RDW 04/08/2016 22.6* 11.5 - 14.5 % Final  . Platelets 04/08/2016 602* 150 - 440 K/uL Final  . Neutrophils Relative % 04/08/2016 53  % Final  . Neutro Abs 04/08/2016 3.8  1.4 - 6.5 K/uL Final  . Lymphocytes Relative 04/08/2016 35  % Final  . Lymphs Abs 04/08/2016  2.6  1.0 - 3.6 K/uL Final  . Monocytes Relative 04/08/2016 7  % Final  . Monocytes Absolute 04/08/2016 0.5  0.2 - 0.9 K/uL Final  . Eosinophils Relative 04/08/2016 4  % Final  . Eosinophils Absolute 04/08/2016 0.3  0 - 0.7 K/uL Final  . Basophils Relative 04/08/2016 1  % Final  . Basophils Absolute 04/08/2016 0.1  0 - 0.1 K/uL Final    Assessment:  Anna Bates is a 50 y.o. female with iron deficiency anemia and reactive thrombocytosis.  Platelet count has ranged between 435,000 - 640,000.  Platelet count was 985,000 on 12/05/2015.  JAK2 and BCR-ABL were negative in 04/2009.  She is on oral iron.  She has heavy menses.  She is being evaluated by gynecology.  Colonoscopy on 12/21/2015 revealed diverticulosis in the entire colon and non-bleeding internal hemorrhoids.   Von Willebrand panel  was normal on 02/14/2016.  Diet is modest.  She has ice pica.  She has B12 deficiency initially documented in 2007.  She was previously on B12 injections (stopped on 04/26/2009).  B12 was 159 (low) on 12/05/2015.  She started oral B12 on 12/08/2015.  Symptomatically, she is doing well.  Exam is unremarkable.  She has taken her oral iron sporadically.  Hematocrit is 39.3.  Ferritin is 5.  Plan: 1.  Labs today:  CBC with diff, ferritin. 2.  Encourage oral iron.  Discuss IV iron if unable to replete iron stores.  Ferritin goal is 100. 3.  Follow-up with gynecology. 4.  RTC in 6 weeks for CBC and ferritin. 5.  RTC in 3 months for MD assessment and labs (CBC, ferritin- day before) +/- Venofer.   Lequita Asal, MD  04/08/2016, 12:26 PM

## 2016-04-09 ENCOUNTER — Ambulatory Visit: Payer: PRIVATE HEALTH INSURANCE | Admitting: Physical Therapy

## 2016-04-09 DIAGNOSIS — M25511 Pain in right shoulder: Secondary | ICD-10-CM

## 2016-04-09 DIAGNOSIS — M25611 Stiffness of right shoulder, not elsewhere classified: Secondary | ICD-10-CM | POA: Diagnosis not present

## 2016-04-09 DIAGNOSIS — M6281 Muscle weakness (generalized): Secondary | ICD-10-CM

## 2016-04-09 DIAGNOSIS — G8929 Other chronic pain: Secondary | ICD-10-CM

## 2016-04-09 NOTE — Patient Instructions (Signed)
PROM - ER  OMEGA x 9 at 5#, x6 @ 10#, x 10# x 12, x 15# for 8 repetitions -- cuing for scap retraction and elbows at lower abduction angle. (15# was too much at this time)   Prone shoulder extensions x 2# for 12 repetitions x 2 sets   Prone shoulder rows/retractions x 12 repetitions with 4# DB x 2 sets

## 2016-04-09 NOTE — Therapy (Addendum)
[facility] [address], [place], [address] Phone: [phone]   Fax:  [phone]  Physical Therapy Treatment  Patient Details  Name: Anna Bates MRN: [id number] Date of Birth: 1965-07-26 Referring Provider: Dr. Onnie Graham  Encounter Date: 04/09/2016      PT End of Session - 04/09/16 1042    Visit Number 36   Number of Visits 42   Date for PT Re-Evaluation 05/21/16   Authorization Type worker's comp approved 12 visits   PT Start Time 0805   PT Stop Time 0845   PT Time Calculation (min) 40 min   Activity Tolerance Patient tolerated treatment well   Behavior During Therapy [facility] for tasks assessed/performed      Past Medical History:  Diagnosis Date  . Anemia     Past Surgical History:  Procedure Laterality Date  . BREAST SURGERY  2002   Breast reduction -both  . CESAREAN SECTION  1990  . CHOLECYSTECTOMY  1991  . COLONOSCOPY WITH PROPOFOL N/A 12/21/2015   Procedure: COLONOSCOPY WITH PROPOFOL;  Surgeon: Lucilla Lame, MD;  Location: [facility];  Service: Endoscopy;  Laterality: N/A;  . HERNIA REPAIR  1980  . REDUCTION MAMMAPLASTY Bilateral 2000  . right shoulder surgery Right 11/07/15   shoulder scope;   . SHOULDER SURGERY Right 05/09/2009    There were no vitals filed for this visit.      Subjective Assessment - 04/09/16 0806    Subjective Patient reports small repetitive lifting is not bad anymore. 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 She is now working 8 hour days, rather than 12. She is able to carry empty food trays, but has trouble with the weight of new meal trays.    Pertinent History Personal factors affecting rehab: every day smoker; chronic shoulder pain with multiple injuries;    How long can you sit comfortably? sleeping in a recliner;    How long  can you stand comfortably? NA   How long can you walk comfortably? NA   Diagnostic tests no recent scans; goes back to see MD next Tuesday June 13th   Patient Stated Goals "To be able to have full shoulder ROM of RUE; be able to go back to work without limitations;  be able to swim, hang out with family;    Currently in Pain? Yes  Reports her R shoulder is achey now.    Pain Orientation Right   Pain Descriptors / Indicators Aching   Pain Type Chronic pain;Surgical pain   Pain Onset More than a month ago   Pain Frequency Intermittent         PROM - ER  x10 minutes to begin session as patient reported mild achiness to begin session, anterior shoulder tightness noted with decreased ER ~ 50% initially, improved to roughly 25% deficit by completion of session.   OMEGA x 9 at 5#, x6 @ 10#, x 10# x 12, x 15# for 8 repetitions -- cuing for scap retraction and elbows at lower abduction angle. (15# was too much at this time)   Prone shoulder extensions x 2# for 12 repetitions x 2 sets   Prone shoulder rows/retractions x 12 repetitions with 4# DB x 2 sets   Worked with patient on transferring patients up in bed, initially performed via tablecloth on plinth  and 10# DB, progressed to 20# KB, and finally 20+40# KBs with PT assistance (to resemble +2 transfer). Initially with 10# DB, patient noted to have excessive reliance on elbow flexion moment demand, educated to transfer weight through LEs instead to reduce demand on shoulder and transfer to larger LE musculature. Initially uncomfortable for her, once educated on using LEs, she reported reduced discomfort.   Shoulder Press x 5 with 2# DB (reported discomfort, but not excessively muscularly challenging). Wants to be able to perform 10# press like with other UE.                            PT Education - 04/09/16 1042    Education provided Yes   Education Details Techniques with lifting patients up in bed via use of LEs.     Person(s) Educated Patient   Methods Explanation   Comprehension Verbalized understanding             PT Long Term Goals - 03/11/16 0850      PT LONG TERM GOAL #1   Title Patient will be independent in home exercise program to improve strength/mobility for better functional independence with ADLs.    Baseline 03/11/16: reviewed and confirmed with patient   Time 4   Period Weeks   Status Achieved     PT LONG TERM GOAL #2   Title  Patient will decrease Quick DASH score by > 8 points demonstrating reduced self-reported upper extremity disability.   Baseline 43.18% percieved disability, 03/11/16: 31.81%   Time 4   Period Weeks   Status Achieved     PT LONG TERM GOAL #3   Title Patient will improve shoulder AROM to > 150 degrees of flexion, scaption, and abduction for improved ability to perform overhead activities.   Baseline 140 degrees flexion, 115 degrees abduction on 01/17/16, able to flex to 168 on 9/11; 03/11/16: active=passive 155 degrees flexion, 155 degrees abduction   Time 4   Period Weeks   Status Achieved     PT LONG TERM GOAL #4   Title Patient will report a worst pain of 3/10 on VAS in right shoulder to improve tolerance with ADLs and reduced symptoms with activities.    Baseline Worst pain reported 01/17/16 of 7/10; instances of sharp pain, mostly mild-moderate on 9/11; 03/11/16: 6/10 worst pain   Time 4   Period Weeks   Status Not Met     PT LONG TERM GOAL #5   Title Patient will improve RUE shoulder strength to >4/5 for improved strength with lifting and ADLs to return to PLOF.    Baseline 3+/5 grossly R shoulder on 01/17/16 -- not formally tested on 9/11; 03/11/16: 4-/5 R shoulder flexion/abduction, 4-/5 R shoulder IR, doesn't tolerate ER MMT;    Time 4   Period Weeks   Status On-going               Plan - 04/09/16 1043    Clinical Impression Statement Patient returns for additional 4 weeks of therapy per doctors orders. She reports her repetitive low  level tasks are not a problem at this time, it is heavier lifting, such as moving patients up in bed that lead to pain. She demonstrates excessive reliance on UEs to complete transfers/moving patients, and reported sensation of LEs working with cuing to shift weight towards the head of the bed. She continues to have periscapular weakness and decreased ER PROM likely both contributing  to her residual symptoms.    Rehab Potential Good   Clinical Impairments Affecting Rehab Potential positive indicator: young in age, motivated, also has help at home; Negative indicator: chronic pain, smoker; Patient's clinical presentation is stable as her pain is localized to right shoulder and does not vary much;    PT Frequency 2x / week   PT Duration 6 weeks   PT Treatment/Interventions ADLs/Self Care Home Management;Cryotherapy;Electrical Stimulation;Iontophoresis 55m/ml Dexamethasone;Moist Heat;Therapeutic exercise;Therapeutic activities;Functional mobility training;Ultrasound;Neuromuscular re-education;Patient/family education;Manual techniques;Taping;Energy conservation;Dry needling;Passive range of motion   PT Next Visit Plan Discharge   PT Home Exercise Plan [facility] ER stretch    Consulted and Agree with Plan of Care Patient      Patient will benefit from skilled therapeutic intervention in order to improve the following deficits and impairments:  Hypomobility, Decreased activity tolerance, Decreased strength, Pain, Impaired UE functional use, Decreased range of motion, Improper body mechanics, Postural dysfunction, Impaired flexibility  Visit Diagnosis: Stiffness of right shoulder, not elsewhere classified  Muscle weakness (generalized)  Chronic right shoulder pain     Problem List Patient Active Problem List   Diagnosis Date Noted  . Menorrhagia with regular cycle 01/23/2016  . Special screening for malignant neoplasms, colon   . Thrombocytosis ([place] 12/06/2015  . Leukocytosis 12/06/2015  .  Vitamin B12 deficiency anemia 12/01/2014  . Blood glucose elevated 12/01/2014  . Avitaminosis D 12/01/2014  . GU infection, trichomonal 12/01/2014  . Irregular bleeding 12/01/2014  . Anemia, iron deficiency 12/01/2014  . Essential (primary) hypertension 05/03/2009  . Depression, neurotic 02/03/2009   PKerman Passey PT, DPT    04/09/2016, 10:50 AM  [facility] [address] [place] [id number]: [phone]  Fax:  [phone] Name: Anna MAHRMRN: [id number] of Birth: 207-Jan-1967 Late addendum: Exercise/activity performed in previous tx session.

## 2016-04-10 ENCOUNTER — Encounter: Payer: Self-pay | Admitting: Physical Therapy

## 2016-04-11 ENCOUNTER — Encounter: Payer: PRIVATE HEALTH INSURANCE | Admitting: Physical Therapy

## 2016-04-17 ENCOUNTER — Ambulatory Visit: Payer: PRIVATE HEALTH INSURANCE | Attending: Orthopedic Surgery | Admitting: Physical Therapy

## 2016-04-17 ENCOUNTER — Encounter: Payer: Self-pay | Admitting: Physical Therapy

## 2016-04-17 DIAGNOSIS — M25611 Stiffness of right shoulder, not elsewhere classified: Secondary | ICD-10-CM | POA: Diagnosis not present

## 2016-04-17 DIAGNOSIS — M25511 Pain in right shoulder: Secondary | ICD-10-CM | POA: Insufficient documentation

## 2016-04-17 DIAGNOSIS — G8929 Other chronic pain: Secondary | ICD-10-CM | POA: Diagnosis present

## 2016-04-17 DIAGNOSIS — M6281 Muscle weakness (generalized): Secondary | ICD-10-CM | POA: Insufficient documentation

## 2016-04-17 NOTE — Therapy (Signed)
[place] [address], [place], [address] Phone: [phone]   Fax:  [phone]  Physical Therapy Treatment  Patient Details  Name: Anna Bates MRN: [id number] Date of Birth: 1965-07-12 Referring Provider: Dr. Onnie Graham  Encounter Date: 04/17/2016      PT End of Session - 04/17/16 1000    Visit Number 37   Number of Visits 42   Date for PT Re-Evaluation 05/21/16   Authorization Type worker's comp approved 12 visits   PT Start Time [phone]   PT Stop Time 0930   PT Time Calculation (min) 40 min   Activity Tolerance Patient limited by pain   Behavior During Therapy [facility] for tasks assessed/performed      Past Medical History:  Diagnosis Date  . Anemia     Past Surgical History:  Procedure Laterality Date  . BREAST SURGERY  2002   Breast reduction -both  . CESAREAN SECTION  1990  . CHOLECYSTECTOMY  1991  . COLONOSCOPY WITH PROPOFOL N/A 12/21/2015   Procedure: COLONOSCOPY WITH PROPOFOL;  Surgeon: Lucilla Lame, MD;  Location: [place];  Service: Endoscopy;  Laterality: N/A;  . HERNIA REPAIR  1980  . REDUCTION MAMMAPLASTY Bilateral 2000  . right shoulder surgery Right 11/07/15   shoulder scope;   . SHOULDER SURGERY Right 05/09/2009    There were no vitals filed for this visit.      Subjective Assessment - 04/17/16 0955    Subjective Patient states she noticed increasing pain/throbbing last Tuesday night after therapy. She reports it has been a constant throb/ache since that time, about as intense as it would get prior to her operation. She has not found anything that alleviates her symptoms. She called her MD office in hopes of a cortisone shot for relief, she was informed it would need to be approved first. She reports the pain was initially on superior aspect of shoulder, has now moved to anterior/posterior regions of deltoid and has been a constant ache/throb. She is not sure what may  have led to this, believes perhaps the overhead press performed in that session. She really noticed it over the weekend when she was going to pour tea and dropped the container on the floor due to pain.    Pertinent History Personal factors affecting rehab: every day smoker; chronic shoulder pain with multiple injuries;    How long can you sit comfortably? sleeping in a recliner;    How long can you stand comfortably? NA   How long can you walk comfortably? NA   Diagnostic tests no recent scans; goes back to see MD next Tuesday June 13th   Patient Stated Goals "To be able to have full shoulder ROM of RUE; be able to go back to work without limitations;  be able to swim, hang out with family;    Currently in Pain? Yes   Pain Score --  Significant and severe throbbing pain   Pain Location Shoulder   Pain Orientation Right   Pain Descriptors / Indicators Throbbing   Pain Type Chronic pain;Surgical pain   Pain Radiating Towards Anterior shoulder (deltoid) and posterior shoulder (posterior deltoid and teres minor region)    Pain Onset More than a month ago   Pain Frequency Constant      PT opted toward pain control this session, attempted PROM for ER in supine which was too intense for patient at this time. Opted for soft tissue mobilization instead, which was  initially quite hyper-algesic in anterior deltoid area, however this reduced with prolonged tx directed towards. At the conclusion of STM, patient asked for her shoulder to be taped as she found this beneficial in previous sessions with initial therapist. Provided 3 strips of Leuko-tape from anterior deltoid to posterior deltoid, patient asked for the tape to be applied as tightly as possible. Once provided, patient reported decreased discomfort as her shoulder felt "in better alignment".                            PT Education - 04/17/16 1000    Education provided Yes   Education Details No activities performed in  last session were contra-indicated, return for visit tomorrow.    Person(s) Educated Patient   Methods Explanation   Comprehension Verbalized understanding             PT Long Term Goals - 03/11/16 0850      PT LONG TERM GOAL #1   Title Patient will be independent in home exercise program to improve strength/mobility for better functional independence with ADLs.    Baseline 03/11/16: reviewed and confirmed with patient   Time 4   Period Weeks   Status Achieved     PT LONG TERM GOAL #2   Title  Patient will decrease Quick DASH score by > 8 points demonstrating reduced self-reported upper extremity disability.   Baseline 43.18% percieved disability, 03/11/16: 31.81%   Time 4   Period Weeks   Status Achieved     PT LONG TERM GOAL #3   Title Patient will improve shoulder AROM to > 150 degrees of flexion, scaption, and abduction for improved ability to perform overhead activities.   Baseline 140 degrees flexion, 115 degrees abduction on 01/17/16, able to flex to 168 on 9/11; 03/11/16: active=passive 155 degrees flexion, 155 degrees abduction   Time 4   Period Weeks   Status Achieved     PT LONG TERM GOAL #4   Title Patient will report a worst pain of 3/10 on VAS in right shoulder to improve tolerance with ADLs and reduced symptoms with activities.    Baseline Worst pain reported 01/17/16 of 7/10; instances of sharp pain, mostly mild-moderate on 9/11; 03/11/16: 6/10 worst pain   Time 4   Period Weeks   Status Not Met     PT LONG TERM GOAL #5   Title Patient will improve RUE shoulder strength to >4/5 for improved strength with lifting and ADLs to return to PLOF.    Baseline 3+/5 grossly R shoulder on 01/17/16 -- not formally tested on 9/11; 03/11/16: 4-/5 R shoulder flexion/abduction, 4-/5 R shoulder IR, doesn't tolerate ER MMT;    Time 4   Period Weeks   Status On-going               Plan - 04/17/16 1001    Clinical Impression Statement Patient indicates she has had nearly  constant throbbing pain since previous date of PT session, unclear of what has led to this. No redness noted in shoulder region, hyperalgesic to the touch in anterior deltoid. She finds some relief with soft tissue mobilization and asks for taping be performed for shoulder as she indicates it helps her feel "in better alignment". She has called MD office for cortisone shot, unclear when this can be provided. Unclear what has exacerbated her symptoms as in previous session activities were in line with what she had been doing at  work. Will continue to monitor and attempt to re-establish pain control and ROM.   Rehab Potential Good   Clinical Impairments Affecting Rehab Potential positive indicator: young in age, motivated, also has help at home; Negative indicator: chronic pain, smoker; Patient's clinical presentation is stable as her pain is localized to right shoulder and does not vary much;    PT Frequency 2x / week   PT Duration 6 weeks   PT Treatment/Interventions ADLs/Self Care Home Management;Cryotherapy;Electrical Stimulation;Iontophoresis 69m/ml Dexamethasone;Moist Heat;Therapeutic exercise;Therapeutic activities;Functional mobility training;Ultrasound;Neuromuscular re-education;Patient/family education;Manual techniques;Taping;Energy conservation;Dry needling;Passive range of motion   PT Next Visit Plan Pain control   PT Home Exercise Plan Doorway ER stretch    Consulted and Agree with Plan of Care Patient      Patient will benefit from skilled therapeutic intervention in order to improve the following deficits and impairments:  Hypomobility, Decreased activity tolerance, Decreased strength, Pain, Impaired UE functional use, Decreased range of motion, Improper body mechanics, Postural dysfunction, Impaired flexibility  Visit Diagnosis: Stiffness of right shoulder, not elsewhere classified  Muscle weakness (generalized)  Chronic right shoulder pain     Problem List Patient Active  Problem List   Diagnosis Date Noted  . Menorrhagia with regular cycle 01/23/2016  . Special screening for malignant neoplasms, colon   . Thrombocytosis ([place] 12/06/2015  . Leukocytosis 12/06/2015  . Vitamin B12 deficiency anemia 12/01/2014  . Blood glucose elevated 12/01/2014  . Avitaminosis D 12/01/2014  . GU infection, trichomonal 12/01/2014  . Irregular bleeding 12/01/2014  . Anemia, iron deficiency 12/01/2014  . Essential (primary) hypertension 05/03/2009  . Depression, neurotic 02/03/2009    PKerman Passey PT, DPT    04/17/2016, 10:04 AM  [facility] [address] [place] [address]Phone: [phone]  Fax:  [phone] Name: KNAREH MATZKEMRN: [id number] of Birth: 212-14-1967

## 2016-04-18 ENCOUNTER — Ambulatory Visit: Payer: PRIVATE HEALTH INSURANCE | Admitting: Physical Therapy

## 2016-04-18 DIAGNOSIS — G8929 Other chronic pain: Secondary | ICD-10-CM

## 2016-04-18 DIAGNOSIS — M6281 Muscle weakness (generalized): Secondary | ICD-10-CM

## 2016-04-18 DIAGNOSIS — M25511 Pain in right shoulder: Secondary | ICD-10-CM

## 2016-04-18 DIAGNOSIS — M25611 Stiffness of right shoulder, not elsewhere classified: Secondary | ICD-10-CM | POA: Diagnosis not present

## 2016-04-18 NOTE — Patient Instructions (Signed)
PROM into flexion x 4 bouts for 1-2 minutes per bout through relatively pain free ROM, unable to tolerate past ~ 120 degrees in supine, thus oscillated below this. One bout of PROM into abduction in supine up to 90 degrees x 1 minute.   Self ROM with wand in supine flexion instructed to let RUE remain relatively inactive, completed 2 bouts of 6 repetitions to eye level.   Supine PROM for ER x 3 bouts x 1-2' -- to roughly 30-45 degrees with no significant increase in symptoms.   Self seated ER with wand 1 bout x 15 repetitions

## 2016-04-18 NOTE — Therapy (Signed)
[facility] [address], [place], [address] Phone: [phone]   Fax:  [phone]  Physical Therapy Treatment  Patient Details  Name: Anna Bates MRN: [id number] Date of Birth: 08/26/1965 Referring Provider: Dr. Onnie Graham  Encounter Date: 04/18/2016      PT End of Session - 04/18/16 1721    Visit Number 38   Number of Visits 42   Date for PT Re-Evaluation 05/21/16   Authorization Type worker's comp approved 12 visits   PT Start Time 1645   PT Stop Time 1715   PT Time Calculation (min) 30 min   Activity Tolerance No increased pain   Behavior During Therapy [facility] for tasks assessed/performed      Past Medical History:  Diagnosis Date  . Anemia     Past Surgical History:  Procedure Laterality Date  . BREAST SURGERY  2002   Breast reduction -both  . CESAREAN SECTION  1990  . CHOLECYSTECTOMY  1991  . COLONOSCOPY WITH PROPOFOL N/A 12/21/2015   Procedure: COLONOSCOPY WITH PROPOFOL;  Surgeon: Lucilla Lame, MD;  Location: [place];  Service: Endoscopy;  Laterality: N/A;  . HERNIA REPAIR  1980  . REDUCTION MAMMAPLASTY Bilateral 2000  . right shoulder surgery Right 11/07/15   shoulder scope;   . SHOULDER SURGERY Right 05/09/2009    There were no vitals filed for this visit.      Subjective Assessment - 04/18/16 1711    Subjective Patient reports she is feeling somewhat better than yesterday, though she was not able to sleep last night.    Pertinent History Personal factors affecting rehab: every day smoker; chronic shoulder pain with multiple injuries;    How long can you sit comfortably? sleeping in a recliner;    How long can you stand comfortably? NA   How long can you walk comfortably? NA   Diagnostic tests no recent scans; goes back to see MD next Tuesday June 13th   Patient Stated Goals "To be able to have full shoulder ROM of RUE; be able to go back to work without limitations;   be able to swim, hang out with family;    Currently in Pain? Yes   Pain Score --  Patient does not report on 0-10, reports she could not sleep last night but is having decreased pain from yesterday   Pain Location Shoulder   Pain Orientation Right   Pain Descriptors / Indicators Aching;Throbbing   Pain Type Surgical pain;Chronic pain   Pain Onset More than a month ago   Pain Frequency Constant      PROM into flexion x 4 bouts for 1-2 minutes per bout through relatively pain free ROM, unable to tolerate past ~ 120 degrees in supine, thus oscillated below this. One bout of PROM into abduction in supine up to 90 degrees x 1 minute.   Self ROM with wand in supine flexion instructed to let RUE remain relatively inactive, completed 2 bouts of 6 repetitions to eye level.   Supine PROM for ER x 3 bouts x 1-2' -- to roughly 30-45 degrees with no significant increase in symptoms.   Self seated ER with wand 1 bout x 15 repetitions                             PT Education - 04/18/16 1720    Education provided Yes   Education Details Perform gentle ROM  for pain control over the weekend.    Person(s) Educated Patient   Methods Explanation   Comprehension Verbalized understanding             PT Long Term Goals - 03/11/16 0850      PT LONG TERM GOAL #1   Title Patient will be independent in home exercise program to improve strength/mobility for better functional independence with ADLs.    Baseline 03/11/16: reviewed and confirmed with patient   Time 4   Period Weeks   Status Achieved     PT LONG TERM GOAL #2   Title  Patient will decrease Quick DASH score by > 8 points demonstrating reduced self-reported upper extremity disability.   Baseline 43.18% percieved disability, 03/11/16: 31.81%   Time 4   Period Weeks   Status Achieved     PT LONG TERM GOAL #3   Title Patient will improve shoulder AROM to > 150 degrees of flexion, scaption, and abduction for improved  ability to perform overhead activities.   Baseline 140 degrees flexion, 115 degrees abduction on 01/17/16, able to flex to 168 on 9/11; 03/11/16: active=passive 155 degrees flexion, 155 degrees abduction   Time 4   Period Weeks   Status Achieved     PT LONG TERM GOAL #4   Title Patient will report a worst pain of 3/10 on VAS in right shoulder to improve tolerance with ADLs and reduced symptoms with activities.    Baseline Worst pain reported 01/17/16 of 7/10; instances of sharp pain, mostly mild-moderate on 9/11; 03/11/16: 6/10 worst pain   Time 4   Period Weeks   Status Not Met     PT LONG TERM GOAL #5   Title Patient will improve RUE shoulder strength to >4/5 for improved strength with lifting and ADLs to return to PLOF.    Baseline 3+/5 grossly R shoulder on 01/17/16 -- not formally tested on 9/11; 03/11/16: 4-/5 R shoulder flexion/abduction, 4-/5 R shoulder IR, doesn't tolerate ER MMT;    Time 4   Period Weeks   Status On-going               Plan - 04/18/16 1721    Clinical Impression Statement Patient reports some improvement in symptoms after treatment yesterday, she is able to tolerate range of motion exercises this date, deferred any more aggressive tx this date due to high pain levels recently. She reports feeling "alright" after session, educated to continue with ROM exercises over the weekend.    Rehab Potential Good   Clinical Impairments Affecting Rehab Potential positive indicator: young in age, motivated, also has help at home; Negative indicator: chronic pain, smoker; Patient's clinical presentation is stable as her pain is localized to right shoulder and does not vary much;    PT Frequency 2x / week   PT Duration 6 weeks   PT Treatment/Interventions ADLs/Self Care Home Management;Cryotherapy;Electrical Stimulation;Iontophoresis 3m/ml Dexamethasone;Moist Heat;Therapeutic exercise;Therapeutic activities;Functional mobility training;Ultrasound;Neuromuscular  re-education;Patient/family education;Manual techniques;Taping;Energy conservation;Dry needling;Passive range of motion   PT Next Visit Plan Pain control   PT Home Exercise Plan Range of motion exercises.    Consulted and Agree with Plan of Care Patient      Patient will benefit from skilled therapeutic intervention in order to improve the following deficits and impairments:  Hypomobility, Decreased activity tolerance, Decreased strength, Pain, Impaired UE functional use, Decreased range of motion, Improper body mechanics, Postural dysfunction, Impaired flexibility  Visit Diagnosis: Stiffness of right shoulder, not elsewhere classified  Muscle weakness (  generalized)  Chronic right shoulder pain     Problem List Patient Active Problem List   Diagnosis Date Noted  . Menorrhagia with regular cycle 01/23/2016  . Special screening for malignant neoplasms, colon   . Thrombocytosis ([place]) 12/06/2015  . Leukocytosis 12/06/2015  . Vitamin B12 deficiency anemia 12/01/2014  . Blood glucose elevated 12/01/2014  . Avitaminosis D 12/01/2014  . GU infection, trichomonal 12/01/2014  . Irregular bleeding 12/01/2014  . Anemia, iron deficiency 12/01/2014  . Essential (primary) hypertension 05/03/2009  . Depression, neurotic 02/03/2009    Kerman Passey, PT, DPT    04/18/2016, 5:27 PM  [facility] [address] [place], [address] Phone: [phone]   Fax:  [phone]  Name: Anna Bates MRN: [id number] Date of Birth: 04/15/1966

## 2016-04-22 ENCOUNTER — Ambulatory Visit: Payer: PRIVATE HEALTH INSURANCE | Admitting: Physical Therapy

## 2016-04-22 DIAGNOSIS — G8929 Other chronic pain: Secondary | ICD-10-CM

## 2016-04-22 DIAGNOSIS — M25511 Pain in right shoulder: Secondary | ICD-10-CM

## 2016-04-22 DIAGNOSIS — M25611 Stiffness of right shoulder, not elsewhere classified: Secondary | ICD-10-CM | POA: Diagnosis not present

## 2016-04-22 DIAGNOSIS — M6281 Muscle weakness (generalized): Secondary | ICD-10-CM

## 2016-04-22 NOTE — Therapy (Signed)
[facility] [address] [place], [address] Phone: [phone]   Fax:  [phone]  Physical Therapy Treatment  Patient Details  Name: Anna Bates MRN: [id number] Date of Birth: May 17, 1966 Referring Provider: Dr. Onnie Graham  Encounter Date: 04/22/2016      PT End of Session - 04/22/16 0843    Visit Number 39   Number of Visits 42   Date for PT Re-Evaluation 05/21/16   Authorization Type worker's comp approved 12 visits   PT Start Time 0807   PT Stop Time 0842   PT Time Calculation (min) 35 min   Activity Tolerance No increased pain   Behavior During Therapy [facility] for tasks assessed/performed      Past Medical History:  Diagnosis Date  . Anemia     Past Surgical History:  Procedure Laterality Date  . BREAST SURGERY  2002   Breast reduction -both  . CESAREAN SECTION  1990  . CHOLECYSTECTOMY  1991  . COLONOSCOPY WITH PROPOFOL N/A 12/21/2015   Procedure: COLONOSCOPY WITH PROPOFOL;  Surgeon: Lucilla Lame, MD;  Location: [facility] [place];  Service: Endoscopy;  Laterality: N/A;  . HERNIA REPAIR  1980  . REDUCTION MAMMAPLASTY Bilateral 2000  . right shoulder surgery Right 11/07/15   shoulder scope;   . SHOULDER SURGERY Right 05/09/2009    There were no vitals filed for this visit.      Subjective Assessment - 04/22/16 0810    Subjective Patient reports she was able to work this weekend with minimal complaints as her shoulder was taped. She was able to get some increased sleep over the weekend.    Pertinent History Personal factors affecting rehab: every day smoker; chronic shoulder pain with multiple injuries;    How long can you sit comfortably? sleeping in a recliner;    How long can you stand comfortably? NA   How long can you walk comfortably? NA   Diagnostic tests no recent scans; goes back to see MD next Tuesday June 13th   Patient Stated Goals "To be able to have full shoulder ROM of  RUE; be able to go back to work without limitations;  be able to swim, hang out with family;    Currently in Pain? Yes   Pain Score --  Reports she still has soreness, but not like it was early last week.    Pain Location Shoulder   Pain Orientation Right   Pain Type Chronic pain;Surgical pain   Pain Onset More than a month ago   Pain Frequency Constant       PROM x ~ 10 minutes with intermittent breaks with tolerated ROM into ER. Patient able to tolerate significant increase in ROM from previous session with no reported increase in symptoms.   AAROM in supine with dowel -- x 8, x6 to ~ 90-100 degrees elevation, no complaints of increased pain.   Isometric ER in sitting on RUE x 8 (2-3" holds) for 3 sets  LT Rows with yellow t-band x 12, x12 (cuing for scapular retraction) -- reports doing "ok" afterwards.                           PT Education - 04/22/16 [phone]    Education provided Yes   Education Details Will continue to perform gentle loading and increase in ROM as tolerated.    Person(s) Educated Patient   Methods Explanation   Comprehension Verbalized  understanding             PT Long Term Goals - 03/11/16 0850      PT LONG TERM GOAL #1   Title Patient will be independent in home exercise program to improve strength/mobility for better functional independence with ADLs.    Baseline 03/11/16: reviewed and confirmed with patient   Time 4   Period Weeks   Status Achieved     PT LONG TERM GOAL #2   Title  Patient will decrease Quick DASH score by > 8 points demonstrating reduced self-reported upper extremity disability.   Baseline 43.18% percieved disability, 03/11/16: 31.81%   Time 4   Period Weeks   Status Achieved     PT LONG TERM GOAL #3   Title Patient will improve shoulder AROM to > 150 degrees of flexion, scaption, and abduction for improved ability to perform overhead activities.   Baseline 140 degrees flexion, 115 degrees abduction on  01/17/16, able to flex to 168 on 9/11; 03/11/16: active=passive 155 degrees flexion, 155 degrees abduction   Time 4   Period Weeks   Status Achieved     PT LONG TERM GOAL #4   Title Patient will report a worst pain of 3/10 on VAS in right shoulder to improve tolerance with ADLs and reduced symptoms with activities.    Baseline Worst pain reported 01/17/16 of 7/10; instances of sharp pain, mostly mild-moderate on 9/11; 03/11/16: 6/10 worst pain   Time 4   Period Weeks   Status Not Met     PT LONG TERM GOAL #5   Title Patient will improve RUE shoulder strength to >4/5 for improved strength with lifting and ADLs to return to PLOF.    Baseline 3+/5 grossly R shoulder on 01/17/16 -- not formally tested on 9/11; 03/11/16: 4-/5 R shoulder flexion/abduction, 4-/5 R shoulder IR, doesn't tolerate ER MMT;    Time 4   Period Weeks   Status On-going               Plan - 04/22/16 0843    Clinical Impression Statement Patient was able to perform work related activities with tape on R shoulder over the weekend. Notable increase in tolerated ER PROM this session, able to perform isometrics and light LT activities without significant increase in symptoms. Patient is tolerating progressively increased loads from exacerbation, will continue to progressively increase load she can tolerate over the next few sessions.    Rehab Potential Good   Clinical Impairments Affecting Rehab Potential positive indicator: young in age, motivated, also has help at home; Negative indicator: chronic pain, smoker; Patient's clinical presentation is stable as her pain is localized to right shoulder and does not vary much;    PT Frequency 2x / week   PT Duration 6 weeks   PT Treatment/Interventions ADLs/Self Care Home Management;Cryotherapy;Electrical Stimulation;Iontophoresis 47m/ml Dexamethasone;Moist Heat;Therapeutic exercise;Therapeutic activities;Functional mobility training;Ultrasound;Neuromuscular re-education;Patient/family  education;Manual techniques;Taping;Energy conservation;Dry needling;Passive range of motion   PT Next Visit Plan Gentle increase in ROM and load tolerance. May consider taping.    PT Home Exercise Plan Range of motion exercises.    Consulted and Agree with Plan of Care Patient      Patient will benefit from skilled therapeutic intervention in order to improve the following deficits and impairments:  Hypomobility, Decreased activity tolerance, Decreased strength, Pain, Impaired UE functional use, Decreased range of motion, Improper body mechanics, Postural dysfunction, Impaired flexibility  Visit Diagnosis: Stiffness of right shoulder, not elsewhere classified  Chronic right  shoulder pain  Muscle weakness (generalized)     Problem List Patient Active Problem List   Diagnosis Date Noted  . Menorrhagia with regular cycle 01/23/2016  . Special screening for malignant neoplasms, colon   . Thrombocytosis ([place]) 12/06/2015  . Leukocytosis 12/06/2015  . Vitamin B12 deficiency anemia 12/01/2014  . Blood glucose elevated 12/01/2014  . Avitaminosis D 12/01/2014  . GU infection, trichomonal 12/01/2014  . Irregular bleeding 12/01/2014  . Anemia, iron deficiency 12/01/2014  . Essential (primary) hypertension 05/03/2009  . Depression, neurotic 02/03/2009   Kerman Passey, PT, DPT    04/22/2016, 8:56 AM  [facility] [address] [place], [address] Phone: [phone]   Fax:  [phone]  Name: Anna Bates MRN: [id number] Date of Birth: Jul 30, 1965

## 2016-04-22 NOTE — Patient Instructions (Addendum)
PROM  AAROM in supine with dowel -- x 8, x6 to ~ 90-100 degrees elevation, no complaints of increased pain.   Isometric ER in sitting on RUE x 8 (2-3" holds) for 3 sets  LT Rows with yellow t-band x 12, x12 (cuing for scapular retraction) -- reports doing "ok" afterwards.

## 2016-04-24 ENCOUNTER — Encounter: Payer: Self-pay | Admitting: Physical Therapy

## 2016-04-29 ENCOUNTER — Encounter: Payer: Self-pay | Admitting: Physical Therapy

## 2016-04-30 ENCOUNTER — Ambulatory Visit: Payer: PRIVATE HEALTH INSURANCE | Admitting: Physical Therapy

## 2016-04-30 DIAGNOSIS — M25611 Stiffness of right shoulder, not elsewhere classified: Secondary | ICD-10-CM | POA: Diagnosis not present

## 2016-04-30 DIAGNOSIS — M6281 Muscle weakness (generalized): Secondary | ICD-10-CM

## 2016-04-30 DIAGNOSIS — G8929 Other chronic pain: Secondary | ICD-10-CM

## 2016-04-30 DIAGNOSIS — M25511 Pain in right shoulder: Secondary | ICD-10-CM

## 2016-04-30 NOTE — Therapy (Signed)
[facility] [address] [place], [address] Phone: [phone]   Fax:  [phone]  Physical Therapy Treatment  Patient Details  Name: Anna Bates MRN: [id number] Date of Birth: 09/19/1965 Referring Provider: Dr. Onnie Graham  Encounter Date: 04/30/2016      PT End of Session - 04/30/16 1701    Visit Number 40   Number of Visits 42   Date for PT Re-Evaluation 05/21/16   Authorization Type worker's comp approved 12 visits   PT Start Time 1645   PT Stop Time 1715   PT Time Calculation (min) 30 min   Activity Tolerance No increased pain   Behavior During Therapy [facility] for tasks assessed/performed      Past Medical History:  Diagnosis Date  . Anemia     Past Surgical History:  Procedure Laterality Date  . BREAST SURGERY  2002   Breast reduction -both  . CESAREAN SECTION  1990  . CHOLECYSTECTOMY  1991  . COLONOSCOPY WITH PROPOFOL N/A 12/21/2015   Procedure: COLONOSCOPY WITH PROPOFOL;  Surgeon: Lucilla Lame, MD;  Location: [facility];  Service: Endoscopy;  Laterality: N/A;  . HERNIA REPAIR  1980  . REDUCTION MAMMAPLASTY Bilateral 2000  . right shoulder surgery Right 11/07/15   shoulder scope;   . SHOULDER SURGERY Right 05/09/2009    There were no vitals filed for this visit.      Subjective Assessment - 04/30/16 1649    Subjective Patient reports she took the FCE last week, she was doing quite well up until having to pulling/lifting activities. She did ok with gripping/pinching. Patient reports she sees her orthopedic tomorrow. She was in significant pain after, though today it is "not bad".     Pertinent History Personal factors affecting rehab: every day smoker; chronic shoulder pain with multiple injuries;    How long can you sit comfortably? sleeping in a recliner;    How long can you stand comfortably? NA   How long can you walk comfortably? NA   Diagnostic tests no recent scans;  goes back to see MD next Tuesday June 13th   Patient Stated Goals "To be able to have full shoulder ROM of RUE; be able to go back to work without limitations;  be able to swim, hang out with family;    Currently in Pain? Yes   Pain Score --  "Not bad"   Pain Location Shoulder   Pain Orientation Right   Pain Descriptors / Indicators Aching   Pain Type Chronic pain;Surgical pain   Pain Onset More than a month ago   Pain Frequency Constant   Aggravating Factors  Lifting and pulling.       Assessed supine flexion - appears close to full ROM, PROM into ER was improved from previous sessions, appropriate end feel, performed stretching x 3 minutes with no increase in pain.   Chest press with dowel with scapular protraction x 10 for 2 sets for full ROM, no increase in pain noted   Bent over rows with dowel x 10 for 2 sets, appropriate ROM and technique, no pain reported.   Supine ER with dowel x 15 per side with 3-5" holds with no increase in pain, 2 sets completed.   Chest press with RUE in supine x 2# for 12 repetitions (no pain), able to complete with 3#, not 4#, but felt too aggressive for shoulder, thus instructed to complete with 2# and progress every 2 weeks.  PT Education - 04/30/16 1701    Education provided Yes   Education Details Will modify tx plan based on FCE results provided tomorrow.    Person(s) Educated Patient   Methods Explanation;Handout;Demonstration   Comprehension Verbalized understanding;Returned demonstration             PT Long Term Goals - 03/11/16 0850      PT LONG TERM GOAL #1   Title Patient will be independent in home exercise program to improve strength/mobility for better functional independence with ADLs.    Baseline 03/11/16: reviewed and confirmed with patient   Time 4   Period Weeks   Status Achieved     PT LONG TERM GOAL #2   Title  Patient will decrease Quick DASH score by > 8 points  demonstrating reduced self-reported upper extremity disability.   Baseline 43.18% percieved disability, 03/11/16: 31.81%   Time 4   Period Weeks   Status Achieved     PT LONG TERM GOAL #3   Title Patient will improve shoulder AROM to > 150 degrees of flexion, scaption, and abduction for improved ability to perform overhead activities.   Baseline 140 degrees flexion, 115 degrees abduction on 01/17/16, able to flex to 168 on 9/11; 03/11/16: active=passive 155 degrees flexion, 155 degrees abduction   Time 4   Period Weeks   Status Achieved     PT LONG TERM GOAL #4   Title Patient will report a worst pain of 3/10 on VAS in right shoulder to improve tolerance with ADLs and reduced symptoms with activities.    Baseline Worst pain reported 01/17/16 of 7/10; instances of sharp pain, mostly mild-moderate on 9/11; 03/11/16: 6/10 worst pain   Time 4   Period Weeks   Status Not Met     PT LONG TERM GOAL #5   Title Patient will improve RUE shoulder strength to >4/5 for improved strength with lifting and ADLs to return to PLOF.    Baseline 3+/5 grossly R shoulder on 01/17/16 -- not formally tested on 9/11; 03/11/16: 4-/5 R shoulder flexion/abduction, 4-/5 R shoulder IR, doesn't tolerate ER MMT;    Time 4   Period Weeks   Status On-going               Plan - 04/30/16 1701    Clinical Impression Statement Patient has not responded well with any strengthening activity to date in therapy, however ROM exercises seem to consistently help reduce her symptoms. She was provided with ROM exercises into ER, flexion, and scapular protraction and instructed to perform x2 per day with gradual strengthening program provided for discharge. She will see orthopedist tomorrow to find results of FCE. WIll modify treatment plan going forward based on the results of testing.    Rehab Potential Good   Clinical Impairments Affecting Rehab Potential positive indicator: young in age, motivated, also has help at home; Negative  indicator: chronic pain, smoker; Patient's clinical presentation is stable as her pain is localized to right shoulder and does not vary much;    PT Frequency 2x / week   PT Duration 6 weeks   PT Treatment/Interventions ADLs/Self Care Home Management;Cryotherapy;Electrical Stimulation;Iontophoresis 41m/ml Dexamethasone;Moist Heat;Therapeutic exercise;Therapeutic activities;Functional mobility training;Ultrasound;Neuromuscular re-education;Patient/family education;Manual techniques;Taping;Energy conservation;Dry needling;Passive range of motion   PT Next Visit Plan Gentle increase in ROM and load tolerance. May consider taping.    PT Home Exercise Plan Range of motion exercises (flexion in supine, dowel ER stretching, chest press w/ scap protraction). x2 per day. Strengthening exercises  x 2 per week. Chest press with 2#, dowel bent over row, sidelying ER.    Consulted and Agree with Plan of Care Patient      Patient will benefit from skilled therapeutic intervention in order to improve the following deficits and impairments:  Hypomobility, Decreased activity tolerance, Decreased strength, Pain, Impaired UE functional use, Decreased range of motion, Improper body mechanics, Postural dysfunction, Impaired flexibility  Visit Diagnosis: Stiffness of right shoulder, not elsewhere classified  Chronic right shoulder pain  Muscle weakness (generalized)     Problem List Patient Active Problem List   Diagnosis Date Noted  . Menorrhagia with regular cycle 01/23/2016  . Special screening for malignant neoplasms, colon   . Thrombocytosis ([place]) 12/06/2015  . Leukocytosis 12/06/2015  . Vitamin B12 deficiency anemia 12/01/2014  . Blood glucose elevated 12/01/2014  . Avitaminosis D 12/01/2014  . GU infection, trichomonal 12/01/2014  . Irregular bleeding 12/01/2014  . Anemia, iron deficiency 12/01/2014  . Essential (primary) hypertension 05/03/2009  . Depression, neurotic 02/03/2009   Kerman Passey, PT, DPT    04/30/2016, 5:29 PM  [facility] [address] [place], [address] Phone: [phone]   Fax:  [phone]  Name: Anna Bates MRN: [id number] Date of Birth: February 24, 1966

## 2016-04-30 NOTE — Patient Instructions (Signed)
Assessed supine flexion - appears close to full ROM, PROM into ER was improved from previous sessions, appropriate end feel, performed stretching x 3 minutes with no increase in pain.   Chest press with dowel with scapular protraction x 10 for 2 sets for full ROM, no increase in pain noted   Bent over rows with dowel x 10 for 2 sets, appropriate ROM and technique, no pain reported.   Supine ER with dowel x 15 per side with 3-5" holds with no increase in pain, 2 sets completed.   Chest press with RUE in supine x 2# for 12 repetitions (no pain), able to complete with 3#, not 4#, but felt too aggressive for shoulder, thus instructed to complete with 2# and progress every 2 weeks.

## 2016-05-01 ENCOUNTER — Encounter: Payer: Self-pay | Admitting: Physical Therapy

## 2016-05-01 ENCOUNTER — Ambulatory Visit: Payer: PRIVATE HEALTH INSURANCE | Admitting: Physical Therapy

## 2016-05-06 ENCOUNTER — Encounter: Payer: Self-pay | Admitting: Physical Therapy

## 2016-05-08 ENCOUNTER — Encounter: Payer: Self-pay | Admitting: Physical Therapy

## 2016-05-20 ENCOUNTER — Inpatient Hospital Stay: Payer: 59 | Attending: Hematology and Oncology

## 2016-05-22 ENCOUNTER — Encounter: Payer: Self-pay | Admitting: Hematology and Oncology

## 2016-07-08 ENCOUNTER — Inpatient Hospital Stay: Payer: 59

## 2016-07-09 ENCOUNTER — Inpatient Hospital Stay: Payer: 59 | Admitting: Hematology and Oncology

## 2016-08-01 DIAGNOSIS — H524 Presbyopia: Secondary | ICD-10-CM | POA: Diagnosis not present

## 2016-08-01 DIAGNOSIS — H5213 Myopia, bilateral: Secondary | ICD-10-CM | POA: Diagnosis not present

## 2016-08-01 DIAGNOSIS — H52223 Regular astigmatism, bilateral: Secondary | ICD-10-CM | POA: Diagnosis not present

## 2016-08-06 ENCOUNTER — Ambulatory Visit (INDEPENDENT_AMBULATORY_CARE_PROVIDER_SITE_OTHER): Payer: 59 | Admitting: Physician Assistant

## 2016-08-06 ENCOUNTER — Encounter: Payer: Self-pay | Admitting: Physician Assistant

## 2016-08-06 VITALS — BP 136/80 | HR 84 | Temp 98.8°F | Resp 16 | Wt 223.0 lb

## 2016-08-06 DIAGNOSIS — J0110 Acute frontal sinusitis, unspecified: Secondary | ICD-10-CM

## 2016-08-06 MED ORDER — HYDROCODONE-HOMATROPINE 5-1.5 MG/5ML PO SYRP: 5.0000 mL | ORAL_SOLUTION | Freq: Every evening | ORAL | 0 refills | Status: DC | PRN

## 2016-08-06 MED ORDER — AMOXICILLIN-POT CLAVULANATE 875-125 MG PO TABS: 1.0000 | ORAL_TABLET | Freq: Two times a day (BID) | ORAL | 0 refills | Status: AC

## 2016-08-06 NOTE — Progress Notes (Signed)
[place]  Chief Complaint  Patient presents with  . URI    Started about two weeks ago.   . Sinusitis    Subjective:    Patient ID: Anna Bates, female    DOB: [birth year], 51 y.o.   MRN: [id number]  Upper Respiratory Infection: Anna Bates is a 51 y.o. female with a past medical history significant for tobacco abuse complaining of symptoms of a URI. Symptoms include congestion, cough and plugged sensation in both ears. Onset of symptoms was 2 weeks ago, gradually worsening since that time. She also c/o congestion, cough described as productive, nasal congestion and no  fever for the past 2 weeks .  She is drinking plenty of fluids. Evaluation to date: none. Treatment to date: cough suppressants and decongestants. The treatment has provided no relief.   Review of Systems  Constitutional: Positive for fatigue. Negative for activity change, appetite change, chills, diaphoresis, fever and unexpected weight change.  HENT: Positive for congestion, postnasal drip, rhinorrhea, sinus pain, sinus pressure and voice change. Negative for ear discharge, ear pain, nosebleeds, sneezing, sore throat and trouble swallowing.   Eyes: Negative.   Respiratory: Positive for cough, chest tightness, shortness of breath and wheezing. Negative for apnea, choking and stridor.   Gastrointestinal: Negative.   Allergic/Immunologic: Positive for environmental allergies.  Neurological: Negative for dizziness, light-headedness and headaches.       Objective:   BP 136/80 (BP Location: Left Arm, Patient Position: Sitting, Cuff Size: Large)   Pulse 84   Temp 98.8 F (37.1 C) (Oral)   Resp 16   Wt 223 lb (101.2 kg)   LMP 08/06/2016   SpO2 98%   BMI 33.99 kg/m   Patient Active Problem List   Diagnosis Date Noted  . Menorrhagia with regular cycle 01/23/2016  . Special screening for malignant neoplasms, colon   . Thrombocytosis ([place]) 12/06/2015  .  Leukocytosis 12/06/2015  . Vitamin B12 deficiency anemia 12/01/2014  . Blood glucose elevated 12/01/2014  . Avitaminosis D 12/01/2014  . GU infection, trichomonal 12/01/2014  . Irregular bleeding 12/01/2014  . Anemia, iron deficiency 12/01/2014  . Essential (primary) hypertension 05/03/2009  . Depression, neurotic 02/03/2009    Outpatient Encounter Prescriptions as of 08/06/2016  Medication Sig  . acetaminophen (TYLENOL) 500 MG tablet Take 500 mg by mouth every 6 (six) hours as needed.  . cyanocobalamin 2000 MCG tablet Take 2,000 mcg by mouth daily.  . ferrous sulfate 325 (65 FE) MG tablet Take 325 mg by mouth daily with breakfast.  . Vitamin D, Ergocalciferol, (DRISDOL) 50000 units CAPS capsule Take 1 capsule (50,000 Units total) by mouth every 7 (seven) days.  . [DISCONTINUED] diazepam (VALIUM) 5 MG tablet Take 5 mg by mouth every 6 (six) hours as needed for anxiety (1/2- 1 tab).  Marland Kitchen amoxicillin-clavulanate (AUGMENTIN) 875-125 MG tablet Take 1 tablet by mouth 2 (two) times daily.  Marland Kitchen HYDROcodone-homatropine (HYCODAN) 5-1.5 MG/5ML syrup Take 5 mLs by mouth at bedtime as needed for cough.   No facility-administered encounter medications on file as of 08/06/2016.     Allergies  Allergen Reactions  . Scallops [Shellfish Allergy] Nausea And Vomiting       Physical Exam  Constitutional: She is oriented to person, place, and time. She appears well-developed and well-nourished. No distress.  HENT:  Right Ear: External ear normal.  Left Ear: External ear normal.  Nose: Right sinus exhibits maxillary sinus tenderness and frontal sinus tenderness. Left sinus exhibits maxillary sinus  tenderness and frontal sinus tenderness.  Mouth/Throat: Posterior oropharyngeal erythema present. No oropharyngeal exudate or posterior oropharyngeal edema.  Tms opaque bilaterally   Eyes: Conjunctivae are normal. Right eye exhibits no discharge. Left eye exhibits no discharge.  Neck: Neck supple.   Cardiovascular: Normal rate and regular rhythm.   Pulmonary/Chest: Effort normal and breath sounds normal.  Lymphadenopathy:    She has no cervical adenopathy.  Neurological: She is alert and oriented to person, place, and time.  Skin: Skin is warm and dry. She is not diaphoretic.  Psychiatric: She has a normal mood and affect. Her behavior is normal.       Assessment & Plan:   1. Acute non-recurrent frontal sinusitis  Treat as below. Counseled on sedation risks.   - amoxicillin-clavulanate (AUGMENTIN) 875-125 MG tablet; Take 1 tablet by mouth 2 (two) times daily.  Dispense: 20 tablet; Refill: 0 - HYDROcodone-homatropine (HYCODAN) 5-1.5 MG/5ML syrup; Take 5 mLs by mouth at bedtime as needed for cough.  Dispense: 120 mL; Refill: 0   Recommend rest, fluids, frequent hand washing. Work note provided  Patient Instructions  Sinusitis, Adult Sinusitis is soreness and inflammation of your sinuses. Sinuses are hollow spaces in the bones around your face. They are located:  Around your eyes.  In the middle of your forehead.  Behind your nose.  In your cheekbones. Your sinuses and nasal passages are lined with a stringy fluid (mucus). Mucus normally drains out of your sinuses. When your nasal tissues get inflamed or swollen, the mucus can get trapped or blocked so air cannot flow through your sinuses. This lets bacteria, viruses, and funguses grow, and that leads to infection. Follow these instructions at home: Medicines   Take, use, or apply over-the-counter and prescription medicines only as told by your doctor. These may include nasal sprays.  If you were prescribed an antibiotic medicine, take it as told by your doctor. Do not stop taking the antibiotic even if you start to feel better. Hydrate and Humidify   Drink enough water to keep your pee (urine) clear or pale yellow.  Use a cool mist humidifier to keep the humidity level in your home above 50%.  Breathe in steam for  10-15 minutes, 3-4 times a day or as told by your doctor. You can do this in the bathroom while a hot shower is running.  Try not to spend time in cool or dry air. Rest   Rest as much as possible.  Sleep with your head raised (elevated).  Make sure to get enough sleep each night. General instructions   Put a warm, moist washcloth on your face 3-4 times a day or as told by your doctor. This will help with discomfort.  Wash your hands often with soap and water. If there is no soap and water, use hand sanitizer.  Do not smoke. Avoid being around people who are smoking (secondhand smoke).  Keep all follow-up visits as told by your doctor. This is important. Contact a doctor if:  You have a fever.  Your symptoms get worse.  Your symptoms do not get better within 10 days. Get help right away if:  You have a very bad headache.  You cannot stop throwing up (vomiting).  You have pain or swelling around your face or eyes.  You have trouble seeing.  You feel confused.  Your neck is stiff.  You have trouble breathing. This information is not intended to replace advice given to you by your health care provider.  Make sure you discuss any questions you have with your health care provider. Document Released: 11/13/2007 Document Revised: 01/21/2016 Document Reviewed: 03/22/2015 Elsevier Interactive Patient Education  2017 [employer].     The entirety of the information documented in the History of Present Illness, Review of Systems and Physical Exam were personally obtained by me. Portions of this information were initially documented by Ashley Royalty, CMA and reviewed by me for thoroughness and accuracy.

## 2016-08-06 NOTE — Patient Instructions (Signed)

## 2017-04-21 ENCOUNTER — Ambulatory Visit (INDEPENDENT_AMBULATORY_CARE_PROVIDER_SITE_OTHER): Payer: BLUE CROSS/BLUE SHIELD | Admitting: Physician Assistant

## 2017-04-21 ENCOUNTER — Encounter: Payer: Self-pay | Admitting: Physician Assistant

## 2017-04-21 VITALS — BP 146/72 | HR 92 | Temp 98.7°F | Wt 218.0 lb

## 2017-04-21 DIAGNOSIS — J40 Bronchitis, not specified as acute or chronic: Secondary | ICD-10-CM | POA: Diagnosis not present

## 2017-04-21 DIAGNOSIS — N3090 Cystitis, unspecified without hematuria: Secondary | ICD-10-CM

## 2017-04-21 MED ORDER — DOXYCYCLINE HYCLATE 100 MG PO TABS: 100.0000 mg | ORAL_TABLET | Freq: Two times a day (BID) | ORAL | 0 refills | Status: AC

## 2017-04-21 MED ORDER — ALBUTEROL SULFATE HFA 108 (90 BASE) MCG/ACT IN AERS: 2.0000 | INHALATION_SPRAY | Freq: Four times a day (QID) | RESPIRATORY_TRACT | 2 refills | Status: DC | PRN

## 2017-04-21 NOTE — Patient Instructions (Signed)

## 2017-04-21 NOTE — Progress Notes (Signed)
Patient: Anna Bates Female    DOB: [birth year]   51 y.o.   MRN: [id number] Visit Date: 04/21/2017  Today's Provider: Trinna Post, PA-C   Chief Complaint  Patient presents with  . URI    Started about two weeks ago.  Marland Kitchen Urinary Tract Infection    Started last week.    Subjective:    Anna Bates is a 51 y/o woman who smokes 1/2 pack per day presenting with below symptoms. Her URI symptoms have been present for two weeks. Her UTI symptoms have been present within the past week.   URI   This is a new problem. The current episode started in the past 7 days. The problem has been gradually worsening. The maximum temperature recorded prior to her arrival was 101 - 101.9 F. Associated symptoms include abdominal pain, congestion, coughing, dysuria, headaches, rhinorrhea, sinus pain, sneezing and a sore throat. Pertinent negatives include no diarrhea, ear pain, nausea, plugged ear sensation, vomiting or wheezing. She has tried decongestant for the symptoms.  Urinary Tract Infection   This is a new problem. The current episode started in the past 7 days. The problem has been gradually worsening. The maximum temperature recorded prior to her arrival was 101 - 101.9 F. She is not sexually active. There is no history of pyelonephritis. Associated symptoms include frequency and urgency. Pertinent negatives include no chills, flank pain, hematuria, nausea or vomiting. She has tried nothing for the symptoms.       Allergies  Allergen Reactions  . Scallops [Shellfish Allergy] Nausea And Vomiting     Current Outpatient Medications:  .  acetaminophen (TYLENOL) 500 MG tablet, Take 500 mg by mouth every 6 (six) hours as needed., Disp: , Rfl:  .  ferrous sulfate 325 (65 FE) MG tablet, Take 325 mg by mouth daily with breakfast., Disp: , Rfl:  .  Vitamin D, Ergocalciferol, (DRISDOL) 50000 units CAPS capsule, Take 1 capsule (50,000 Units total) by mouth every 7 (seven) days., Disp:  12 capsule, Rfl: 1 .  cyanocobalamin 2000 MCG tablet, Take 2,000 mcg by mouth daily., Disp: , Rfl:  .  HYDROcodone-homatropine (HYCODAN) 5-1.5 MG/5ML syrup, Take 5 mLs by mouth at bedtime as needed for cough. (Patient not taking: Reported on 04/21/2017), Disp: 120 mL, Rfl: 0  Review of Systems  Constitutional: Positive for fatigue and fever (Yesterday had a temp of 101.3). Negative for activity change, appetite change, chills, diaphoresis and unexpected weight change.  HENT: Positive for congestion, postnasal drip, rhinorrhea, sinus pressure, sinus pain, sneezing, sore throat and voice change. Negative for ear discharge, ear pain, nosebleeds, tinnitus and trouble swallowing.   Eyes: Negative.   Respiratory: Positive for cough. Negative for apnea, choking, chest tightness, shortness of breath, wheezing and stridor.   Gastrointestinal: Positive for abdominal pain. Negative for abdominal distention, anal bleeding, blood in stool, constipation, diarrhea, nausea, rectal pain and vomiting.  Genitourinary: Positive for dysuria, frequency and urgency. Negative for decreased urine volume, difficulty urinating, dyspareunia, flank pain, hematuria, vaginal bleeding, vaginal discharge and vaginal pain.  Musculoskeletal: Positive for back pain.  Neurological: Positive for light-headedness and headaches. Negative for dizziness.    Social History   Tobacco Use  . Smoking status: Current Every Day Smoker    Packs/day: 0.50    Years: 35.00    Pack years: 17.50  . Smokeless tobacco: Never Used  . Tobacco comment:    Substance Use Topics  . Alcohol use: Yes  Alcohol/week: 1.2 oz    Types: 2 Cans of beer per week   Objective:   BP (!) 146/72 (BP Location: Left Arm, Patient Position: Sitting, Cuff Size: Large)   Pulse 92   Temp 98.7 F (37.1 C) (Oral)   Wt 218 lb (98.9 kg)   LMP 04/16/2017   SpO2 98%   BMI 33.23 kg/m  Vitals:   04/21/17 1321  BP: (!) 146/72  Pulse: 92  Temp: 98.7 F (37.1 C)    TempSrc: Oral  SpO2: 98%  Weight: 218 lb (98.9 kg)     Physical Exam  Constitutional: She is oriented to person, place, and time. She appears well-developed and well-nourished.  HENT:  Right Ear: Tympanic membrane and external ear normal.  Left Ear: Tympanic membrane and external ear normal.  Mouth/Throat: Oropharynx is clear and moist. No oropharyngeal exudate.  Eyes: Conjunctivae are normal.  Neck: Neck supple.  Cardiovascular: Normal rate and regular rhythm.  Pulmonary/Chest: Effort normal. She has wheezes.  Abdominal: Soft. Bowel sounds are normal. She exhibits no distension. There is no tenderness.  Lymphadenopathy:    She has no cervical adenopathy.  Neurological: She is alert and oriented to person, place, and time.  Skin: Skin is warm and dry.  Psychiatric: She has a normal mood and affect. Her behavior is normal.        Assessment & Plan:     1. Bronchitis  - albuterol (PROVENTIL HFA;VENTOLIN HFA) 108 (90 Base) MCG/ACT inhaler; Inhale 2 puffs every 6 (six) hours as needed into the lungs for wheezing or shortness of breath.  Dispense: 1 Inhaler; Refill: 2  2. Cystitis  - doxycycline (VIBRA-TABS) 100 MG tablet; Take 1 tablet (100 mg total) 2 (two) times daily for 7 days by mouth.  Dispense: 14 tablet; Refill: 0 - POCT urinalysis dipstick - Urine Culture - CULTURE, URINE COMPREHENSIVE  Return in about 1 month (around 05/21/2017) for annual physical .  The entirety of the information documented in the History of Present Illness, Review of Systems and Physical Exam were personally obtained by me. Portions of this information were initially documented by Ashley Royalty, CMA and reviewed by me for thoroughness and accuracy.        Trinna Post, PA-C  [facility]

## 2017-04-22 LAB — POCT URINALYSIS DIPSTICK
Bilirubin, UA: NEGATIVE
Glucose, UA: NEGATIVE
Ketones, UA: NEGATIVE
Nitrite, UA: NEGATIVE
Spec Grav, UA: 1.02 (ref 1.010–1.025)
Urobilinogen, UA: 0.2 E.U./dL
pH, UA: 6 (ref 5.0–8.0)

## 2017-04-24 LAB — CULTURE, URINE COMPREHENSIVE
MICRO NUMBER:: 81279417
SPECIMEN QUALITY:: ADEQUATE

## 2017-04-28 ENCOUNTER — Telehealth: Payer: Self-pay

## 2017-04-28 NOTE — Telephone Encounter (Signed)
Patient reports that she is still having abdominal pain, but feels that the antibiotic may be contributing to her pain. This is different from what she experienced when she was here in the office. She reports that she is very bloated, gassy, and has no appetite. She also wanted to know if you were concerned at all about the protein that was found in her urine? She read that this could be an indication of kidney disease. Please advise. Thanks!

## 2017-04-28 NOTE — Telephone Encounter (Signed)
Left message to call back  

## 2017-04-28 NOTE — Telephone Encounter (Signed)
That does sound more attributable to the antibiotic, as long as her urinary symptoms have cleared. There can be some protein spillage into the urinary tract with urinary infections. Her kidney function as of last year was normal. However, she does need to return for a physical so we may run her maintenance labs. Thanks.

## 2017-04-28 NOTE — Telephone Encounter (Signed)
-----   Message from Trinna Post, [place] sent at 04/28/2017  2:11 PM EST ----- Urine culture positive for staph saprophyticus, sensitivities not performed. How are her symptoms?

## 2017-04-29 ENCOUNTER — Telehealth: Payer: Self-pay | Admitting: Obstetrics and Gynecology

## 2017-04-29 ENCOUNTER — Ambulatory Visit (INDEPENDENT_AMBULATORY_CARE_PROVIDER_SITE_OTHER): Payer: BLUE CROSS/BLUE SHIELD | Admitting: Obstetrics and Gynecology

## 2017-04-29 ENCOUNTER — Encounter: Payer: Self-pay | Admitting: Obstetrics and Gynecology

## 2017-04-29 VITALS — BP 124/74 | Ht 67.0 in | Wt 212.0 lb

## 2017-04-29 DIAGNOSIS — N920 Excessive and frequent menstruation with regular cycle: Secondary | ICD-10-CM | POA: Diagnosis not present

## 2017-04-29 DIAGNOSIS — Z1331 Encounter for screening for depression: Secondary | ICD-10-CM

## 2017-04-29 DIAGNOSIS — Z01419 Encounter for gynecological examination (general) (routine) without abnormal findings: Secondary | ICD-10-CM | POA: Diagnosis not present

## 2017-04-29 DIAGNOSIS — Z1339 Encounter for screening examination for other mental health and behavioral disorders: Secondary | ICD-10-CM

## 2017-04-29 DIAGNOSIS — N946 Dysmenorrhea, unspecified: Secondary | ICD-10-CM

## 2017-04-29 DIAGNOSIS — Z124 Encounter for screening for malignant neoplasm of cervix: Secondary | ICD-10-CM

## 2017-04-29 DIAGNOSIS — Z1231 Encounter for screening mammogram for malignant neoplasm of breast: Secondary | ICD-10-CM

## 2017-04-29 DIAGNOSIS — Z1239 Encounter for other screening for malignant neoplasm of breast: Secondary | ICD-10-CM

## 2017-04-29 LAB — HM PAP SMEAR: HM PAP: NORMAL

## 2017-04-29 NOTE — Telephone Encounter (Signed)
Patient scheduled 12/13 with [facility] for mirena insertion

## 2017-04-29 NOTE — Progress Notes (Signed)
Routine Annual Gynecology Examination   PCP: Trinna Post, PA-C  Chief Complaint  Patient presents with  . Annual Exam   History of Present Illness: Patient is a 51 y.o. G5P4 presents for annual exam. The patient has no complaints today.   Menses: monthly, every 28 days, very heavy lasting 7-8 days, passing clots, requiring 2 pads and a tampon.  Prior workup negative.  Needs pap smear.   Menopausal symptoms: denies  Breast symptoms: denies  Last pap smear: unknown  Last mammogram: 1 years ago.  Result Normal  Past Medical History:  Diagnosis Date  . Anemia   . Depression   . Vitamin B deficiency   . Vitamin D deficiency     Past Surgical History:  Procedure Laterality Date  . BREAST SURGERY  2002   Breast reduction -both  . CESAREAN SECTION  1990  . CHOLECYSTECTOMY  1991  . COLONOSCOPY WITH PROPOFOL N/A 12/21/2015   Procedure: COLONOSCOPY WITH PROPOFOL;  Surgeon: Lucilla Lame, MD;  Location: [facility];  Service: Endoscopy;  Laterality: N/A;  . HERNIA REPAIR  1980  . REDUCTION MAMMAPLASTY Bilateral 2000  . right shoulder surgery Right 11/07/15   shoulder scope;   . SHOULDER SURGERY Right 05/09/2009    Medications   Medication Sig Start Date End Date Taking? Authorizing Provider  acetaminophen (TYLENOL) 500 MG tablet Take 500 mg by mouth every 6 (six) hours as needed.    [provider]  albuterol (PROVENTIL HFA;VENTOLIN HFA) 108 (90 Base) MCG/ACT inhaler Inhale 2 puffs every 6 (six) hours as needed into the lungs for wheezing or shortness of breath. 04/21/17   Trinna Post, PA-C  cyanocobalamin 2000 MCG tablet Take 2,000 mcg by mouth daily.    [provider]  ferrous sulfate 325 (65 FE) MG tablet Take 325 mg by mouth daily with breakfast.    [provider]  Vitamin D, Ergocalciferol, (DRISDOL) 50000 units CAPS capsule Take 1 capsule (50,000 Units total) by mouth every 7 (seven) days. 12/06/15   Margarita Rana, MD     Allergies  Allergen Reactions  . Scallops [Shellfish Allergy] Nausea And Vomiting   Obstetric History: G5P4  Social History   Socioeconomic History  . Marital status: Soil scientist    Spouse name: Not on file  . Number of children: Not on file  . Years of education: Not on file  . Highest education level: Not on file  Social Needs  . Financial resource strain: Not on file  . Food insecurity - worry: Not on file  . Food insecurity - inability: Not on file  . Transportation needs - medical: Not on file  . Transportation needs - non-medical: Not on file  Occupational History  . Not on file  Tobacco Use  . Smoking status: Current Every Day Smoker    Packs/day: 0.50    Years: 35.00    Pack years: 17.50  . Smokeless tobacco: Never Used  . Tobacco comment:    Substance and Sexual Activity  . Alcohol use: Yes    Alcohol/week: 1.2 oz    Types: 2 Cans of beer per week  . Drug use: No  . Sexual activity: Not on file  Other Topics Concern  . Not on file  Social History Narrative  . Not on file    Family History  Problem Relation Age of Onset  . Hypertension Other   . Diabetes Other        Diabetes Mellitus type 2  .  Diabetes Mother     Review of Systems  Constitutional: Negative.   HENT: Negative.   Eyes: Negative.   Respiratory: Negative.   Cardiovascular: Negative.   Gastrointestinal: Negative.   Genitourinary: Negative.   Musculoskeletal: Negative.   Skin: Negative.   Neurological: Negative.   Psychiatric/Behavioral: Negative.      Physical Exam Vitals: BP 124/74   Ht 5\' 7"  (1.702 m)   Wt 212 lb (96.2 kg)   LMP 04/16/2017   BMI 33.20 kg/m   Physical Exam  Constitutional: She is oriented to person, place, and time. She appears well-developed and well-nourished. No distress.  Eyes: EOM are normal. No scleral icterus.  Neck: Normal range of motion. Neck supple.  Cardiovascular: Normal rate and regular rhythm.  Pulmonary/Chest: Effort normal and  breath sounds normal. No respiratory distress. She has no wheezes. She has no rales.  Abdominal: Soft. Bowel sounds are normal. She exhibits no distension and no mass. There is no tenderness. There is no rebound and no guarding.  Musculoskeletal: Normal range of motion. She exhibits no edema.  Neurological: She is alert and oriented to person, place, and time. No cranial nerve deficit.  Skin: Skin is warm and dry. No erythema.  Psychiatric: She has a normal mood and affect. Her behavior is normal. Judgment normal.     Female chaperone present for pelvic and breast  portions of the physical exam  Results: AUDIT Questionnaire (screen for alcoholism): 2 PHQ-9: 2   Assessment and Plan:  51 y.o. G73P4 female here for routine annual gynecologic examination  Plan: Problem List Items Addressed This Visit    Menorrhagia with regular cycle   Relevant Orders   IGP,CtNg,AptimaHPV,rfx16/18,45   Dysmenorrhea   Relevant Orders   IGP,CtNg,AptimaHPV,rfx16/18,45    Other Visit Diagnoses    Women's annual routine gynecological examination    -  Primary   Relevant Orders   IGP,CtNg,AptimaHPV,rfx16/18,45   MM SCREENING BREAST TOMO BILATERAL   Screening for depression       Screening for alcohol problem       Pap smear for cervical cancer screening       Relevant Orders   IGP,CtNg,AptimaHPV,rfx16/18,45   Screening for breast cancer       Relevant Orders   MM SCREENING BREAST TOMO BILATERAL      Screening: -- Blood pressure screen normal -- Colonoscopy - due - managed by PCP -- Mammogram - due. Patient to call [facility] to arrange. She understands that it is her responsibility to arrange this. -- Weight screening: obese: discussed management options, including lifestyle, dietary, and exercise. -- Depression screening negative (PHQ-9) -- Nutrition: normal -- cholesterol screening: per PCP -- osteoporosis screening: not due -- tobacco screening: using: discussed quitting using the 5 A's --  alcohol screening: AUDIT questionnaire indicates low-risk usage. -- family history of breast cancer screening: done. not at high risk. -- no evidence of domestic violence or intimate partner violence. -- STD screening: gonorrhea/chlamydia NAAT not collected per patient request. -- pap smear collected per ASCCP guidelines -- flu vaccine received today -- HPV vaccination series: not eligilbe   Prentice Docker, MD 04/29/2017 3:12 PM

## 2017-04-30 ENCOUNTER — Encounter: Payer: Self-pay | Admitting: Obstetrics and Gynecology

## 2017-05-05 LAB — IGP,CTNG,APTIMAHPV,RFX16/18,45
Chlamydia, Nuc. Acid Amp: NEGATIVE
Gonococcus by Nucleic Acid Amp: NEGATIVE
HPV Aptima: NEGATIVE
PAP SMEAR COMMENT: 0

## 2017-05-22 ENCOUNTER — Ambulatory Visit: Payer: BLUE CROSS/BLUE SHIELD | Admitting: Obstetrics and Gynecology

## 2017-06-17 ENCOUNTER — Ambulatory Visit
Admission: RE | Admit: 2017-06-17 | Discharge: 2017-06-17 | Disposition: A | Discharge status: Home / Self Care | Payer: BLUE CROSS/BLUE SHIELD | Source: Ambulatory Visit | Attending: Obstetrics and Gynecology | Admitting: Obstetrics and Gynecology

## 2017-06-17 DIAGNOSIS — Z1231 Encounter for screening mammogram for malignant neoplasm of breast: Secondary | ICD-10-CM | POA: Diagnosis not present

## 2017-06-17 DIAGNOSIS — Z1239 Encounter for other screening for malignant neoplasm of breast: Secondary | ICD-10-CM

## 2017-06-17 DIAGNOSIS — Z01419 Encounter for gynecological examination (general) (routine) without abnormal findings: Secondary | ICD-10-CM

## 2017-07-29 ENCOUNTER — Encounter: Payer: Self-pay | Admitting: Advanced Practice Midwife

## 2017-07-29 ENCOUNTER — Ambulatory Visit: Payer: BLUE CROSS/BLUE SHIELD | Admitting: Advanced Practice Midwife

## 2017-07-29 VITALS — BP 156/80 | HR 72 | Ht 67.5 in | Wt 217.0 lb

## 2017-07-29 DIAGNOSIS — N920 Excessive and frequent menstruation with regular cycle: Secondary | ICD-10-CM

## 2017-07-30 ENCOUNTER — Other Ambulatory Visit: Payer: Self-pay | Admitting: Advanced Practice Midwife

## 2017-07-30 ENCOUNTER — Encounter: Payer: Self-pay | Admitting: Advanced Practice Midwife

## 2017-07-30 DIAGNOSIS — N920 Excessive and frequent menstruation with regular cycle: Secondary | ICD-10-CM

## 2017-07-30 LAB — CBC WITH DIFFERENTIAL/PLATELET
BASOS: 1 %
Basophils Absolute: 0.1 10*3/uL (ref 0.0–0.2)
EOS (ABSOLUTE): 0.3 10*3/uL (ref 0.0–0.4)
Eos: 3 %
HEMATOCRIT: 32.3 % — AB (ref 34.0–46.6)
Hemoglobin: 10.1 g/dL — ABNORMAL LOW (ref 11.1–15.9)
IMMATURE GRANULOCYTES: 0 %
Immature Grans (Abs): 0 10*3/uL (ref 0.0–0.1)
LYMPHS ABS: 3.6 10*3/uL — AB (ref 0.7–3.1)
Lymphs: 37 %
MCH: 24.6 pg — AB (ref 26.6–33.0)
MCHC: 31.3 g/dL — AB (ref 31.5–35.7)
MCV: 79 fL (ref 79–97)
MONOS ABS: 0.7 10*3/uL (ref 0.1–0.9)
Monocytes: 7 %
NEUTROS ABS: 5.1 10*3/uL (ref 1.4–7.0)
NEUTROS PCT: 52 %
PLATELETS: 873 10*3/uL — AB (ref 150–379)
RBC: 4.1 x10E6/uL (ref 3.77–5.28)
RDW: 17.5 % — ABNORMAL HIGH (ref 12.3–15.4)
WBC: 9.7 10*3/uL (ref 3.4–10.8)

## 2017-07-30 MED ORDER — MEDROXYPROGESTERONE ACETATE 10 MG PO TABS: 10.0000 mg | ORAL_TABLET | Freq: Every day | ORAL | 2 refills | Status: DC

## 2017-07-30 NOTE — Progress Notes (Signed)
S: The patient is here today with complaint of heavy bleeding with clots x 3 weeks. She normally has a heavy period every month that lasts for 7 days. In December she had 2 periods. On January 12 she had spotting that lasted for 3 days and then beginning on January 30 she started bleeding heavily and it has not stopped yet. She wears 2 pads and a tampon that she changes every 4 hours. She states she is not worried about the bleeding, that she can deal with that. Her main concern is her platelet level. She has a history of thrombocytosis with platelets ranging from 400,000 to 900,000 over the last couple of years. She is followed by oncology for her thrombocytosis. She was seen by Dr Glennon Mac in November for a well woman visit. At that time he suggested a Mirena IUD for control of heavy bleeding. I suggested medication management at the visit today but she claimed she could not use progesterone due to her increased risk for clotting. She may have been confusing that with estrogen but the subject was not brought up due to the timing of the visit and her main concern being a lab draw to check platelets. She did remember that Dr Glennon Mac suggested the Mirena by the end of our visit and did not seem totally opposed to the idea. I will discuss lab results and hormone treatment with follow up phone call.  O: Vital Signs: BP (!) 156/80   Pulse 72   Ht 5' 7.5" (1.715 m)   Wt 217 lb (98.4 kg)   LMP 07/09/2017 Comment: heavy flow  BMI 33.49 kg/m  Constitutional: Well nourished, well developed female in no acute distress.  HEENT: normal Skin: Warm and dry.  Cardiovascular: Regular rate and rhythm.   Respiratory: Clear to auscultation bilateral. Normal respiratory effort Psych: Alert and Oriented x3. No memory deficits. Normal mood and affect.  MS: normal gait, normal bilateral lower extremity ROM/strength/stability.  Pelvic exam:  is not limited by body habitus EGBUS: within normal limits Vagina: within normal  limits and with normal mucosa, blood in the vault Cervix: no CMT Uterus: not tender to palpation, not enlarged, mobile Adnexa: not tender to palpation, not enlarged  A: 52 yo pre-menopausal female with menorrhagia  P: CBC lab draw PRN follow up for possible hormone treatment with Mirena IUD  Rod Can, CNM

## 2017-07-30 NOTE — Progress Notes (Signed)
Rx Provera sent to patient pharmacy for control of heavy/prolonged bleeding.

## 2017-08-05 NOTE — Telephone Encounter (Signed)
Pt cancelled apt via automated system.

## 2018-05-04 ENCOUNTER — Ambulatory Visit (INDEPENDENT_AMBULATORY_CARE_PROVIDER_SITE_OTHER): Payer: BLUE CROSS/BLUE SHIELD | Admitting: Obstetrics and Gynecology

## 2018-05-04 ENCOUNTER — Encounter: Payer: Self-pay | Admitting: Obstetrics and Gynecology

## 2018-05-04 VITALS — BP 148/96 | Ht 67.0 in | Wt 228.0 lb

## 2018-05-04 DIAGNOSIS — Z1339 Encounter for screening examination for other mental health and behavioral disorders: Secondary | ICD-10-CM

## 2018-05-04 DIAGNOSIS — Z1331 Encounter for screening for depression: Secondary | ICD-10-CM

## 2018-05-04 DIAGNOSIS — D473 Essential (hemorrhagic) thrombocythemia: Secondary | ICD-10-CM

## 2018-05-04 DIAGNOSIS — Z01419 Encounter for gynecological examination (general) (routine) without abnormal findings: Secondary | ICD-10-CM

## 2018-05-04 DIAGNOSIS — D75839 Thrombocytosis, unspecified: Secondary | ICD-10-CM

## 2018-05-04 DIAGNOSIS — Z0000 Encounter for general adult medical examination without abnormal findings: Secondary | ICD-10-CM

## 2018-05-04 NOTE — Progress Notes (Signed)
Routine Annual Gynecology Examination   PCP: Trinna Post, PA-C  Chief Complaint  Patient presents with  . Annual Exam   History of Present Illness: Patient is a 52 y.o. G5P4 presents for annual exam. The patient has no complaints today.   Menses: She has a menses that come irregularly.  Some months she has no menses. Other months she will have elongated menses.  Since February her menses have lasted 7-8 days, if they come at all.  She does pass clots.  The menses are less painful than before.  For example, she had a menses 11/17, none in October, she did have one in September.  She has no intermenstrual bleeding.  She was given Provera in February for her heavy menses.  She only took the medication for one round.   Menopausal symptoms: reports hot flashes.   Breast symptoms: denies  Last pap smear: 1 year ago.  Result Normal, HPV negative  Last mammogram: 10 months ago.  Result Normal   Last Colonoscopy: 12/2015 - due at age 19 yo.   She has a history of thrombocytosis. Her last platelet count was in the 800s.  She is told by a hematologist that her platelets were related to her menses.   Past Medical History:  Diagnosis Date  . Anemia   . Depression   . Family history of ovarian cancer   . Vitamin B deficiency   . Vitamin D deficiency     Past Surgical History:  Procedure Laterality Date  . BREAST SURGERY  2002   Breast reduction -both  . CESAREAN SECTION  1990  . CHOLECYSTECTOMY  1991  . COLONOSCOPY WITH PROPOFOL N/A 12/21/2015   Procedure: COLONOSCOPY WITH PROPOFOL;  Surgeon: Lucilla Lame, MD;  Location: [facility];  Service: Endoscopy;  Laterality: N/A;  . HERNIA REPAIR  1980  . REDUCTION MAMMAPLASTY Bilateral 2000  . right shoulder surgery Right 11/07/15   shoulder scope;   . SHOULDER SURGERY Right 05/09/2009   Prior to Admission medications   Medication Sig Start Date End Date Taking? Authorizing Provider  albuterol (PROVENTIL HFA;VENTOLIN HFA)  108 (90 Base) MCG/ACT inhaler Inhale 2 puffs every 6 (six) hours as needed into the lungs for wheezing or shortness of breath. 04/21/17  Yes Carles Collet M, PA-C  cyanocobalamin 2000 MCG tablet Take 2,000 mcg by mouth daily.   Yes [provider]  ferrous sulfate 325 (65 FE) MG tablet Take 325 mg by mouth daily with breakfast.   Yes [provider]  Vitamin D, Ergocalciferol, (DRISDOL) 50000 units CAPS capsule Take 1 capsule (50,000 Units total) by mouth every 7 (seven) days. 12/06/15  Yes Margarita Rana, MD  acetaminophen (TYLENOL) 500 MG tablet Take 500 mg by mouth every 6 (six) hours as needed.    [provider]    Allergies  Allergen Reactions  . Scallops [Shellfish Allergy] Nausea And Vomiting    Obstetric History: G5P4  Social History   Socioeconomic History  . Marital status: Soil scientist    Spouse name: Not on file  . Number of children: Not on file  . Years of education: Not on file  . Highest education level: Not on file  Occupational History  . Not on file  Social Needs  . Financial resource strain: Not on file  . Food insecurity:    Worry: Not on file    Inability: Not on file  . Transportation needs:    Medical: Not on file  Non-medical: Not on file  Tobacco Use  . Smoking status: Current Every Day Smoker    Packs/day: 0.50    Years: 35.00    Pack years: 17.50  . Smokeless tobacco: Never Used  . Tobacco comment:    Substance and Sexual Activity  . Alcohol use: Yes    Alcohol/week: 2.0 standard drinks    Types: 2 Cans of beer per week  . Drug use: No  . Sexual activity: Yes    Birth control/protection: None  Lifestyle  . Physical activity:    Days per week: Not on file    Minutes per session: Not on file  . Stress: Not on file  Relationships  . Social connections:    Talks on phone: Not on file    Gets together: Not on file    Attends religious service: Not on file    Active member of club or organization: Not on  file    Attends meetings of clubs or organizations: Not on file    Relationship status: Not on file  . Intimate partner violence:    Fear of current or ex partner: Not on file    Emotionally abused: Not on file    Physically abused: Not on file    Forced sexual activity: Not on file  Other Topics Concern  . Not on file  Social History Narrative  . Not on file    Family History  Problem Relation Age of Onset  . Hypertension Other   . Diabetes Other        Diabetes Mellitus type 2  . Diabetes Mother   . Ovarian cancer Maternal Grandmother 76  . Breast cancer Neg Hx     Review of Systems  Constitutional: Negative.   HENT: Negative.   Eyes: Negative.   Respiratory: Negative.   Cardiovascular: Negative.   Gastrointestinal: Negative.   Genitourinary: Negative.   Musculoskeletal: Negative.   Skin: Negative.   Neurological: Negative.   Psychiatric/Behavioral: Negative.      Physical Exam Vitals: BP (!) 148/96   Ht 5\' 7"  (1.702 m)   Wt 228 lb (103.4 kg)   LMP 04/26/2018   BMI 35.71 kg/m   Physical Exam  Constitutional: She is oriented to person, place, and time. She appears well-developed and well-nourished. No distress.  Genitourinary: Uterus normal. Pelvic exam was performed with patient supine. There is no rash, tenderness, lesion or injury on the right labia. There is no rash, tenderness, lesion or injury on the left labia. No erythema, tenderness or bleeding in the vagina. No signs of injury around the vagina. No vaginal discharge found. Right adnexum does not display mass, does not display tenderness and does not display fullness. Left adnexum does not display mass, does not display tenderness and does not display fullness. Cervix does not exhibit motion tenderness, lesion, discharge or polyp.   Uterus is mobile and anteverted. Uterus is not enlarged, tender or exhibiting a mass.  HENT:  Head: Normocephalic and atraumatic.  Eyes: EOM are normal. No scleral icterus.    Neck: Normal range of motion. Neck supple. No thyromegaly present.  Cardiovascular: Normal rate and regular rhythm. Exam reveals no gallop and no friction rub.  No murmur heard. Pulmonary/Chest: Effort normal and breath sounds normal. No respiratory distress. She has no wheezes. She has no rales. Right breast exhibits no inverted nipple, no mass, no nipple discharge, no skin change and no tenderness. Left breast exhibits no inverted nipple, no mass, no nipple discharge, no skin  change and no tenderness.  Abdominal: Soft. Bowel sounds are normal. She exhibits no distension and no mass. There is no tenderness. There is no rebound and no guarding.  Musculoskeletal: Normal range of motion. She exhibits no edema or tenderness.  Lymphadenopathy:    She has no cervical adenopathy.       Right: No inguinal adenopathy present.       Left: No inguinal adenopathy present.  Neurological: She is alert and oriented to person, place, and time. No cranial nerve deficit.  Skin: Skin is warm and dry. No rash noted. No erythema.  Psychiatric: She has a normal mood and affect. Her behavior is normal. Judgment normal.     Female chaperone present for pelvic and breast  portions of the physical exam  Results: AUDIT Questionnaire (screen for alcoholism): 2 PHQ-9: 2   Assessment and Plan:  52 y.o. G76P4 female here for routine annual gynecologic examination  Plan: Problem List Items Addressed This Visit      Hematopoietic and Hemostatic   Thrombocytosis ([place])   Relevant Orders   CBC with Differential/Platelet    Other Visit Diagnoses    Women's annual routine gynecological examination    -  Primary   Relevant Orders   CBC with Differential/Platelet   Screening for depression       Screening for alcoholism       Laboratory examination ordered as part of a routine general medical examination       Relevant Orders   CBC with Differential/Platelet     Screening: -- Blood pressure screen managed by  PCP. Elevated today. Recommend she continue to monitor this through her PCP -- Colonoscopy - not due -- Mammogram - due in January. But, up to date as of today. Encouraged her to arrange for January -- Weight screening: obese: discussed management options, including lifestyle, dietary, and exercise. -- Depression screening negative (PHQ-9) -- Nutrition: normal -- cholesterol screening: per PCP -- osteoporosis screening: not due -- tobacco screening: using: discussed quitting using the 5 A's -- alcohol screening: AUDIT questionnaire indicates low-risk usage. -- family history of breast cancer screening: done. not at high risk. -- no evidence of domestic violence or intimate partner violence. -- STD screening: gonorrhea/chlamydia NAAT not collected per patient request. -- pap smear not collected per ASCCP guidelines -- flu vaccine would like one today. Injection given.  -- HPV vaccination series: not eligilbe  Thrombocytosis:  CBC to continue monitoring.   Prentice Docker, MD 05/04/2018 9:12 AM

## 2018-05-05 LAB — CBC WITH DIFFERENTIAL/PLATELET
BASOS: 1 %
Basophils Absolute: 0.1 10*3/uL (ref 0.0–0.2)
EOS (ABSOLUTE): 0.4 10*3/uL (ref 0.0–0.4)
EOS: 4 %
HEMOGLOBIN: 14 g/dL (ref 11.1–15.9)
Hematocrit: 39.6 % (ref 34.0–46.6)
Immature Grans (Abs): 0.1 10*3/uL (ref 0.0–0.1)
Immature Granulocytes: 1 %
LYMPHS ABS: 2.4 10*3/uL (ref 0.7–3.1)
Lymphs: 24 %
MCH: 32.2 pg (ref 26.6–33.0)
MCHC: 35.4 g/dL (ref 31.5–35.7)
MCV: 91 fL (ref 79–97)
MONOCYTES: 7 %
Monocytes Absolute: 0.7 10*3/uL (ref 0.1–0.9)
Neutrophils Absolute: 6.4 10*3/uL (ref 1.4–7.0)
Neutrophils: 63 %
Platelets: 587 10*3/uL — ABNORMAL HIGH (ref 150–450)
RBC: 4.35 x10E6/uL (ref 3.77–5.28)
RDW: 13.1 % (ref 12.3–15.4)
WBC: 10.1 10*3/uL (ref 3.4–10.8)

## 2018-05-17 ENCOUNTER — Other Ambulatory Visit: Payer: Self-pay | Admitting: Physician Assistant

## 2018-05-17 DIAGNOSIS — J40 Bronchitis, not specified as acute or chronic: Secondary | ICD-10-CM

## 2018-05-18 ENCOUNTER — Ambulatory Visit: Payer: BLUE CROSS/BLUE SHIELD | Admitting: Family Medicine

## 2018-05-18 ENCOUNTER — Encounter: Payer: Self-pay | Admitting: Family Medicine

## 2018-05-18 VITALS — BP 130/80 | HR 65 | Temp 98.2°F | Resp 16 | Wt 226.0 lb

## 2018-05-18 DIAGNOSIS — R42 Dizziness and giddiness: Secondary | ICD-10-CM | POA: Diagnosis not present

## 2018-05-18 MED ORDER — MECLIZINE HCL 12.5 MG PO TABS: 12.5000 mg | ORAL_TABLET | Freq: Three times a day (TID) | ORAL | 0 refills | Status: DC | PRN

## 2018-05-18 NOTE — Progress Notes (Signed)
Patient: Anna Bates Female    DOB: [birth year]   52 y.o.   MRN: [id number] Visit Date: 05/18/2018  Today's Provider: Vernie Murders, PA   Chief Complaint  Patient presents with  . Dizziness   Subjective:    Dizziness  This is a new problem. The current episode started in the past 7 days (3 days). The problem occurs intermittently. The problem has been unchanged. Associated symptoms include neck pain and vertigo. Pertinent negatives include no abdominal pain, anorexia, arthralgias, change in bowel habit, chest pain, chills, congestion, coughing, diaphoresis, fatigue, fever, headaches, joint swelling, myalgias, nausea, numbness, rash, sore throat, swollen glands, urinary symptoms, visual change, vomiting or weakness. The symptoms are aggravated by standing and bending.    Patient stated for the past 3 days she has had dizziness when she stands. Patient states she is off balance and dizzy. Patient also states she went on a flight 2 days ago and came back yesterday. Patient states she had dizziness before her flight. Patient states dizziness is intermittent. She has had a vertigo sensation occasionally. Patient states she had cough and congestion 2 weeks but that has cleared up now. Patient also states she has a pressure in her head, that started at the same time as the dizziness.   Past Medical History:  Diagnosis Date  . Anemia   . Depression   . Family history of ovarian cancer   . Vitamin B deficiency   . Vitamin D deficiency    Past Surgical History:  Procedure Laterality Date  . BREAST SURGERY  2002   Breast reduction -both  . CESAREAN SECTION  1990  . CHOLECYSTECTOMY  1991  . COLONOSCOPY WITH PROPOFOL N/A 12/21/2015   Procedure: COLONOSCOPY WITH PROPOFOL;  Surgeon: Lucilla Lame, MD;  Location: [facility];  Service: Endoscopy;  Laterality: N/A;  . HERNIA REPAIR  1980  . REDUCTION MAMMAPLASTY Bilateral 2000  . right shoulder surgery Right 11/07/15   shoulder scope;   . SHOULDER SURGERY Right 05/09/2009   Family History  Problem Relation Age of Onset  . Hypertension Other   . Diabetes Other        Diabetes Mellitus type 2  . Diabetes Mother   . Ovarian cancer Maternal Grandmother 64  . Breast cancer Neg Hx    Allergies  Allergen Reactions  . Scallops [Shellfish Allergy] Nausea And Vomiting    Current Outpatient Medications:  .  acetaminophen (TYLENOL) 500 MG tablet, Take 500 mg by mouth every 6 (six) hours as needed., Disp: , Rfl:  .  albuterol (PROVENTIL HFA;VENTOLIN HFA) 108 (90 Base) MCG/ACT inhaler, Inhale 2 puffs every 6 (six) hours as needed into the lungs for wheezing or shortness of breath., Disp: 1 Inhaler, Rfl: 2 .  BIOTIN PO, Take by mouth., Disp: , Rfl:  .  Clarithromycin (BIAXIN PO), Take by mouth., Disp: , Rfl:  .  cyanocobalamin 2000 MCG tablet, Take 2,000 mcg by mouth daily., Disp: , Rfl:  .  ferrous sulfate 325 (65 FE) MG tablet, Take 325 mg by mouth daily with breakfast., Disp: , Rfl:  .  Vitamin D, Ergocalciferol, (DRISDOL) 50000 units CAPS capsule, Take 1 capsule (50,000 Units total) by mouth every 7 (seven) days., Disp: 12 capsule, Rfl: 1  Review of Systems  Constitutional: Negative for appetite change, chills, diaphoresis, fatigue and fever.  HENT: Negative for congestion and sore throat.   Respiratory: Negative for cough, chest tightness and shortness of breath.   Cardiovascular:  Negative for chest pain and palpitations.  Gastrointestinal: Negative for abdominal pain, anorexia, change in bowel habit, nausea and vomiting.  Musculoskeletal: Positive for neck pain. Negative for arthralgias, joint swelling and myalgias.  Skin: Negative for rash.  Neurological: Positive for dizziness and vertigo. Negative for weakness, numbness and headaches.   Social History   Tobacco Use  . Smoking status: Current Every Day Smoker    Packs/day: 0.50    Years: 35.00    Pack years: 17.50  . Smokeless tobacco: Never  Used  . Tobacco comment:    Substance Use Topics  . Alcohol use: Yes    Alcohol/week: 2.0 standard drinks    Types: 2 Cans of beer per week   Objective:   BP 130/80 (BP Location: Right Arm, Patient Position: Sitting, Cuff Size: Large)   Pulse 65   Temp 98.2 F (36.8 C) (Oral)   Resp 16   Wt 226 lb (102.5 kg)   LMP 04/26/2018   SpO2 99%   BMI 35.40 kg/m  Vitals:   05/18/18 0954  BP: 130/80  Pulse: 65  Resp: 16  Temp: 98.2 F (36.8 C)  TempSrc: Oral  SpO2: 99%  Weight: 226 lb (102.5 kg)   Physical Exam  Constitutional: She is oriented to person, place, and time. She appears well-developed and well-nourished. No distress.  HENT:  Head: Normocephalic and atraumatic.  Right Ear: Hearing and external ear normal.  Left Ear: Hearing and external ear normal.  Nose: Nose normal.  Mouth/Throat: Oropharynx is clear and moist.  Eyes: Conjunctivae and lids are normal. Right eye exhibits no discharge. Left eye exhibits no discharge. No scleral icterus.  Neck: Neck supple.  Cardiovascular: Normal rate and regular rhythm.  Pulmonary/Chest: Effort normal and breath sounds normal. No respiratory distress.  Abdominal: Soft.  Musculoskeletal: Normal range of motion.  Neurological: She is alert and oriented to person, place, and time.  Skin: Skin is intact. No lesion and no rash noted.  Psychiatric: She has a normal mood and affect. Her speech is normal and behavior is normal. Thought content normal.      Assessment & Plan:     1. Vertigo Intermittent dizziness without nausea or vomiting for the past 3 days. Slight head pressure but no carotid bruits, irregular heart beat, congestion or cough. Had a URI 2 weeks ago that has resolved. Went on a flight to [place] on 05-16-18. Feel off balance to bend over and stand up but lasts only 5-10 seconds. Feel better to lie down and rest. No nystagmus to [facility] test. Will treat with Antivert and may use Flonase if any nasal congestion  develops. Out of work 05-18-18 through 05-20-18 due to dizziness and possible drowsiness from Meclizine. Increase fluids intake and recheck if no better in 3-5 days.  - meclizine (ANTIVERT) 12.5 MG tablet; Take 1 tablet (12.5 mg total) by mouth 3 (three) times daily as needed for dizziness.  Dispense: 30 tablet; Refill: Alexander, PA  [facility]

## 2018-05-18 NOTE — Patient Instructions (Signed)
Dizziness °Dizziness is a common problem. It is a feeling of unsteadiness or light-headedness. You may feel like you are about to faint. Dizziness can lead to injury if you stumble or fall. Anyone can become dizzy, but dizziness is more common in older adults. This condition can be caused by a number of things, including medicines, dehydration, or illness. °Follow these instructions at home: °Eating and drinking °· Drink enough fluid to keep your urine clear or pale yellow. This helps to keep you from becoming dehydrated. Try to drink more clear fluids, such as water. °· Do not drink alcohol. °· Limit your caffeine intake if told to do so by your health care provider. Check ingredients and nutrition facts to see if a food or beverage contains caffeine. °· Limit your salt (sodium) intake if told to do so by your health care provider. Check ingredients and nutrition facts to see if a food or beverage contains sodium. °Activity °· Avoid making quick movements. °? Rise slowly from chairs and steady yourself until you feel okay. °? In the morning, first sit up on the side of the bed. When you feel okay, stand slowly while you hold onto something until you know that your balance is fine. °· If you need to stand in one place for a long time, move your legs often. Tighten and relax the muscles in your legs while you are standing. °· Do not drive or use heavy machinery if you feel dizzy. °· Avoid bending down if you feel dizzy. Place items in your home so that they are easy for you to reach without leaning over. °Lifestyle °· Do not use any products that contain nicotine or tobacco, such as cigarettes and e-cigarettes. If you need help quitting, ask your health care provider. °· Try to reduce your stress level by using methods such as yoga or meditation. Talk with your health care provider if you need help to manage your stress. °General instructions °· Watch your dizziness for any changes. °· Take over-the-counter and  prescription medicines only as told by your health care provider. Talk with your health care provider if you think that your dizziness is caused by a medicine that you are taking. °· Tell a friend or a family member that you are feeling dizzy. If he or she notices any changes in your behavior, have this person call your health care provider. °· Keep all follow-up visits as told by your health care provider. This is important. °Contact a health care provider if: °· Your dizziness does not go away. °· Your dizziness or light-headedness gets worse. °· You feel nauseous. °· You have reduced hearing. °· You have new symptoms. °· You are unsteady on your feet or you feel like the room is spinning. °Get help right away if: °· You vomit or have diarrhea and are unable to eat or drink anything. °· You have problems talking, walking, swallowing, or using your arms, hands, or legs. °· You feel generally weak. °· You are not thinking clearly or you have trouble forming sentences. It may take a friend or family member to notice this. °· You have chest pain, abdominal pain, shortness of breath, or sweating. °· Your vision changes. °· You have any bleeding. °· You have a severe headache. °· You have neck pain or a stiff neck. °· You have a fever. °These symptoms may represent a serious problem that is an emergency. Do not wait to see if the symptoms will go away. Get medical help   right away. Call your local emergency services (911 in the [place] Do not drive yourself to the hospital. °Summary °· Dizziness is a feeling of unsteadiness or light-headedness. This condition can be caused by a number of things, including medicines, dehydration, or illness. °· Anyone can become dizzy, but dizziness is more common in older adults. °· Drink enough fluid to keep your urine clear or pale yellow. Do not drink alcohol. °· Avoid making quick movements if you feel dizzy. Monitor your dizziness for any changes. °This information is not intended to  replace advice given to you by your health care provider. Make sure you discuss any questions you have with your health care provider. °Document Released: 11/20/2000 Document Revised: 06/29/2016 Document Reviewed: 06/29/2016 °Elsevier Interactive Patient Education © 2018 [employer] ° °

## 2018-06-20 IMAGING — US US BREAST*L* LIMITED INC AXILLA
1 series · 3 of 3 positions shown · non-contrast
Comparison: Previous exam(s).

CLINICAL DATA: 50-year-old female presenting for evaluation of a
tender palpable area of concern in the lateral aspect of the left
breast. The patient states that this increases just prior to her
menstrual cycle and then decreases or resolves a few days following
her menstrual cycle. Patient's history of bilateral reduction
mammoplasty in 3111.

EXAM:
2D DIGITAL DIAGNOSTIC BILATERAL MAMMOGRAM WITH CAD AND ADJUNCT TOMO
LEFT BREAST ULTRASOUND

[Series 1: us breast*left* limited inc axilla · 0.09mm/px · 3 of 3 slices shown]
[im 1/3]
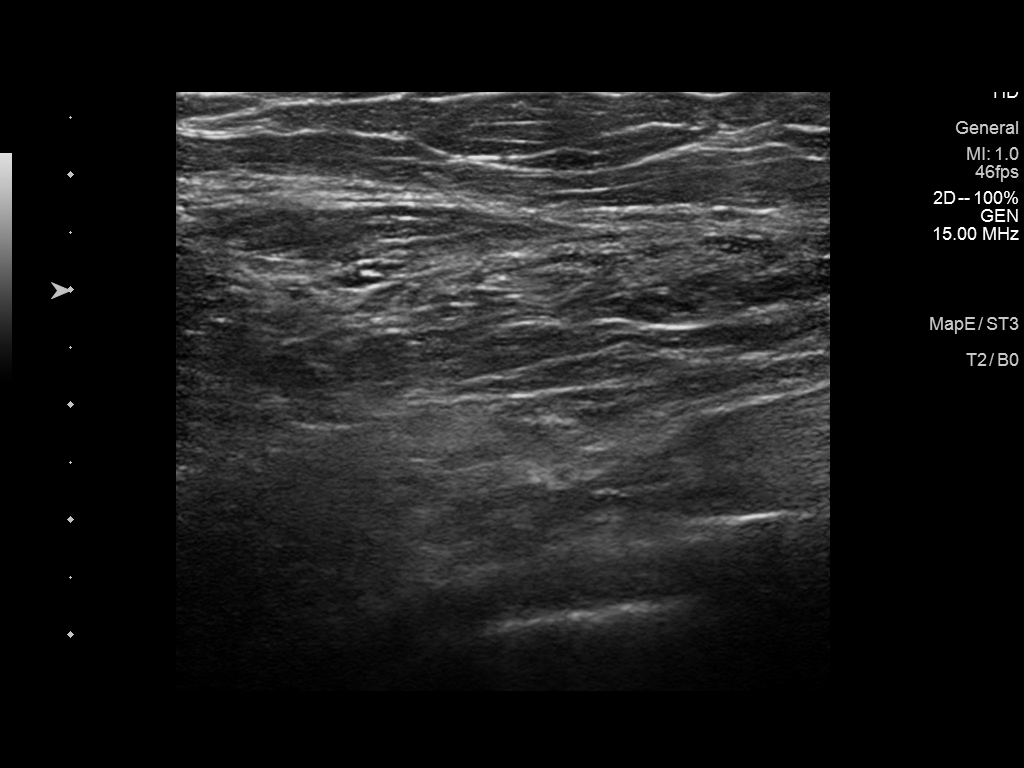
[im 2/3]
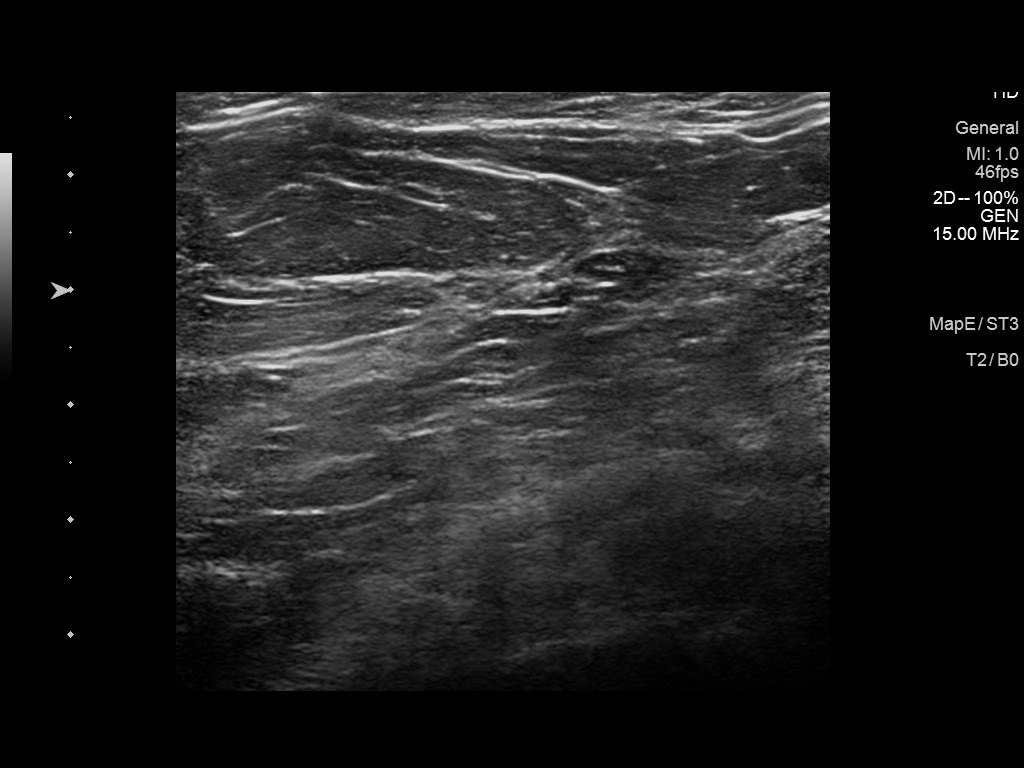
[im 3/3]
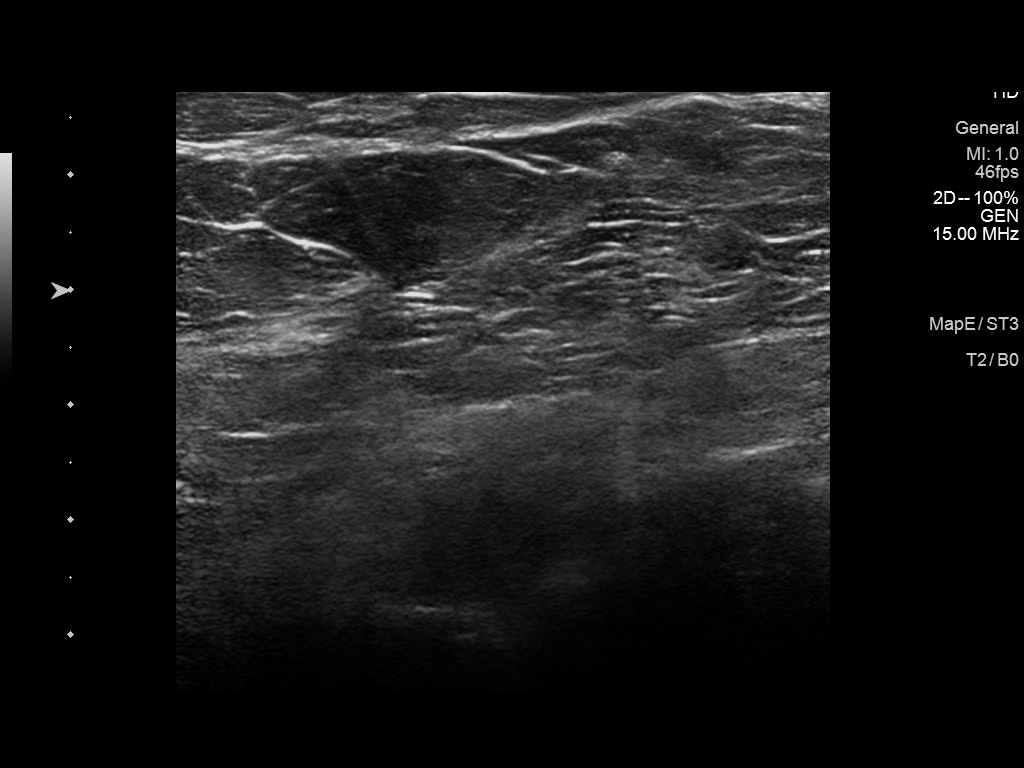

[3 of 3 positions shown; findings below may reference images not displayed]

ACR Breast Density Category b: There are scattered areas of
fibroglandular density.
FINDINGS: No suspicious calcifications, masses or areas of distortion are seen
in the bilateral breasts.

Mammographic images were processed with CAD.

No discrete palpable mass is identified along the lateral aspect of
the left breast, though tenderness is elicited with palpation. There
is a broad a firm ridge of breast tissue just medial to the
well-healed scar from the patient's reduction mammoplasty.

No suspicious masses or areas of shadowing are identified in the
lateral aspect of the left breasts which was scanned from the [DATE]
to [DATE] position.
IMPRESSION: 1. No mammographic or targeted sonographic correlate for the tender
palpable area of concern in the lateral left breast.

2.  No mammographic evidence of malignancy in the bilateral breasts.

RECOMMENDATION:
1. Clinical follow-up recommended for the palpable area of concern
in the left breast. Any further workup should be based on clinical
grounds.

2.  Screening mammogram in one year.(Code:[id number])

I have discussed the findings and recommendations with the patient.
Results were also provided in writing at the conclusion of the
visit. If applicable, a reminder letter will be sent to the patient
regarding the next appointment.

BI-RADS CATEGORY  1: Negative.

## 2019-05-24 ENCOUNTER — Other Ambulatory Visit (HOSPITAL_COMMUNITY)
Admission: RE | Admit: 2019-05-24 | Discharge: 2019-05-24 | Disposition: A | Discharge status: Home / Self Care | Payer: BLUE CROSS/BLUE SHIELD | Source: Ambulatory Visit | Attending: Obstetrics and Gynecology | Admitting: Obstetrics and Gynecology

## 2019-05-24 ENCOUNTER — Other Ambulatory Visit: Payer: Self-pay | Admitting: Obstetrics and Gynecology

## 2019-05-24 ENCOUNTER — Other Ambulatory Visit: Payer: Self-pay

## 2019-05-24 ENCOUNTER — Encounter: Payer: Self-pay | Admitting: Obstetrics and Gynecology

## 2019-05-24 ENCOUNTER — Ambulatory Visit (INDEPENDENT_AMBULATORY_CARE_PROVIDER_SITE_OTHER): Payer: BLUE CROSS/BLUE SHIELD | Admitting: Obstetrics and Gynecology

## 2019-05-24 VITALS — BP 128/86 | Ht 67.0 in | Wt 235.0 lb

## 2019-05-24 DIAGNOSIS — Z1231 Encounter for screening mammogram for malignant neoplasm of breast: Secondary | ICD-10-CM

## 2019-05-24 DIAGNOSIS — Z124 Encounter for screening for malignant neoplasm of cervix: Secondary | ICD-10-CM

## 2019-05-24 DIAGNOSIS — Z1331 Encounter for screening for depression: Secondary | ICD-10-CM

## 2019-05-24 DIAGNOSIS — Z1339 Encounter for screening examination for other mental health and behavioral disorders: Secondary | ICD-10-CM

## 2019-05-24 DIAGNOSIS — Z113 Encounter for screening for infections with a predominantly sexual mode of transmission: Secondary | ICD-10-CM

## 2019-05-24 DIAGNOSIS — Z01419 Encounter for gynecological examination (general) (routine) without abnormal findings: Secondary | ICD-10-CM | POA: Diagnosis present

## 2019-05-24 NOTE — Progress Notes (Signed)
Routine Annual Gynecology Examination   PCP: Trinna Post, PA-C  Chief Complaint  Patient presents with  . Annual Exam    History of Present Illness: Patient is a 53 y.o. G5P4 presents for annual exam. The patient has no complaints today.   Menstrua pattern: Irregular.  She has some months where she skips her periods and others where she has two in a month.    Menopausal symptoms: notes some hot flashes.  Cold and hot interolerance.  Breast symptoms: denies  Last pap smear: 2 years ago.  Result Normal  Last mammogram: nearly 2 years ago.  Result Normal  Past Medical History:  Diagnosis Date  . Anemia   . Depression   . Family history of ovarian cancer   . Hypertension   . 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 Vitamin B deficiency   . Vitamin D deficiency     Past Surgical History:  Procedure Laterality Date  . BREAST SURGERY  2002   Breast reduction -both  . CESAREAN SECTION  1990  . CHOLECYSTECTOMY  1991  . COLONOSCOPY WITH PROPOFOL N/A 12/21/2015   Procedure: COLONOSCOPY WITH PROPOFOL;  Surgeon: Lucilla Lame, MD;  Location: [facility];  Service: Endoscopy;  Laterality: N/A;  . HERNIA REPAIR  1980  . REDUCTION MAMMAPLASTY Bilateral 2000  . right shoulder surgery Right 11/07/15   shoulder scope;   . SHOULDER SURGERY Right 05/09/2009    Prior to Admission medications   Medication Sig Start Date End Date Taking? Authorizing Provider  cyanocobalamin 2000 MCG tablet Take 2,000 mcg by mouth daily.   Yes [provider]  ferrous sulfate 325 (65 FE) MG tablet Take 325 mg by mouth daily with breakfast.   Yes [provider]  lisinopril-hydrochlorothiazide (ZESTORETIC) 10-12.5 MG tablet Take 1 tablet by mouth daily. 03/19/19  Yes [provider]  PROAIR HFA 108 (90 Base) MCG/ACT inhaler INHALE 2 PUFFS EVERY 6 (SIX) HOURS AS NEEDED INTO THE LUNGS FOR WHEEZING OR SHORTNESS OF BREATH. 05/19/18  Yes Pollak, Adriana M, PA-C  Vitamin D,  Ergocalciferol, (DRISDOL) 50000 units CAPS capsule Take 1 capsule (50,000 Units total) by mouth every 7 (seven) days. 12/06/15  Yes Margarita Rana, MD  acetaminophen (TYLENOL) 500 MG tablet Take 500 mg by mouth every 6 (six) hours as needed.    [provider]  BIOTIN PO Take by mouth.    [provider]  Clarithromycin (BIAXIN PO) Take by mouth.    [provider]  meclizine (ANTIVERT) 12.5 MG tablet Take 1 tablet (12.5 mg total) by mouth 3 (three) times daily as needed for dizziness. Patient not taking: Reported on 05/24/2019 05/18/18   Chrismon, Vickki Muff, PA    Allergies  Allergen Reactions  . Scallops [Shellfish Allergy] Nausea And Vomiting    Obstetric History: G5P4  Social History   Socioeconomic History  . Marital status: Soil scientist    Spouse name: Not on file  . Number of children: Not on file  . Years of education: Not on file  . Highest education level: Not on file  Occupational History  . Not on file  Tobacco Use  . Smoking status: Current Every Day Smoker    Packs/day: 0.50    Years: 35.00    Pack years: 17.50  . Smokeless tobacco: Never Used  . Tobacco comment:    Substance and Sexual Activity  . Alcohol use: Yes    Alcohol/week: 2.0 standard drinks    Types: 2 Cans of beer  per week  . Drug use: No  . Sexual activity: Yes    Birth control/protection: None  Other Topics Concern  . Not on file  Social History Narrative  . Not on file   Social Determinants of Health   Financial Resource Strain:   . Difficulty of Paying Living Expenses: Not on file  Food Insecurity:   . Worried About Charity fundraiser in the Last Year: Not on file  . Ran Out of Food in the Last Year: Not on file  Transportation Needs:   . Lack of Transportation (Medical): Not on file  . Lack of Transportation (Non-Medical): Not on file  Physical Activity:   . Days of Exercise per Week: Not on file  . Minutes of Exercise per Session: Not on file    Stress:   . Feeling of Stress : Not on file  Social Connections:   . Frequency of Communication with Friends and Family: Not on file  . Frequency of Social Gatherings with Friends and Family: Not on file  . Attends Religious Services: Not on file  . Active Member of Clubs or Organizations: Not on file  . Attends Archivist Meetings: Not on file  . Marital Status: Not on file  Intimate Partner Violence:   . Fear of Current or Ex-Partner: Not on file  . Emotionally Abused: Not on file  . Physically Abused: Not on file  . Sexually Abused: Not on file    Family History  Problem Relation Age of Onset  . Hypertension Other   . Diabetes Other        Diabetes Mellitus type 2  . Diabetes Mother   . Ovarian cancer Maternal Grandmother 3  . Breast cancer Neg Hx     Review of Systems  Constitutional: Negative.   HENT: Negative.   Eyes: Negative.   Respiratory: Negative.   Cardiovascular: Negative.   Gastrointestinal: Negative.   Genitourinary: Negative.   Musculoskeletal: Negative.   Skin: Negative.   Neurological: Negative.   Psychiatric/Behavioral: Negative.      Physical Exam Vitals: BP 128/86   Ht 5\' 7"  (1.702 m)   Wt 235 lb (106.6 kg)   LMP 04/30/2019   BMI 36.81 kg/m   Physical Exam Constitutional:      General: She is not in acute distress.    Appearance: Normal appearance. She is well-developed.  Genitourinary:     Pelvic exam was performed with patient supine.     Vulva, urethra, bladder and uterus normal.     No inguinal adenopathy present in the right or left side.    No signs of injury in the vagina.     No vaginal discharge, erythema, tenderness or bleeding.     No cervical motion tenderness, discharge, lesion or polyp.     Uterus is mobile.     Uterus is not enlarged or tender.     No uterine mass detected.    Uterus is anteverted.     No right or left adnexal mass present.     Right adnexa not tender or full.     Left adnexa not tender  or full.  HENT:     Head: Normocephalic and atraumatic.  Eyes:     General: No scleral icterus.    Conjunctiva/sclera: Conjunctivae normal.  Neck:     Thyroid: No thyromegaly.  Cardiovascular:     Rate and Rhythm: Normal rate and regular rhythm.     Heart sounds: No murmur. No  friction rub. No gallop.   Pulmonary:     Effort: Pulmonary effort is normal. No respiratory distress.     Breath sounds: Normal breath sounds. No wheezing or rales.  Chest:     Breasts:        Right: No inverted nipple, mass, nipple discharge, skin change or tenderness.        Left: No inverted nipple, mass, nipple discharge, skin change or tenderness.  Abdominal:     General: Bowel sounds are normal. There is no distension.     Palpations: Abdomen is soft. There is no mass.     Tenderness: There is no abdominal tenderness. There is no guarding or rebound.  Musculoskeletal:        General: No swelling or tenderness. Normal range of motion.     Cervical back: Normal range of motion and neck supple.  Lymphadenopathy:     Cervical: No cervical adenopathy.     Lower Body: No right inguinal adenopathy. No left inguinal adenopathy.  Neurological:     General: No focal deficit present.     Mental Status: She is alert and oriented to person, place, and time.     Cranial Nerves: No cranial nerve deficit.  Skin:    General: Skin is warm and dry.     Findings: No erythema or rash.  Psychiatric:        Mood and Affect: Mood normal.        Behavior: Behavior normal.        Judgment: Judgment normal.      Female chaperone present for pelvic and breast  portions of the physical exam  Results: AUDIT Questionnaire (screen for alcoholism): 4 PHQ-9: 2   Assessment and Plan:  53 y.o. G67P4 female here for routine annual gynecologic examination  Plan: Problem List Items Addressed This Visit    None    Visit Diagnoses    Women's annual routine gynecological examination    -  Primary   Relevant Orders    Cytology - PAP   Screening for depression       Screening for alcoholism       Pap smear for cervical cancer screening       Relevant Orders   Cytology - PAP   Screen for STD (sexually transmitted disease)       Relevant Orders   Cytology - PAP      Screening: -- Blood pressure screen managed by PCP -- Colonoscopy - not due -- Mammogram - due. Patient to call [facility] to arrange. She understands that it is her responsibility to arrange this. -- Weight screening: overweight: continue to monitor -- Depression screening negative (PHQ-9) -- Nutrition: normal -- cholesterol screening: per PCP -- osteoporosis screening: not due -- tobacco screening: using: discussed quitting using the 5 A's -- alcohol screening: AUDIT questionnaire indicates low-risk usage. -- family history of breast cancer screening: done. not at high risk. -- no evidence of domestic violence or intimate partner violence. -- STD screening: gonorrhea/chlamydia NAAT collected -- pap smear collected per ASCCP guidelines -- flu vaccine received -- HPV vaccination series: not eligilbe   Prentice Docker, MD 05/24/2019 2:14 PM

## 2019-05-27 LAB — CYTOLOGY - PAP
Chlamydia: NEGATIVE
Comment: NEGATIVE
Comment: NEGATIVE
Comment: NORMAL
Diagnosis: NEGATIVE
High risk HPV: NEGATIVE
Neisseria Gonorrhea: NEGATIVE

## 2019-05-31 ENCOUNTER — Ambulatory Visit
Admission: RE | Admit: 2019-05-31 | Discharge: 2019-05-31 | Disposition: A | Discharge status: Home / Self Care | Payer: BLUE CROSS/BLUE SHIELD | Source: Ambulatory Visit | Attending: Obstetrics and Gynecology | Admitting: Obstetrics and Gynecology

## 2019-05-31 ENCOUNTER — Other Ambulatory Visit: Payer: Self-pay | Admitting: Obstetrics and Gynecology

## 2019-05-31 ENCOUNTER — Other Ambulatory Visit: Payer: Self-pay

## 2019-05-31 DIAGNOSIS — N6310 Unspecified lump in the right breast, unspecified quadrant: Secondary | ICD-10-CM

## 2019-05-31 DIAGNOSIS — Z1231 Encounter for screening mammogram for malignant neoplasm of breast: Secondary | ICD-10-CM | POA: Diagnosis not present

## 2019-05-31 DIAGNOSIS — R928 Other abnormal and inconclusive findings on diagnostic imaging of breast: Secondary | ICD-10-CM

## 2019-06-10 ENCOUNTER — Ambulatory Visit
Admission: RE | Admit: 2019-06-10 | Discharge: 2019-06-10 | Disposition: A | Discharge status: Home / Self Care | Payer: BLUE CROSS/BLUE SHIELD | Source: Ambulatory Visit | Attending: Obstetrics and Gynecology | Admitting: Obstetrics and Gynecology

## 2019-06-10 DIAGNOSIS — N6310 Unspecified lump in the right breast, unspecified quadrant: Secondary | ICD-10-CM

## 2019-06-10 DIAGNOSIS — R928 Other abnormal and inconclusive findings on diagnostic imaging of breast: Secondary | ICD-10-CM | POA: Insufficient documentation

## 2020-07-17 ENCOUNTER — Other Ambulatory Visit: Payer: Self-pay

## 2020-07-17 ENCOUNTER — Encounter: Payer: Self-pay | Admitting: Emergency Medicine

## 2020-07-17 ENCOUNTER — Emergency Department
Admission: EM | Admit: 2020-07-17 | Discharge: 2020-07-17 | Disposition: A | Discharge status: Home / Self Care | Payer: BLUE CROSS/BLUE SHIELD | Attending: Emergency Medicine | Admitting: Emergency Medicine

## 2020-07-17 ENCOUNTER — Emergency Department: Payer: BLUE CROSS/BLUE SHIELD

## 2020-07-17 DIAGNOSIS — Y9289 Other specified places as the place of occurrence of the external cause: Secondary | ICD-10-CM | POA: Diagnosis not present

## 2020-07-17 DIAGNOSIS — Y9389 Activity, other specified: Secondary | ICD-10-CM | POA: Diagnosis not present

## 2020-07-17 DIAGNOSIS — W001XXA Fall from stairs and steps due to ice and snow, initial encounter: Secondary | ICD-10-CM | POA: Diagnosis not present

## 2020-07-17 DIAGNOSIS — Y999 Unspecified external cause status: Secondary | ICD-10-CM | POA: Insufficient documentation

## 2020-07-17 DIAGNOSIS — S3992XA Unspecified injury of lower back, initial encounter: Secondary | ICD-10-CM | POA: Diagnosis not present

## 2020-07-17 DIAGNOSIS — I10 Essential (primary) hypertension: Secondary | ICD-10-CM | POA: Diagnosis not present

## 2020-07-17 DIAGNOSIS — W19XXXA Unspecified fall, initial encounter: Secondary | ICD-10-CM

## 2020-07-17 DIAGNOSIS — S0990XA Unspecified injury of head, initial encounter: Secondary | ICD-10-CM | POA: Diagnosis not present

## 2020-07-17 DIAGNOSIS — F17210 Nicotine dependence, cigarettes, uncomplicated: Secondary | ICD-10-CM | POA: Insufficient documentation

## 2020-07-17 DIAGNOSIS — Z79899 Other long term (current) drug therapy: Secondary | ICD-10-CM | POA: Diagnosis not present

## 2020-07-17 DIAGNOSIS — M25551 Pain in right hip: Secondary | ICD-10-CM | POA: Diagnosis present

## 2020-07-17 MED ORDER — METHOCARBAMOL 750 MG PO TABS: 750.0000 mg | ORAL_TABLET | Freq: Four times a day (QID) | ORAL | 0 refills | Status: AC | PRN

## 2020-07-17 MED ORDER — KETOROLAC TROMETHAMINE 60 MG/2ML IM SOLN
30.0000 mg | Freq: Once | INTRAMUSCULAR | Status: AC
  Administered 2020-07-17: 30 mg via INTRAMUSCULAR
  Filled 2020-07-17: qty 2

## 2020-07-17 MED ORDER — ACETAMINOPHEN 325 MG PO TABS
650.0000 mg | ORAL_TABLET | Freq: Once | ORAL | Status: AC
  Administered 2020-07-17: 650 mg via ORAL
  Filled 2020-07-17: qty 2

## 2020-07-17 MED ORDER — MELOXICAM 15 MG PO TABS: 15.0000 mg | ORAL_TABLET | Freq: Every day | ORAL | 0 refills | Status: AC

## 2020-07-17 MED ORDER — METHOCARBAMOL 500 MG PO TABS
750.0000 mg | ORAL_TABLET | Freq: Once | ORAL | Status: AC
  Administered 2020-07-17: 750 mg via ORAL
  Filled 2020-07-17: qty 2

## 2020-07-17 NOTE — ED Triage Notes (Signed)
Pt in after slip fall on icy porch this am. States she stepped onto first step, slid and landed backwards and onto R hip. Did hit head, has HA and primarily c/o R hip pain. No thinners, no LOC

## 2020-07-17 NOTE — ED Provider Notes (Signed)
[facility] Emergency Department Provider Note  ____________________________________________   Event Date/Time   First MD Initiated Contact with Patient 07/17/20 [phone]     (approximate)  I have reviewed the triage vital signs and the nursing notes.   HISTORY  Chief Complaint Fall, Head Injury, and Hip Pain  HPI Anna Bates is a 55 y.o. female who reports to the emergency department for evaluation after a slip and fall on icy porch this morning.  Patient states by she slipped on the first step, landing with her right hip and low back onto the stair and hit the back of her head.  She denies any loss of consciousness, is not on any blood thinners.  Denies any weakness since that time.  Reports the will significant amount of her pain has been isolated to the right buttocks and hip.  She has not tried any alleviating measures to this point.  Pain is currently rated a 3/10.         Past Medical History:  Diagnosis Date  . Anemia   . Depression   . Family history of ovarian cancer   . Hypertension   . Thrombocythemia   . Vitamin B deficiency   . Vitamin D deficiency     Patient Active Problem List   Diagnosis Date Noted  . Dysmenorrhea 04/29/2017  . Menorrhagia with regular cycle 01/23/2016  . Special screening for malignant neoplasms, colon   . Thrombocytosis 12/06/2015  . Leukocytosis 12/06/2015  . Vitamin B12 deficiency anemia 12/01/2014  . Blood glucose elevated 12/01/2014  . Avitaminosis D 12/01/2014  . GU infection, trichomonal 12/01/2014  . Irregular bleeding 12/01/2014  . Anemia, iron deficiency 12/01/2014  . Essential (primary) hypertension 05/03/2009  . Depression, neurotic 02/03/2009    Past Surgical History:  Procedure Laterality Date  . BREAST SURGERY  2002   Breast reduction -both  . CESAREAN SECTION  1990  . CHOLECYSTECTOMY  1991  . COLONOSCOPY WITH PROPOFOL N/A 12/21/2015   Procedure: COLONOSCOPY WITH PROPOFOL;   Surgeon: Lucilla Lame, MD;  Location: [place];  Service: Endoscopy;  Laterality: N/A;  . HERNIA REPAIR  1980  . REDUCTION MAMMAPLASTY Bilateral 2000  . right shoulder surgery Right 11/07/15   shoulder scope;   . SHOULDER SURGERY Right 05/09/2009    Prior to Admission medications   Medication Sig Start Date End Date Taking? Authorizing Provider  meloxicam (MOBIC) 15 MG tablet Take 1 tablet (15 mg total) by mouth daily for 15 days. 07/17/20 08/01/20 Yes Marlana Salvage, PA  methocarbamol (ROBAXIN-750) 750 MG tablet Take 1 tablet (750 mg total) by mouth 4 (four) times daily as needed for up to 10 days for muscle spasms. 07/17/20 07/27/20 Yes Marlana Salvage, PA  acetaminophen (TYLENOL) 500 MG tablet Take 500 mg by mouth every 6 (six) hours as needed.    [provider]  BIOTIN PO Take by mouth.    [provider]  Clarithromycin (BIAXIN PO) Take by mouth.    [provider]  cyanocobalamin 2000 MCG tablet Take 2,000 mcg by mouth daily.    [provider]  ferrous sulfate 325 (65 FE) MG tablet Take 325 mg by mouth daily with breakfast.    [provider]  lisinopril-hydrochlorothiazide (ZESTORETIC) 10-12.5 MG tablet Take 1 tablet by mouth daily. 03/19/19   [provider]  PROAIR HFA 108 (90 Base) MCG/ACT inhaler INHALE 2 PUFFS EVERY 6 (SIX) HOURS AS NEEDED INTO THE LUNGS FOR WHEEZING OR  SHORTNESS OF BREATH. 05/19/18   Trinna Post, PA-C  Vitamin D, Ergocalciferol, (DRISDOL) 50000 units CAPS capsule Take 1 capsule (50,000 Units total) by mouth every 7 (seven) days. 12/06/15   Margarita Rana, MD    Allergies Scallops [shellfish allergy]  Family History  Problem Relation Age of Onset  . Hypertension Other   . Diabetes Other        Diabetes Mellitus type 2  . Diabetes Mother   . Ovarian cancer Maternal Grandmother 16  . Breast cancer Neg Hx     Social History Social History   Tobacco Use  . Smoking status: Current  Every Day Smoker    Packs/day: 0.50    Years: 35.00    Pack years: 17.50  . Smokeless tobacco: Never Used  . Tobacco comment:    Vaping Use  . Vaping Use: Never used  Substance Use Topics  . Alcohol use: Yes    Alcohol/week: 2.0 standard drinks    Types: 2 Cans of beer per week  . Drug use: No    Review of Systems Constitutional: No fever/chills Eyes: No visual changes. ENT: No sore throat. Cardiovascular: Denies chest pain. Respiratory: Denies shortness of breath. Gastrointestinal: No abdominal pain.  No nausea, no vomiting.  No diarrhea.  No constipation. Genitourinary: Negative for dysuria. Musculoskeletal: + Right hip pain Skin: Negative for rash. Neurological: Negative for headaches, focal weakness or numbness.   ____________________________________________   PHYSICAL EXAM:  VITAL SIGNS: ED Triage Vitals  Enc Vitals Group     BP 07/17/20 0850 123/74     Pulse Rate 07/17/20 0850 77     Resp 07/17/20 0850 17     Temp 07/17/20 0850 98.6 F (37 C)     Temp Source 07/17/20 0850 Oral     SpO2 07/17/20 0850 99 %     Weight 07/17/20 0842 233 lb 11 oz (106 kg)     Height 07/17/20 1023 5\' 7"  (1.702 m)     Head Circumference --      Peak Flow --      Pain Score 07/17/20 1225 3     Pain Loc --      Pain Edu? --      Excl. in [place]? --     Constitutional: Alert and oriented. Well appearing and in no acute distress. Eyes: Conjunctivae are normal. PERRL. EOMI. Head: Atraumatic. Nose: No congestion/rhinnorhea. Mouth/Throat: Mucous membranes are moist.  Oropharynx non-erythematous. Neck: No stridor.  No tenderness to the midline or paraspinal of the cervical spine.  Normal range of motion. Cardiovascular: Normal rate, regular rhythm. Grossly normal heart sounds.  Good peripheral circulation. Respiratory: Normal respiratory effort.  No retractions. Lungs CTAB. Gastrointestinal: Soft and nontender. No distention. No abdominal bruits. No CVA tenderness. Musculoskeletal:  There is tenderness to palpation of the right SI joint, right paraspinal lumbar musculature and right greater trochanter.  There is no tenderness to the midline of the lumbar spine.  Negative logroll.  5/5 strength in bilateral plantarflexion, dorsiflexion, knee flexion extension, hip flexion.  Dorsal pedal pulse 2+ bilaterally. Neurologic:  Normal speech and language.  Cranial nerves II through XII grossly intact.  No gross focal neurologic deficits are appreciated.  Skin:  Skin is warm, dry and intact. No rash noted. Psychiatric: Mood and affect are normal. Speech and behavior are normal.   ____________________________________________  RADIOLOGY I, Marlana Salvage, personally viewed and evaluated these images (plain radiographs) as part of my medical decision making, as well as reviewing  the written report by the radiologist.  ED provider interpretation: No acute fracture identified.  Official radiology report(s): DG Lumbar Spine 2-3 Views  Result Date: 07/17/2020 CLINICAL DATA:  Right hip pain and low back pain after fall. EXAM: LUMBAR SPINE - 2-3 VIEW COMPARISON:  None. FINDINGS: Normal alignment of the lumbar spine. The vertebral body heights and disc spaces are maintained. Mild degenerative endplate changes. Symmetric appearance of the SI joints. Evidence for cholecystectomy clips. IMPRESSION: No acute abnormality in lumbar spine. Electronically Signed   By: Markus Daft M.D.   On: 07/17/2020 11:06   DG Hip Unilat W or Wo Pelvis 2-3 Views Right  Result Date: 07/17/2020 CLINICAL DATA:  Right hip pain status post fall earlier this morning. EXAM: DG HIP (WITH OR WITHOUT PELVIS) 2-3V RIGHT COMPARISON:  None. FINDINGS: There is no evidence of hip fracture or dislocation. There is no evidence of arthropathy or other focal bone abnormality. IMPRESSION: Negative. Electronically Signed   By: Jacqulynn Cadet M.D.   On: 07/17/2020 11:02    ____________________________________________   INITIAL  IMPRESSION / ASSESSMENT AND PLAN / ED COURSE  As part of my medical decision making, I reviewed the following data within the [facility] notes reviewed and incorporated, Radiograph reviewed and Notes from prior ED visits        Patient is a 55 year old female who presents to the emergency department after acute fall on her steps at home this morning.  See HPI for further details.  On physical exam, she does have tenderness to the right SI joint and right hip area.  However there were no acute neurovascular findings.  Patient also endorses hitting the back of her head, however she is neurologically intact and denies headache, dizziness, blurred vision.  X-rays were obtained of the low back and right hip and do not reveal any acute abnormalities.  Will treat for musculoskeletal pain with Toradol, Robaxin and Tylenol.  Patient is amenable with this plan and she is stable at time for outpatient therapy.  She will return with any acute worsening.      ____________________________________________   FINAL CLINICAL IMPRESSION(S) / ED DIAGNOSES  Final diagnoses:  Right hip pain  Injury of head, initial encounter  Fall, initial encounter     ED Discharge Orders         Ordered    meloxicam (MOBIC) 15 MG tablet  Daily        07/17/20 1157    methocarbamol (ROBAXIN-750) 750 MG tablet  4 times daily PRN        07/17/20 1157          *Please note:  Nataley Destefanis Doleman was evaluated in Emergency Department on 07/17/2020 for the symptoms described in the history of present illness. She was evaluated in the context of the global COVID-19 pandemic, which necessitated consideration that the patient might be at risk for infection with the SARS-CoV-2 virus that causes COVID-19. Institutional protocols and algorithms that pertain to the evaluation of patients at risk for COVID-19 are in a state of rapid change based on information released by regulatory bodies including the CDC  and federal and state organizations. These policies and algorithms were followed during the patient's care in the ED.  Some ED evaluations and interventions may be delayed as a result of limited staffing during and the pandemic.*   Note:  This document was prepared using Dragon voice recognition software and may include unintentional dictation errors.   Verne Carrow  J, PA 07/17/20 1844    Carrie Mew, MD 07/18/20 [phone]

## 2020-07-17 NOTE — ED Notes (Signed)
See triage note  Presents s/p fall  Having pain to right hip and states she did hit her head   No LOC  Slipped on ice

## 2020-07-17 NOTE — Discharge Instructions (Addendum)
Use meloxicam and robaxin as prescribed. You may also use 1000mg  of Tylenol 4x daily in addition to these medications.

## 2020-07-26 ENCOUNTER — Telehealth: Payer: Self-pay | Admitting: *Deleted

## 2020-07-26 DIAGNOSIS — Z122 Encounter for screening for malignant neoplasm of respiratory organs: Secondary | ICD-10-CM

## 2020-07-26 DIAGNOSIS — F17200 Nicotine dependence, unspecified, uncomplicated: Secondary | ICD-10-CM

## 2020-07-26 DIAGNOSIS — Z87891 Personal history of nicotine dependence: Secondary | ICD-10-CM

## 2020-07-26 NOTE — Telephone Encounter (Signed)
Received referral for initial lung cancer screening scan. Contacted patient and obtained smoking history,(current, 20 pack year) as well as answering questions related to screening process. Patient denies signs of lung cancer such as weight loss or hemoptysis. Patient denies comorbidity that would prevent curative treatment if lung cancer were found. Patient is scheduled for shared decision making visit and CT scan on 08/08/20 at 2pm.

## 2020-08-07 ENCOUNTER — Other Ambulatory Visit: Payer: Self-pay | Admitting: Internal Medicine

## 2020-08-07 DIAGNOSIS — Z1231 Encounter for screening mammogram for malignant neoplasm of breast: Secondary | ICD-10-CM

## 2020-08-08 ENCOUNTER — Ambulatory Visit
Admission: RE | Admit: 2020-08-08 | Discharge: 2020-08-08 | Disposition: A | Discharge status: Home / Self Care | Payer: BLUE CROSS/BLUE SHIELD | Source: Ambulatory Visit | Attending: Nurse Practitioner | Admitting: Nurse Practitioner

## 2020-08-08 ENCOUNTER — Other Ambulatory Visit: Payer: Self-pay

## 2020-08-08 ENCOUNTER — Encounter: Payer: Self-pay | Admitting: Nurse Practitioner

## 2020-08-08 ENCOUNTER — Inpatient Hospital Stay: Payer: BLUE CROSS/BLUE SHIELD | Attending: Nurse Practitioner | Admitting: Nurse Practitioner

## 2020-08-08 DIAGNOSIS — Z87891 Personal history of nicotine dependence: Secondary | ICD-10-CM

## 2020-08-08 DIAGNOSIS — Z122 Encounter for screening for malignant neoplasm of respiratory organs: Secondary | ICD-10-CM | POA: Diagnosis present

## 2020-08-08 DIAGNOSIS — F17200 Nicotine dependence, unspecified, uncomplicated: Secondary | ICD-10-CM | POA: Insufficient documentation

## 2020-08-08 DIAGNOSIS — F17210 Nicotine dependence, cigarettes, uncomplicated: Secondary | ICD-10-CM

## 2020-08-08 NOTE — Progress Notes (Signed)
Virtual Visit via Video Enabled Telemedicine Note   I connected with Anna Bates on 08/08/20 at 2:00 PM EST by video enabled telemedicine visit and verified that I am speaking with the correct person using two identifiers.   I discussed the limitations, risks, security and privacy concerns of performing an evaluation and management service by telemedicine and the availability of in-person appointments. I also discussed with the patient that there may be a patient responsible charge related to this service. The patient expressed understanding and agreed to proceed.   Other persons participating in the visit and their role in the encounter: Burgess Estelle, RN- checking in patient & navigation  Patient's location: [place]  Provider's location: Clinic  Chief Complaint: Low Dose CT Screening  Patient agreed to evaluation by telemedicine to discuss shared decision making for consideration of low dose CT lung cancer screening.    In accordance with CMS guidelines, patient has met eligibility criteria including age, absence of signs or symptoms of lung cancer.  Social History   Tobacco Use  . Smoking status: Current Every Day Smoker    Packs/day: 0.50    Years: 40.00    Pack years: 20.00    Types: Cigarettes  . Smokeless tobacco: Never Used  . Tobacco comment:    Substance Use Topics  . Alcohol use: Yes    Alcohol/week: 2.0 standard drinks    Types: 2 Cans of beer per week     A shared decision-making session was conducted prior to the performance of CT scan. This includes one or more decision aids, includes benefits and harms of screening, follow-up diagnostic testing, over-diagnosis, false positive rate, and total radiation exposure.   Counseling on the importance of adherence to annual lung cancer LDCT screening, impact of co-morbidities, and ability or willingness to undergo diagnosis and treatment is imperative for compliance of the program.   Counseling on the  importance of continued smoking cessation for former smokers; the importance of smoking cessation for current smokers, and information about tobacco cessation interventions have been given to patient including [place] and [employer] [place] programs.   Written order for lung cancer screening with LDCT has been given to the patient and any and all questions have been answered to the best of my abilities.    Yearly follow up will be coordinated by Burgess Estelle, Thoracic Navigator.  I discussed the assessment and treatment plan with the patient. The patient was provided an opportunity to ask questions and all were answered. The patient agreed with the plan and demonstrated an understanding of the instructions.   The patient was advised to call back or seek an in-person evaluation if the symptoms worsen or if the condition fails to improve as anticipated.   I provided 15 minutes of face-to-face video visit time dedicated to the care of this patient on the date of this encounter to include pre-visit review of smoking history, face-to-face time with the patient, and post visit ordering of testing/documentation.   Beckey Rutter, DNP, AGNP-C [place] at [facility] [phone] (clinic)

## 2020-08-10 ENCOUNTER — Telehealth: Payer: Self-pay | Admitting: *Deleted

## 2020-08-10 ENCOUNTER — Other Ambulatory Visit: Payer: Self-pay | Admitting: *Deleted

## 2020-08-10 DIAGNOSIS — R911 Solitary pulmonary nodule: Secondary | ICD-10-CM

## 2020-08-10 NOTE — Telephone Encounter (Signed)
After reviewing information with [facility], notified patient of LDCT lung cancer screening program results with recommendation for 3 month follow up imaging vs PET scan. Also notified of incidental findings noted below and is encouraged to discuss further with PCP who will receive a copy of this note and/or the CT report. Patient verbalizes understanding. Appt. Given for Pet scan 08/24/20 8am arrival. Patient is in agreement with this plan.   IMPRESSION: 1. Lung-RADS 4A, suspicious. 12.9 mm subsolid nodular opacity in the paraspinal left costophrenic sulcus, potentially infectious/inflammatory or scarring. Follow up low-dose chest CT without contrast in 3 months (please use the following order, "CT CHEST LCS NODULE FOLLOW-UP W/O CM") is recommended. Alternatively, PET may be considered when there is a solid component 56mm or larger. 2.  Aortic Atherosclerois (ICD10-170.0)  These results will be called to the ordering clinician or representative by the Radiologist Assistant, and communication documented in the PACS or [employer].

## 2020-08-21 ENCOUNTER — Other Ambulatory Visit: Payer: Self-pay

## 2020-08-21 ENCOUNTER — Ambulatory Visit
Admission: RE | Admit: 2020-08-21 | Discharge: 2020-08-21 | Disposition: A | Discharge status: Home / Self Care | Payer: BLUE CROSS/BLUE SHIELD | Source: Ambulatory Visit | Attending: Internal Medicine | Admitting: Internal Medicine

## 2020-08-21 DIAGNOSIS — Z1231 Encounter for screening mammogram for malignant neoplasm of breast: Secondary | ICD-10-CM | POA: Diagnosis present

## 2020-08-24 ENCOUNTER — Ambulatory Visit
Admission: RE | Admit: 2020-08-24 | Discharge: 2020-08-24 | Disposition: A | Discharge status: Home / Self Care | Payer: BLUE CROSS/BLUE SHIELD | Source: Ambulatory Visit | Attending: Nurse Practitioner | Admitting: Nurse Practitioner

## 2020-08-24 ENCOUNTER — Other Ambulatory Visit: Payer: Self-pay

## 2020-08-24 DIAGNOSIS — R911 Solitary pulmonary nodule: Secondary | ICD-10-CM | POA: Diagnosis present

## 2020-08-24 LAB — GLUCOSE, CAPILLARY: Glucose-Capillary: 96 mg/dL (ref 70–99)

## 2020-08-24 MED ORDER — FLUDEOXYGLUCOSE F - 18 (FDG) INJECTION
12.1000 | Freq: Once | INTRAVENOUS | Status: AC | PRN
  Administered 2020-08-24: 12.826 via INTRAVENOUS

## 2020-08-25 ENCOUNTER — Encounter: Payer: Self-pay | Admitting: *Deleted

## 2020-10-25 ENCOUNTER — Other Ambulatory Visit
Admission: RE | Admit: 2020-10-25 | Discharge: 2020-10-25 | Disposition: A | Discharge status: Home / Self Care | Payer: BLUE CROSS/BLUE SHIELD | Source: Ambulatory Visit | Attending: Internal Medicine | Admitting: Internal Medicine

## 2020-10-25 DIAGNOSIS — M79662 Pain in left lower leg: Secondary | ICD-10-CM | POA: Insufficient documentation

## 2020-10-25 DIAGNOSIS — M7989 Other specified soft tissue disorders: Secondary | ICD-10-CM | POA: Insufficient documentation

## 2020-10-25 LAB — D-DIMER, QUANTITATIVE: D-Dimer, Quant: <0.27 ug/mL-FEU (ref 0.00–0.50)

## 2021-04-23 ENCOUNTER — Other Ambulatory Visit: Payer: BLUE CROSS/BLUE SHIELD

## 2021-04-23 ENCOUNTER — Encounter: Payer: Self-pay | Admitting: Hematology and Oncology

## 2021-04-24 ENCOUNTER — Ambulatory Visit (LOCAL_COMMUNITY_HEALTH_CENTER): Payer: Self-pay

## 2021-04-24 ENCOUNTER — Other Ambulatory Visit: Payer: Self-pay

## 2021-04-24 DIAGNOSIS — Z111 Encounter for screening for respiratory tuberculosis: Secondary | ICD-10-CM

## 2021-04-24 NOTE — Progress Notes (Signed)
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 Pt reports she had "reaction" to ppd in 1993 while living in [place]. Took 9 mo of Rifampin. Had negative chest xray. Has no records with her or details. Pt explains she has had ppd's since that time that have been negative. Most recent ppd 2015 and negative. PPD placed today. To return 04/27/2021 for PPDR. Josie Saunders, RN

## 2021-04-27 ENCOUNTER — Other Ambulatory Visit: Payer: BLUE CROSS/BLUE SHIELD

## 2021-07-26 ENCOUNTER — Telehealth: Payer: Self-pay | Admitting: Acute Care

## 2021-07-26 NOTE — Telephone Encounter (Signed)
Left voicemail and call back number to schedule annual LDCT °

## 2021-08-28 NOTE — Telephone Encounter (Signed)
Returned call to patient to schedule annual LDCT.  No answer.  Left VM with call back information. ?

## 2021-10-01 ENCOUNTER — Other Ambulatory Visit: Payer: Self-pay | Admitting: Internal Medicine

## 2021-10-01 ENCOUNTER — Other Ambulatory Visit: Payer: Self-pay

## 2021-10-01 DIAGNOSIS — Z122 Encounter for screening for malignant neoplasm of respiratory organs: Secondary | ICD-10-CM

## 2021-10-01 DIAGNOSIS — Z1231 Encounter for screening mammogram for malignant neoplasm of breast: Secondary | ICD-10-CM

## 2021-10-01 DIAGNOSIS — F17210 Nicotine dependence, cigarettes, uncomplicated: Secondary | ICD-10-CM

## 2021-10-01 DIAGNOSIS — Z87891 Personal history of nicotine dependence: Secondary | ICD-10-CM

## 2021-10-12 ENCOUNTER — Encounter: Payer: Self-pay | Admitting: *Deleted

## 2021-10-15 ENCOUNTER — Other Ambulatory Visit: Payer: Self-pay

## 2021-10-15 ENCOUNTER — Ambulatory Visit
Admission: RE | Admit: 2021-10-15 | Discharge: 2021-10-15 | Disposition: A | Discharge status: Home / Self Care | Payer: BLUE CROSS/BLUE SHIELD | Source: Ambulatory Visit | Attending: Internal Medicine | Admitting: Internal Medicine

## 2021-10-15 DIAGNOSIS — F17210 Nicotine dependence, cigarettes, uncomplicated: Secondary | ICD-10-CM | POA: Diagnosis present

## 2021-10-15 DIAGNOSIS — Z87891 Personal history of nicotine dependence: Secondary | ICD-10-CM | POA: Diagnosis present

## 2021-10-15 DIAGNOSIS — Z122 Encounter for screening for malignant neoplasm of respiratory organs: Secondary | ICD-10-CM | POA: Diagnosis present

## 2021-10-25 ENCOUNTER — Other Ambulatory Visit: Payer: Self-pay

## 2021-10-25 DIAGNOSIS — Z87891 Personal history of nicotine dependence: Secondary | ICD-10-CM

## 2021-10-25 DIAGNOSIS — Z122 Encounter for screening for malignant neoplasm of respiratory organs: Secondary | ICD-10-CM

## 2021-10-25 DIAGNOSIS — F17210 Nicotine dependence, cigarettes, uncomplicated: Secondary | ICD-10-CM

## 2021-11-12 ENCOUNTER — Ambulatory Visit
Admission: RE | Admit: 2021-11-12 | Discharge: 2021-11-12 | Disposition: A | Discharge status: Home / Self Care | Payer: BLUE CROSS/BLUE SHIELD | Source: Ambulatory Visit | Attending: Internal Medicine | Admitting: Internal Medicine

## 2021-11-12 DIAGNOSIS — Z1231 Encounter for screening mammogram for malignant neoplasm of breast: Secondary | ICD-10-CM | POA: Diagnosis present

## 2022-03-13 ENCOUNTER — Other Ambulatory Visit: Payer: Self-pay | Admitting: Internal Medicine

## 2022-03-13 DIAGNOSIS — N6489 Other specified disorders of breast: Secondary | ICD-10-CM

## 2022-03-13 DIAGNOSIS — N6320 Unspecified lump in the left breast, unspecified quadrant: Secondary | ICD-10-CM

## 2022-03-29 ENCOUNTER — Ambulatory Visit
Admission: RE | Admit: 2022-03-29 | Discharge: 2022-03-29 | Disposition: A | Discharge status: Home / Self Care | Payer: BC Managed Care – PPO | Source: Ambulatory Visit | Attending: Internal Medicine | Admitting: Internal Medicine

## 2022-03-29 ENCOUNTER — Encounter: Payer: Self-pay | Admitting: Hematology and Oncology

## 2022-03-29 DIAGNOSIS — N6489 Other specified disorders of breast: Secondary | ICD-10-CM | POA: Diagnosis present

## 2022-03-29 DIAGNOSIS — N6320 Unspecified lump in the left breast, unspecified quadrant: Secondary | ICD-10-CM | POA: Diagnosis present

## 2022-10-17 ENCOUNTER — Other Ambulatory Visit: Payer: Self-pay | Admitting: Internal Medicine

## 2022-10-17 ENCOUNTER — Ambulatory Visit: Payer: BC Managed Care – PPO

## 2022-10-17 DIAGNOSIS — Z1231 Encounter for screening mammogram for malignant neoplasm of breast: Secondary | ICD-10-CM

## 2022-10-23 ENCOUNTER — Ambulatory Visit
Admission: RE | Admit: 2022-10-23 | Discharge: 2022-10-23 | Disposition: A | Discharge status: Home / Self Care | Payer: BC Managed Care – PPO | Source: Ambulatory Visit | Attending: Acute Care | Admitting: Acute Care

## 2022-10-23 DIAGNOSIS — F17210 Nicotine dependence, cigarettes, uncomplicated: Secondary | ICD-10-CM | POA: Diagnosis present

## 2022-10-23 DIAGNOSIS — Z87891 Personal history of nicotine dependence: Secondary | ICD-10-CM | POA: Insufficient documentation

## 2022-10-23 DIAGNOSIS — Z122 Encounter for screening for malignant neoplasm of respiratory organs: Secondary | ICD-10-CM | POA: Insufficient documentation

## 2022-11-21 ENCOUNTER — Ambulatory Visit
Admission: RE | Admit: 2022-11-21 | Discharge: 2022-11-21 | Disposition: A | Discharge status: Home / Self Care | Payer: BC Managed Care – PPO | Source: Ambulatory Visit | Attending: Internal Medicine | Admitting: Internal Medicine

## 2022-11-21 DIAGNOSIS — Z1231 Encounter for screening mammogram for malignant neoplasm of breast: Secondary | ICD-10-CM | POA: Insufficient documentation

## 2022-11-27 ENCOUNTER — Other Ambulatory Visit: Payer: Self-pay | Admitting: Internal Medicine

## 2022-11-27 DIAGNOSIS — R928 Other abnormal and inconclusive findings on diagnostic imaging of breast: Secondary | ICD-10-CM

## 2022-11-27 DIAGNOSIS — N6489 Other specified disorders of breast: Secondary | ICD-10-CM

## 2022-12-06 ENCOUNTER — Other Ambulatory Visit: Payer: BC Managed Care – PPO

## 2022-12-09 ENCOUNTER — Ambulatory Visit
Admission: RE | Admit: 2022-12-09 | Discharge: 2022-12-09 | Disposition: A | Discharge status: Home / Self Care | Payer: BC Managed Care – PPO | Source: Ambulatory Visit | Attending: Internal Medicine | Admitting: Internal Medicine

## 2022-12-09 DIAGNOSIS — R928 Other abnormal and inconclusive findings on diagnostic imaging of breast: Secondary | ICD-10-CM | POA: Diagnosis present

## 2022-12-09 DIAGNOSIS — N6489 Other specified disorders of breast: Secondary | ICD-10-CM

## 2022-12-19 ENCOUNTER — Other Ambulatory Visit: Payer: Self-pay | Admitting: *Deleted

## 2022-12-19 DIAGNOSIS — Z87891 Personal history of nicotine dependence: Secondary | ICD-10-CM

## 2022-12-19 DIAGNOSIS — Z122 Encounter for screening for malignant neoplasm of respiratory organs: Secondary | ICD-10-CM

## 2022-12-19 DIAGNOSIS — F17210 Nicotine dependence, cigarettes, uncomplicated: Secondary | ICD-10-CM

## 2023-06-25 ENCOUNTER — Other Ambulatory Visit: Payer: Self-pay | Admitting: Medical Genetics

## 2023-10-22 ENCOUNTER — Other Ambulatory Visit: Payer: Self-pay | Admitting: Internal Medicine

## 2023-10-22 DIAGNOSIS — I517 Cardiomegaly: Secondary | ICD-10-CM

## 2023-10-22 DIAGNOSIS — Z87891 Personal history of nicotine dependence: Secondary | ICD-10-CM

## 2023-10-28 ENCOUNTER — Ambulatory Visit
Admission: RE | Admit: 2023-10-28 | Discharge: 2023-10-28 | Disposition: A | Discharge status: Home / Self Care | Source: Ambulatory Visit | Attending: Internal Medicine | Admitting: Internal Medicine

## 2023-10-28 DIAGNOSIS — Z87891 Personal history of nicotine dependence: Secondary | ICD-10-CM | POA: Insufficient documentation

## 2023-10-28 DIAGNOSIS — I517 Cardiomegaly: Secondary | ICD-10-CM | POA: Insufficient documentation

## 2024-03-11 ENCOUNTER — Other Ambulatory Visit: Payer: Self-pay | Admitting: Obstetrics and Gynecology

## 2024-03-11 DIAGNOSIS — Z1231 Encounter for screening mammogram for malignant neoplasm of breast: Secondary | ICD-10-CM

## 2024-04-05 ENCOUNTER — Other Ambulatory Visit: Payer: Self-pay | Admitting: Medical Genetics

## 2024-04-05 DIAGNOSIS — Z006 Encounter for examination for normal comparison and control in clinical research program: Secondary | ICD-10-CM

## 2024-04-07 ENCOUNTER — Ambulatory Visit
Admission: RE | Admit: 2024-04-07 | Discharge: 2024-04-07 | Disposition: A | Discharge status: Home / Self Care | Source: Ambulatory Visit

## 2024-04-07 VITALS — BP 134/78 | HR 59 | Temp 98.5°F | Resp 20

## 2024-04-07 DIAGNOSIS — J0100 Acute maxillary sinusitis, unspecified: Secondary | ICD-10-CM

## 2024-04-07 MED ORDER — AMOXICILLIN-POT CLAVULANATE 875-125 MG PO TABS: 1.0000 | ORAL_TABLET | Freq: Two times a day (BID) | ORAL | 0 refills | Status: AC

## 2024-04-07 MED ORDER — PREDNISONE 10 MG (21) PO TBPK: ORAL_TABLET | Freq: Every day | ORAL | 0 refills | Status: AC

## 2024-04-07 MED ORDER — IPRATROPIUM BROMIDE 0.03 % NA SOLN: 2.0000 | Freq: Two times a day (BID) | NASAL | 0 refills | Status: AC

## 2024-04-07 NOTE — ED Provider Notes (Signed)
 Anna Bates    CSN: [id number] Arrival date & time: 04/07/24  9056      History   Chief Complaint Chief Complaint  Patient presents with   Sore Throat    Sinus pressure, throbbing left ear, decreased hearing in ear. This has increased since flying over the weekend - Entered by patient   Otalgia    HPI Anna Bates is a 58 y.o. female.   Patient presents for evaluation of intermittent headaches, nasal congestion, sinus pressure to the bridge of the nose, right sided ear pain present for 9 days.  Ear pain progressively worsening after recent travel, described as throbbing and pulsating with decreased hearing.  Known sick contacts prior.  Has attempted Zicam.  Tolerable to food or liquids.  Denies fever, cough.  Past Medical History:  Diagnosis Date   Anemia    Depression    Family history of ovarian cancer    Hypertension    Thrombocythemia    Vitamin B deficiency    Vitamin D  deficiency     Patient Active Problem List   Diagnosis Date Noted   Dysmenorrhea 04/29/2017   Menorrhagia with regular cycle 01/23/2016   Special screening for malignant neoplasms, colon    Thrombocytosis 12/06/2015   Leukocytosis 12/06/2015   Vitamin B12 deficiency anemia 12/01/2014   Blood glucose elevated 12/01/2014   Avitaminosis D 12/01/2014   GU infection, trichomonal 12/01/2014   Irregular bleeding 12/01/2014   Anemia, iron deficiency 12/01/2014   Essential (primary) hypertension 05/03/2009   Depression, neurotic 02/03/2009    Past Surgical History:  Procedure Laterality Date   BREAST SURGERY  2002   Breast reduction -both   CESAREAN SECTION  1990   CHOLECYSTECTOMY  1991   COLONOSCOPY WITH PROPOFOL  N/A 12/21/2015   Procedure: COLONOSCOPY WITH PROPOFOL ;  Surgeon: Rogelia Copping, MD;  Location: [facility];  Service: Endoscopy;  Laterality: N/A;   HERNIA REPAIR  1980   REDUCTION MAMMAPLASTY Bilateral 2000   right shoulder surgery Right 11/07/15   shoulder  scope;    SHOULDER SURGERY Right 05/09/2009    OB History     Gravida  5   Para  4   Term      Preterm      AB      Living         SAB      IAB      Ectopic      Multiple      Live Births               Home Medications    Prior to Admission medications   Medication Sig Start Date End Date Taking? Authorizing Provider  amoxicillin -clavulanate (AUGMENTIN ) 875-125 MG tablet Take 1 tablet by mouth every 12 (twelve) hours. 04/07/24  Yes Shellby Schlink R, NP  ipratropium (ATROVENT) 0.03 % nasal spray Place 2 sprays into both nostrils every 12 (twelve) hours. 04/07/24  Yes Denver Harder R, NP  losartan-hydrochlorothiazide (HYZAAR) 50-12.5 MG tablet Take 1 tablet by mouth daily. 02/19/24  Yes [provider]  meloxicam  (MOBIC ) 15 MG tablet Take 15 mg by mouth daily. 02/20/24  Yes [provider]  predniSONE  (STERAPRED UNI-PAK 21 TAB) 10 MG (21) TBPK tablet Take by mouth daily. Take 6 tabs by mouth daily  for 1 days, then 5 tabs for 1 days, then 4 tabs for 1 days, then 3 tabs for 1 days, 2 tabs for 1 days, then 1 tab by mouth daily for  1 days 04/07/24  Yes Tomorrow Dehaas R, NP  acetaminophen  (TYLENOL ) 500 MG tablet Take 500 mg by mouth every 6 (six) hours as needed.    [provider]  BIOTIN PO Take by mouth.    [provider]  Clarithromycin (BIAXIN PO) Take by mouth.    [provider]  cyanocobalamin  2000 MCG tablet Take 2,000 mcg by mouth daily.    [provider]  ferrous sulfate 325 (65 FE) MG tablet Take 325 mg by mouth daily with breakfast.    [provider]  lisinopril-hydrochlorothiazide (ZESTORETIC) 10-12.5 MG tablet Take 1 tablet by mouth daily. Patient not taking: Reported on 04/24/2021 03/19/19   [provider]  PROAIR  HFA 108 (90 Base) MCG/ACT inhaler INHALE 2 PUFFS EVERY 6 (SIX) HOURS AS NEEDED INTO THE LUNGS FOR WHEEZING OR SHORTNESS OF BREATH. 05/19/18   Ashok Ferrier M, PA-C   propranolol (INDERAL) 20 MG tablet Take 20 mg by mouth 2 (two) times daily.    [provider]  Vitamin D , Ergocalciferol , (DRISDOL ) 50000 units CAPS capsule Take 1 capsule (50,000 Units total) by mouth every 7 (seven) days. 12/06/15   Agapito Mom, MD    Family History Family History  Problem Relation Age of Onset   Hypertension Other    Diabetes Other        Diabetes Mellitus type 2   Diabetes Mother    Ovarian cancer Maternal Grandmother 24   Breast cancer Neg Hx     Social History Social History   Tobacco Use   Smoking status: Every Day    Current packs/day: 0.50    Average packs/day: 0.5 packs/day for 40.0 years (20.0 ttl pk-yrs)    Types: Cigarettes   Smokeless tobacco: Never   Tobacco comments:       Vaping Use   Vaping status: Never Used  Substance Use Topics   Alcohol use: Yes    Alcohol/week: 2.0 standard drinks of alcohol    Types: 2 Cans of beer per week   Drug use: No     Allergies   Scallops [shellfish allergy]   Review of Systems Review of Systems   Physical Exam Triage Vital Signs ED Triage Vitals  Encounter Vitals Group     BP 04/07/24 1005 134/78     Girls Systolic BP Percentile --      Girls Diastolic BP Percentile --      Boys Systolic BP Percentile --      Boys Diastolic BP Percentile --      Pulse Rate 04/07/24 1005 (!) 59     Resp 04/07/24 1005 20     Temp 04/07/24 1005 98.5 F (36.9 C)     Temp Source 04/07/24 1005 Oral     SpO2 04/07/24 1005 98 %     Weight --      Height --      Head Circumference --      Peak Flow --      Pain Score 04/07/24 1008 8     Pain Loc --      Pain Education --      Exclude from Growth Chart --    No data found.  Updated Vital Signs BP 134/78 (BP Location: Left Arm)   Pulse (!) 59   Temp 98.5 F (36.9 C) (Oral)   Resp 20   LMP 05/31/2019   SpO2 98%   Visual Acuity Right Eye Distance:   Left Eye Distance:   Bilateral Distance:  Right Eye Near:   Left Eye Near:     Bilateral Near:     Physical Exam Constitutional:      Appearance: Normal appearance.  HENT:     Right Ear: Tympanic membrane, ear canal and external ear normal.     Left Ear: Tympanic membrane, ear canal and external ear normal.     Nose: Congestion present.     Mouth/Throat:     Pharynx: No oropharyngeal exudate or posterior oropharyngeal erythema.  Eyes:     Extraocular Movements: Extraocular movements intact.  Cardiovascular:     Rate and Rhythm: Normal rate and regular rhythm.     Pulses: Normal pulses.     Heart sounds: Normal heart sounds.  Pulmonary:     Effort: Pulmonary effort is normal.     Breath sounds: Normal breath sounds.  Neurological:     Mental Status: She is alert and oriented to person, place, and time.      UC Treatments / Results  Labs (all labs ordered are listed, but only abnormal results are displayed) Labs Reviewed - No data to display  EKG   Radiology No results found.  Procedures Procedures (including critical care time)  Medications Ordered in UC Medications - No data to display  Initial Impression / Assessment and Plan / UC Course  I have reviewed the triage vital signs and the nursing notes.  Pertinent labs & imaging results that were available during my care of the patient were reviewed by me and considered in my medical decision making (see chart for details).   Acute nonrecurrent maxillary sinusitis  Patient is in no signs of distress nor toxic appearing.  Vital signs are stable.  Low suspicion for pneumonia, pneumothorax or bronchitis and therefore will defer imaging.  Symptoms most consistent with a sinusitis, completed home testing for COVID and flu which were negative.  Persisting for 9 days without resolve and per patient worsening therefore initiating antibiotics, prescribed Augmentin  and additionally prescribed prednisone  and Atrovent nasal spray for supportive care.May use additional over-the-counter medications as needed  for supportive care.  May follow-up with urgent care as needed if symptoms persist or worsen.  Note given.   Final Clinical Impressions(s) / UC Diagnoses   Final diagnoses:  Acute non-recurrent maxillary sinusitis     Discharge Instructions      Today you are being treated for sinus infection most likely causing your ear pain and sore throat  Begin Augmentin  twice daily for 7 days as symptoms are persistent 9 days without any improvement  Begin prednisone  every morning with food to reduce sinus pain and inflammation, avoid ibuprofen while taking but may use Tylenol   You may use nasal spray to further help reduce sinus pressure, may be used twice daily    You can take Tylenol   as needed for fever reduction and pain relief.   For cough: honey 1/2 to 1 teaspoon (you can dilute the honey in water  or another fluid).  You can also use guaifenesin and dextromethorphan for cough. You can use a humidifier for chest congestion and cough.  If you don't have a humidifier, you can sit in the bathroom with the hot shower running.      For sore throat: try warm salt water  gargles, cepacol lozenges, throat spray, warm tea or water  with lemon/honey, popsicles or ice, or OTC cold relief medicine for throat discomfort.   For congestion: take a daily anti-histamine like Zyrtec, Claritin, and a oral decongestant, such as pseudoephedrine.  You can also use Flonase 1-2 sprays in each nostril daily.   It is important to stay hydrated: drink plenty of fluids (water , gatorade/powerade/pedialyte, juices, or teas) to keep your throat moisturized and help further relieve irritation/discomfort.    ED Prescriptions     Medication Sig Dispense Auth. Provider   amoxicillin -clavulanate (AUGMENTIN ) 875-125 MG tablet Take 1 tablet by mouth every 12 (twelve) hours. 14 tablet Raphael Fitzpatrick R, NP   predniSONE  (STERAPRED UNI-PAK 21 TAB) 10 MG (21) TBPK tablet Take by mouth daily. Take 6 tabs by mouth daily  for 1  days, then 5 tabs for 1 days, then 4 tabs for 1 days, then 3 tabs for 1 days, 2 tabs for 1 days, then 1 tab by mouth daily for 1 days 21 tablet Icyss Skog R, NP   ipratropium (ATROVENT) 0.03 % nasal spray Place 2 sprays into both nostrils every 12 (twelve) hours. 30 mL Teresa Shelba SAUNDERS, NP      PDMP not reviewed this encounter.   Teresa Shelba SAUNDERS, NP 04/07/24 1032

## 2024-04-07 NOTE — Discharge Instructions (Signed)
 Today you are being treated for sinus infection most likely causing your ear pain and sore throat  Begin Augmentin  twice daily for 7 days as symptoms are persistent 9 days without any improvement  Begin prednisone  every morning with food to reduce sinus pain and inflammation, avoid ibuprofen while taking but may use Tylenol   You may use nasal spray to further help reduce sinus pressure, may be used twice daily    You can take Tylenol   as needed for fever reduction and pain relief.   For cough: honey 1/2 to 1 teaspoon (you can dilute the honey in water  or another fluid).  You can also use guaifenesin and dextromethorphan for cough. You can use a humidifier for chest congestion and cough.  If you don't have a humidifier, you can sit in the bathroom with the hot shower running.      For sore throat: try warm salt water  gargles, cepacol lozenges, throat spray, warm tea or water  with lemon/honey, popsicles or ice, or OTC cold relief medicine for throat discomfort.   For congestion: take a daily anti-histamine like Zyrtec, Claritin, and a oral decongestant, such as pseudoephedrine.  You can also use Flonase 1-2 sprays in each nostril daily.   It is important to stay hydrated: drink plenty of fluids (water , gatorade/powerade/pedialyte, juices, or teas) to keep your throat moisturized and help further relieve irritation/discomfort.

## 2024-04-07 NOTE — ED Triage Notes (Signed)
 Patient reports sore throat x 10 days and patient has Zicam and Vitamin C with mild relief. Patient also complains of  pain in right ear and decrease hearing and increased after flying last weekend. Rates sore throat 4/10 and right ear pain 8/10.

## 2024-05-03 ENCOUNTER — Ambulatory Visit

## 2024-05-18 IMAGING — MG MM DIGITAL SCREENING BILAT W/ TOMO AND CAD
8 series · 8 of 24 positions shown · non-contrast
Comparison: Previous exam(s).

CLINICAL DATA: Screening.

EXAM:
DIGITAL SCREENING BILATERAL MAMMOGRAM WITH TOMOSYNTHESIS AND CAD
TECHNIQUE: Bilateral screening digital craniocaudal and mediolateral oblique
mammograms were obtained. Bilateral screening digital breast
tomosynthesis was performed. The images were evaluated with
computer-aided detection.

[L MLO synth-2D]
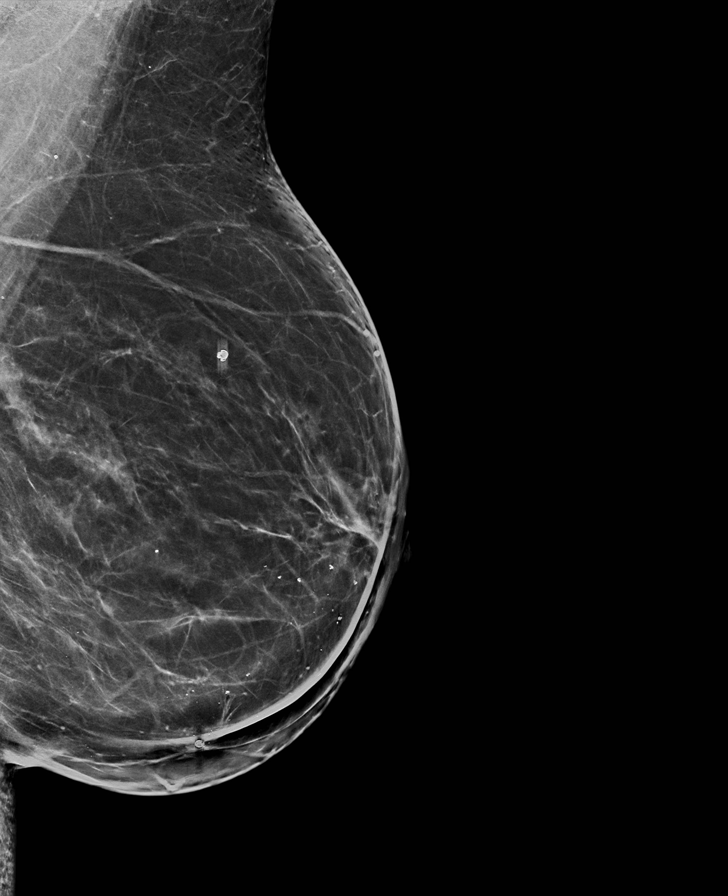

[R CC synth-2D]
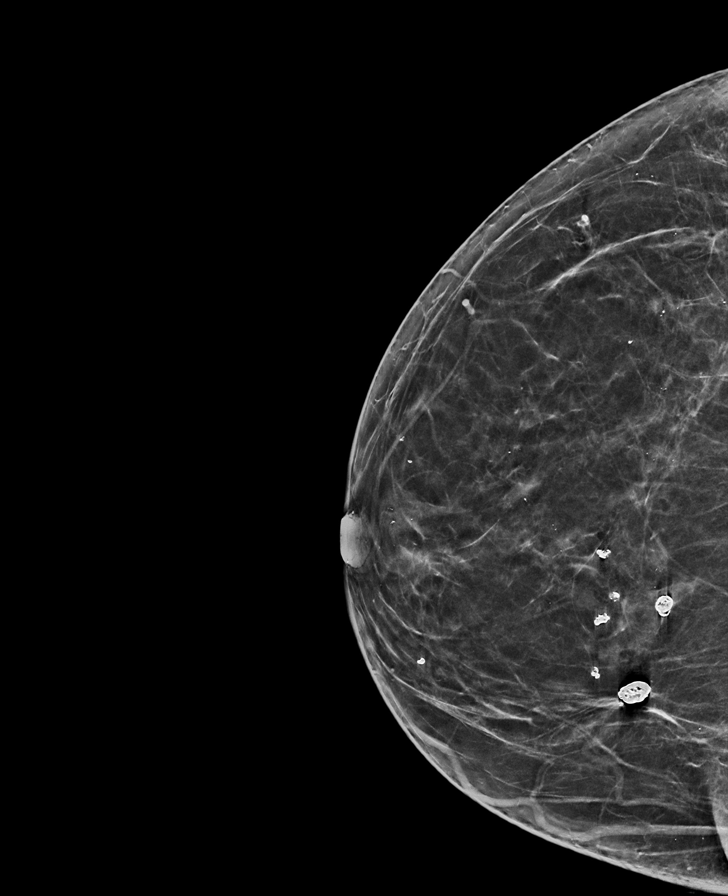

[L CC synth-2D]
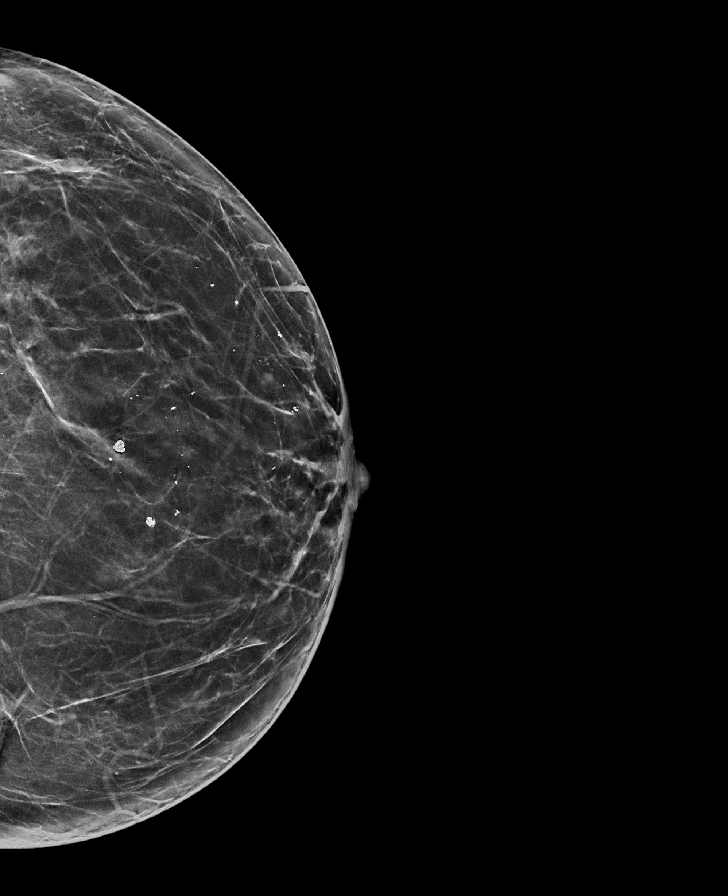

[R MLO synth-2D]
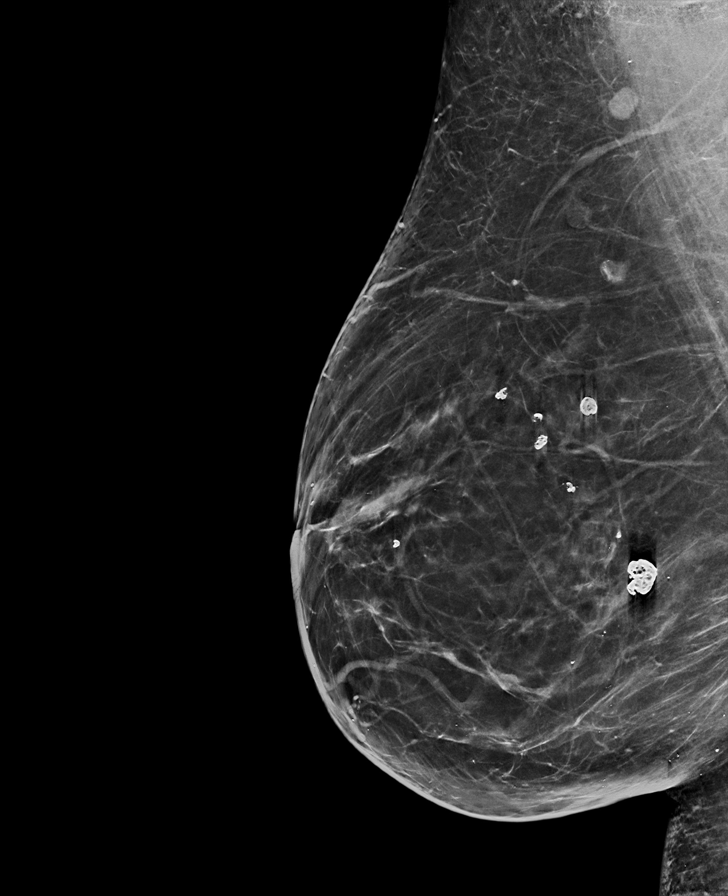

[R CC tomo · tomo slice 32/63.0]
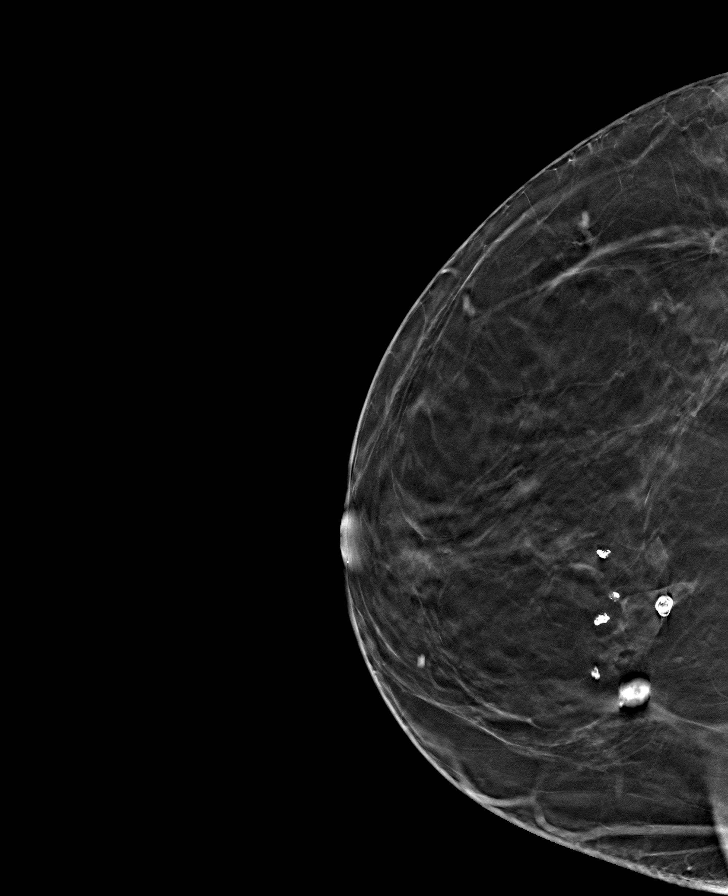

[L MLO tomo · tomo slice 40/79.0]
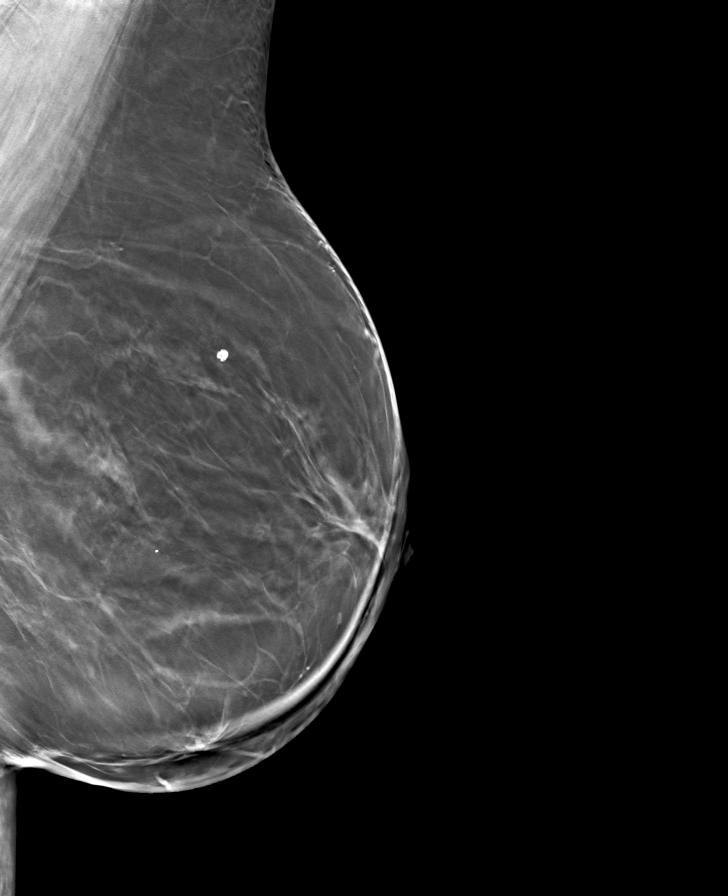

[R MLO tomo · tomo slice 41/80.0]
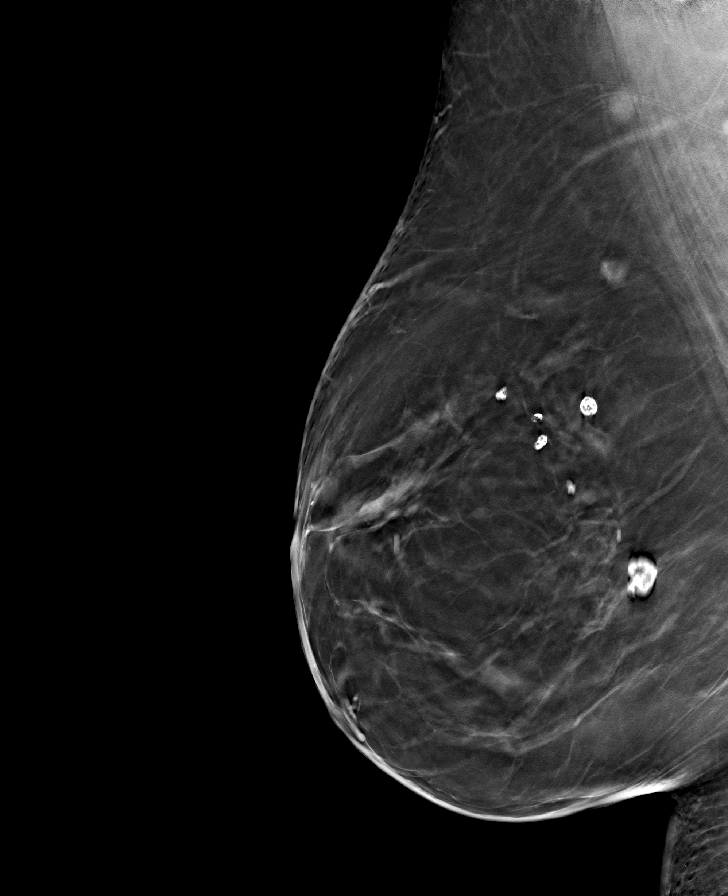

[L CC tomo · tomo slice 34/67.0]
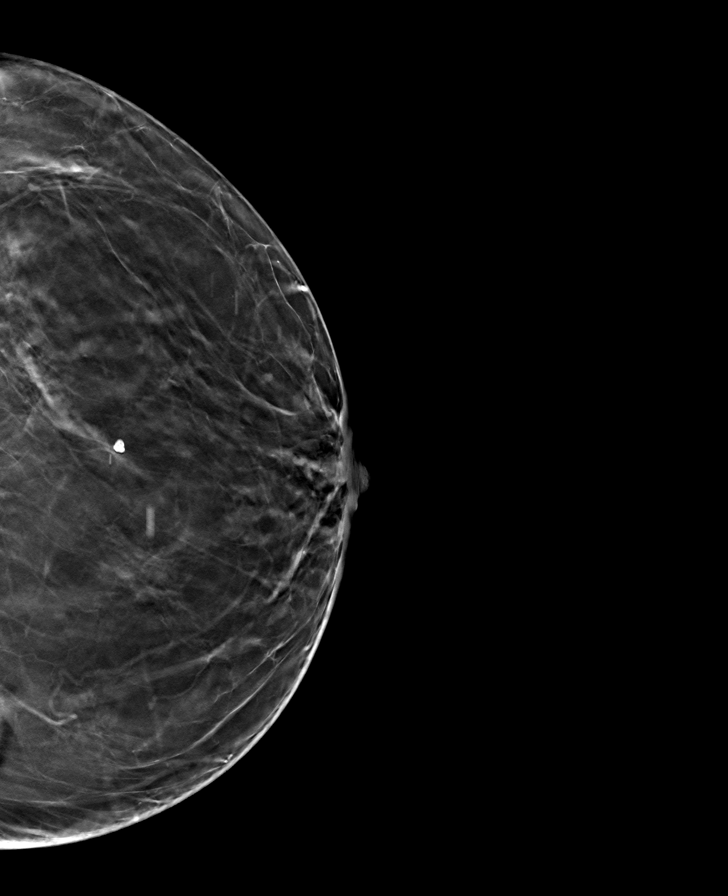

[8 of 24 positions shown; findings below may reference images not displayed]

ACR Breast Density Category b: There are scattered areas of
fibroglandular density.
FINDINGS: There are no findings suspicious for malignancy.
IMPRESSION: No mammographic evidence of malignancy. A result letter of this
screening mammogram will be mailed directly to the patient.

RECOMMENDATION:
Screening mammogram in one year. (Code:51-O-LD2)

BI-RADS CATEGORY  1: Negative.

## 2024-06-10 ENCOUNTER — Encounter: Payer: Self-pay | Admitting: Hematology and Oncology

## 2024-06-17 ENCOUNTER — Encounter: Payer: Self-pay | Admitting: Hematology and Oncology

## 2024-06-17 ENCOUNTER — Ambulatory Visit
Admission: RE | Admit: 2024-06-17 | Discharge: 2024-06-17 | Disposition: A | Discharge status: Home / Self Care | Payer: Self-pay | Source: Ambulatory Visit | Attending: Obstetrics and Gynecology | Admitting: Obstetrics and Gynecology

## 2024-06-17 DIAGNOSIS — Z1231 Encounter for screening mammogram for malignant neoplasm of breast: Secondary | ICD-10-CM | POA: Diagnosis present

## 2024-06-25 ENCOUNTER — Ambulatory Visit: Payer: Self-pay | Admitting: Obstetrics and Gynecology
